# Patient Record
Sex: Male | Born: 1965
Health system: Southern US, Community
[De-identification: ages and names within clinical notes are randomized; demographics above are authoritative.]

## PROBLEM LIST (undated history)

## (undated) DIAGNOSIS — K219 Gastro-esophageal reflux disease without esophagitis: Secondary | ICD-10-CM

## (undated) DIAGNOSIS — I82409 Acute embolism and thrombosis of unspecified deep veins of unspecified lower extremity: Secondary | ICD-10-CM

## (undated) DIAGNOSIS — I10 Essential (primary) hypertension: Secondary | ICD-10-CM

## (undated) HISTORY — DX: Essential (primary) hypertension: I10

## (undated) HISTORY — DX: Gastro-esophageal reflux disease without esophagitis: K21.9

## (undated) HISTORY — DX: Acute embolism and thrombosis of unspecified deep veins of unspecified lower extremity: I82.409

## (undated) HISTORY — PX: EYE SURGERY: SHX253

---

## 2001-11-29 ENCOUNTER — Encounter: Payer: Self-pay | Admitting: Internal Medicine

## 2001-11-29 ENCOUNTER — Ambulatory Visit (HOSPITAL_COMMUNITY): Admission: RE | Admit: 2001-11-29 | Discharge: 2001-11-29 | Payer: Self-pay | Admitting: Internal Medicine

## 2001-12-02 ENCOUNTER — Ambulatory Visit (HOSPITAL_COMMUNITY): Admission: RE | Admit: 2001-12-02 | Discharge: 2001-12-02 | Payer: Self-pay | Admitting: Internal Medicine

## 2001-12-02 ENCOUNTER — Encounter: Payer: Self-pay | Admitting: Internal Medicine

## 2002-07-25 ENCOUNTER — Ambulatory Visit (HOSPITAL_COMMUNITY): Admission: RE | Admit: 2002-07-25 | Discharge: 2002-07-25 | Payer: Self-pay | Admitting: Internal Medicine

## 2002-07-25 ENCOUNTER — Encounter: Payer: Self-pay | Admitting: Internal Medicine

## 2004-11-05 ENCOUNTER — Emergency Department (HOSPITAL_COMMUNITY): Admission: EM | Admit: 2004-11-05 | Discharge: 2004-11-05 | Payer: Self-pay | Admitting: Emergency Medicine

## 2004-12-30 ENCOUNTER — Encounter: Payer: Self-pay | Admitting: Orthopedic Surgery

## 2004-12-30 ENCOUNTER — Emergency Department (HOSPITAL_COMMUNITY): Admission: EM | Admit: 2004-12-30 | Discharge: 2004-12-30 | Payer: Self-pay | Admitting: Emergency Medicine

## 2005-01-01 ENCOUNTER — Ambulatory Visit: Payer: Self-pay | Admitting: Orthopedic Surgery

## 2005-01-14 ENCOUNTER — Ambulatory Visit: Payer: Self-pay | Admitting: Orthopedic Surgery

## 2005-01-20 ENCOUNTER — Encounter (HOSPITAL_COMMUNITY): Admission: RE | Admit: 2005-01-20 | Discharge: 2005-02-19 | Payer: Self-pay | Admitting: Orthopedic Surgery

## 2005-01-28 ENCOUNTER — Ambulatory Visit: Payer: Self-pay | Admitting: Orthopedic Surgery

## 2009-02-27 ENCOUNTER — Ambulatory Visit: Payer: Self-pay | Admitting: Orthopedic Surgery

## 2009-02-27 ENCOUNTER — Encounter (INDEPENDENT_AMBULATORY_CARE_PROVIDER_SITE_OTHER): Payer: Self-pay | Admitting: *Deleted

## 2009-02-27 DIAGNOSIS — IMO0002 Reserved for concepts with insufficient information to code with codable children: Secondary | ICD-10-CM

## 2009-03-01 ENCOUNTER — Encounter: Payer: Self-pay | Admitting: Orthopedic Surgery

## 2009-03-04 ENCOUNTER — Telehealth: Payer: Self-pay | Admitting: Orthopedic Surgery

## 2009-03-05 ENCOUNTER — Ambulatory Visit: Payer: Self-pay | Admitting: Orthopedic Surgery

## 2009-03-22 ENCOUNTER — Encounter: Payer: Self-pay | Admitting: Orthopedic Surgery

## 2009-03-26 ENCOUNTER — Encounter: Payer: Self-pay | Admitting: Orthopedic Surgery

## 2009-08-26 ENCOUNTER — Ambulatory Visit (HOSPITAL_COMMUNITY): Admission: RE | Admit: 2009-08-26 | Discharge: 2009-08-26 | Payer: Self-pay | Admitting: Internal Medicine

## 2009-08-26 ENCOUNTER — Encounter: Payer: Self-pay | Admitting: Orthopedic Surgery

## 2009-08-27 ENCOUNTER — Ambulatory Visit: Payer: Self-pay | Admitting: Orthopedic Surgery

## 2009-08-27 DIAGNOSIS — S92009A Unspecified fracture of unspecified calcaneus, initial encounter for closed fracture: Secondary | ICD-10-CM

## 2009-08-30 ENCOUNTER — Telehealth: Payer: Self-pay | Admitting: Orthopedic Surgery

## 2009-08-30 ENCOUNTER — Encounter: Payer: Self-pay | Admitting: Orthopedic Surgery

## 2009-09-03 ENCOUNTER — Ambulatory Visit (HOSPITAL_COMMUNITY): Admission: RE | Admit: 2009-09-03 | Discharge: 2009-09-03 | Payer: Self-pay | Admitting: Orthopedic Surgery

## 2009-09-10 ENCOUNTER — Ambulatory Visit: Payer: Self-pay | Admitting: Orthopedic Surgery

## 2009-09-10 DIAGNOSIS — S93409A Sprain of unspecified ligament of unspecified ankle, initial encounter: Secondary | ICD-10-CM | POA: Insufficient documentation

## 2009-09-11 ENCOUNTER — Encounter: Payer: Self-pay | Admitting: Orthopedic Surgery

## 2009-09-24 ENCOUNTER — Ambulatory Visit: Payer: Self-pay | Admitting: Orthopedic Surgery

## 2009-11-01 ENCOUNTER — Encounter: Payer: Self-pay | Admitting: Orthopedic Surgery

## 2009-11-08 ENCOUNTER — Encounter: Payer: Self-pay | Admitting: Orthopedic Surgery

## 2010-10-28 NOTE — Letter (Signed)
Summary: [employer] response claim ref  [facility] [employer] Medical response claim ref   Imported By: Cammie Sickle 03/22/2010 13:19:32  _____________________________________________________________________  External Attachment:    Type:   Image     Comment:   External Document

## 2010-10-28 NOTE — Letter (Signed)
Summary: [employer] disability ins form  [employer] disability ins form   Imported By: Cammie Sickle 01/20/2010 08:59:40  _____________________________________________________________________  External Attachment:    Type:   Image     Comment:   External Document

## 2010-10-28 NOTE — Letter (Signed)
Summary: [employer] disability insurance form  [employer] disability insurance form   Imported By: Cammie Sickle 11/21/2009 10:53:41  _____________________________________________________________________  External Attachment:    Type:   Image     Comment:   External Document

## 2011-01-13 IMAGING — CT CT EXTREM LOW W/O CM*R*
2 of 4 series · 7 of 35 positions shown, 8 images · non-contrast
Comparison: Right ankle radiographs 08/26/2009.

CLINICAL DATA: Right ankle and foot pain since injury 08/23/2009.

CT OF THE RIGHT FOOT WITHOUT CONTRAST
TECHNIQUE: Multidetector CT imaging of the right foot and ankle
was performed according to the standard protocol without
intravenous contrast. Multiplanar CT image reconstructions were
also generated.

[Series 12: axial to pacs 2.0 · coronal · 0.26mm/px · 3 of 144 slices shown]
[im 41/144  bone]
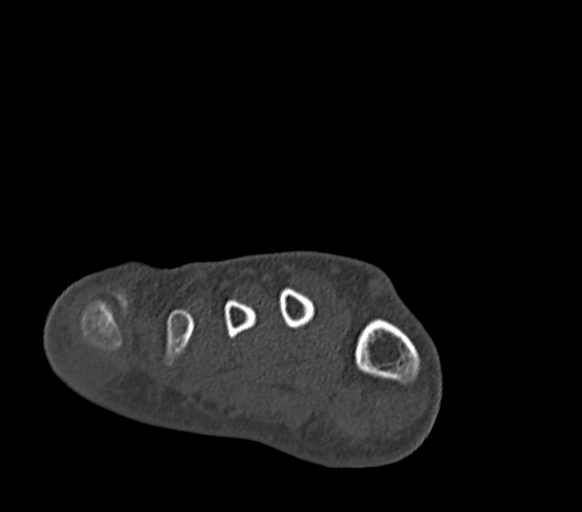
[im 72/144  bone]
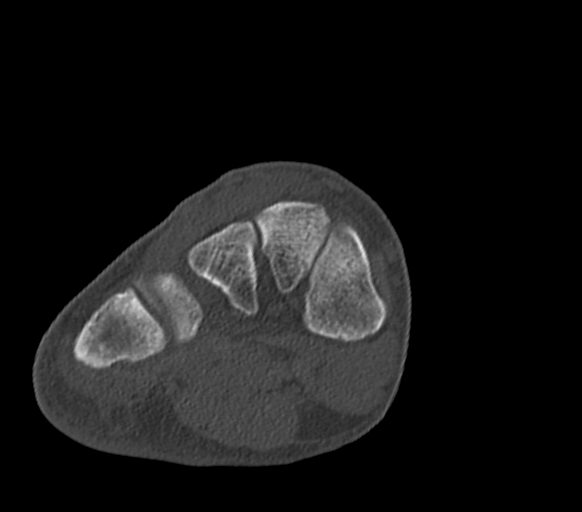
[im 104/144  bone]
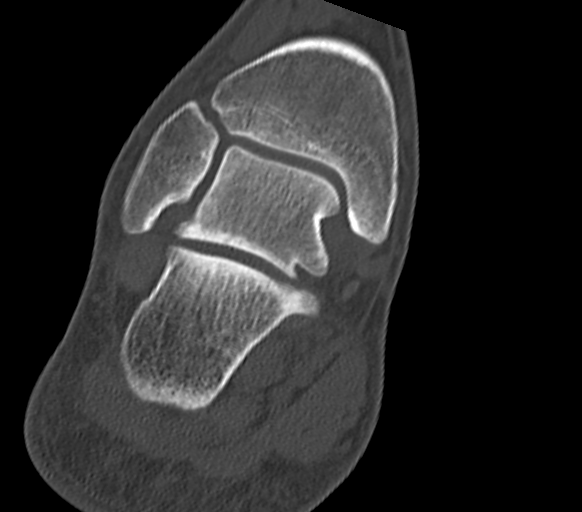

[Series 14: mpr coro bone to pacs 2.0 · axial · 0.20mm/px · z∈[-239,-165]mm · 4 of 61 slices shown, 5 images]
[im 10/61  soft-tissue]
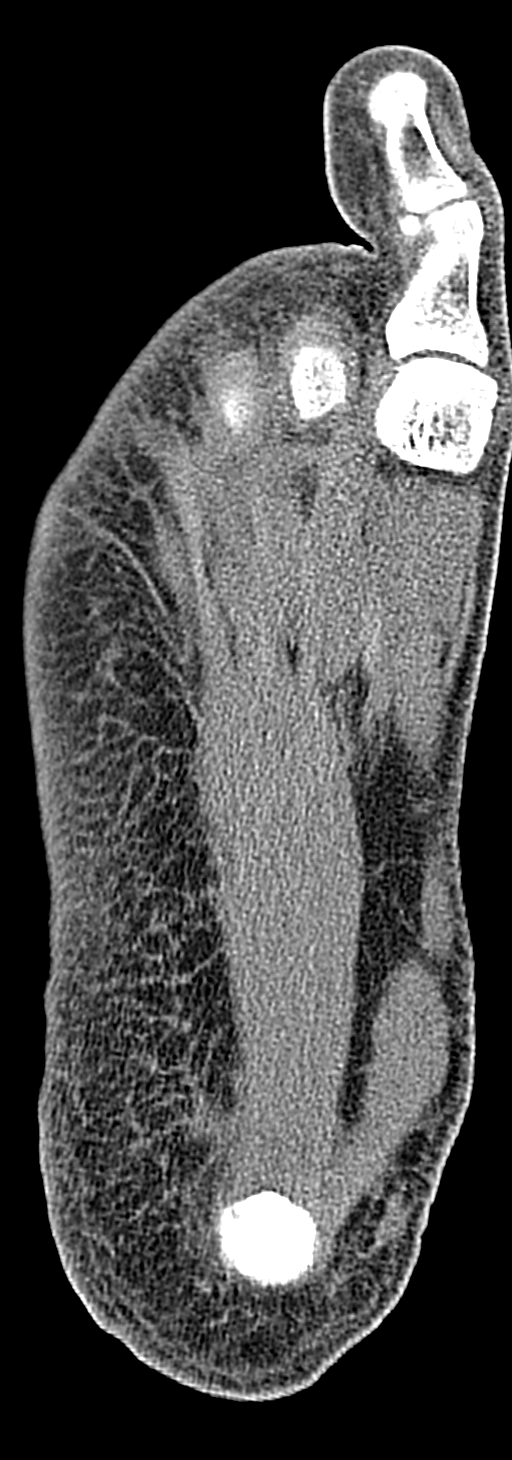
[im 10/61  bone]
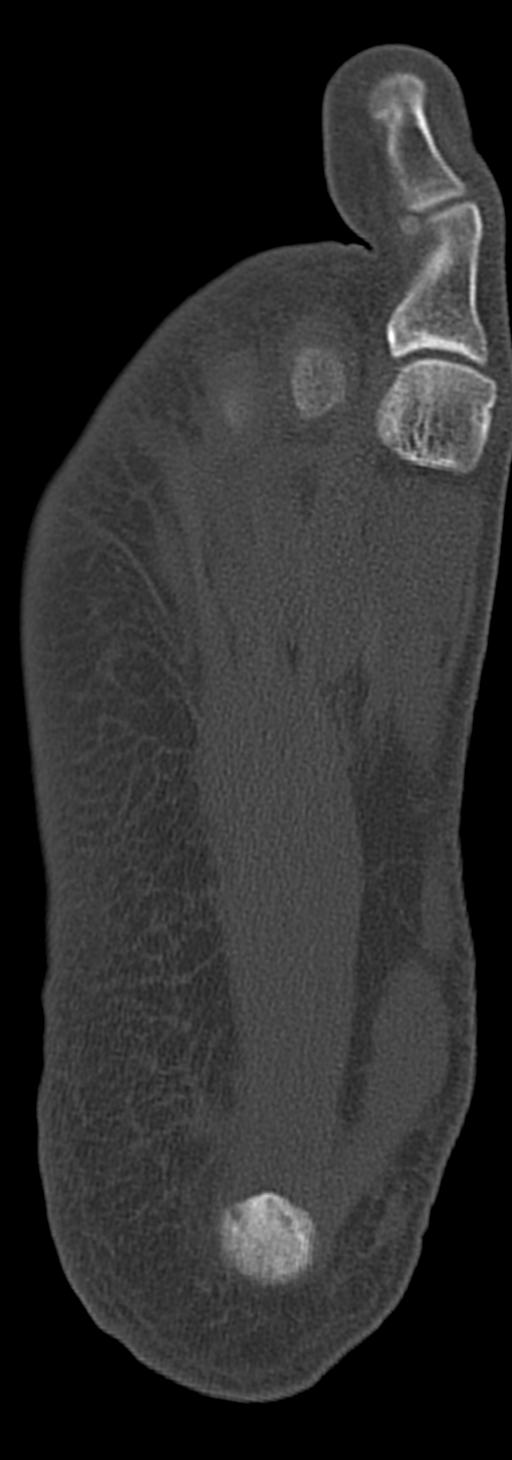
[im 24/61  bone]
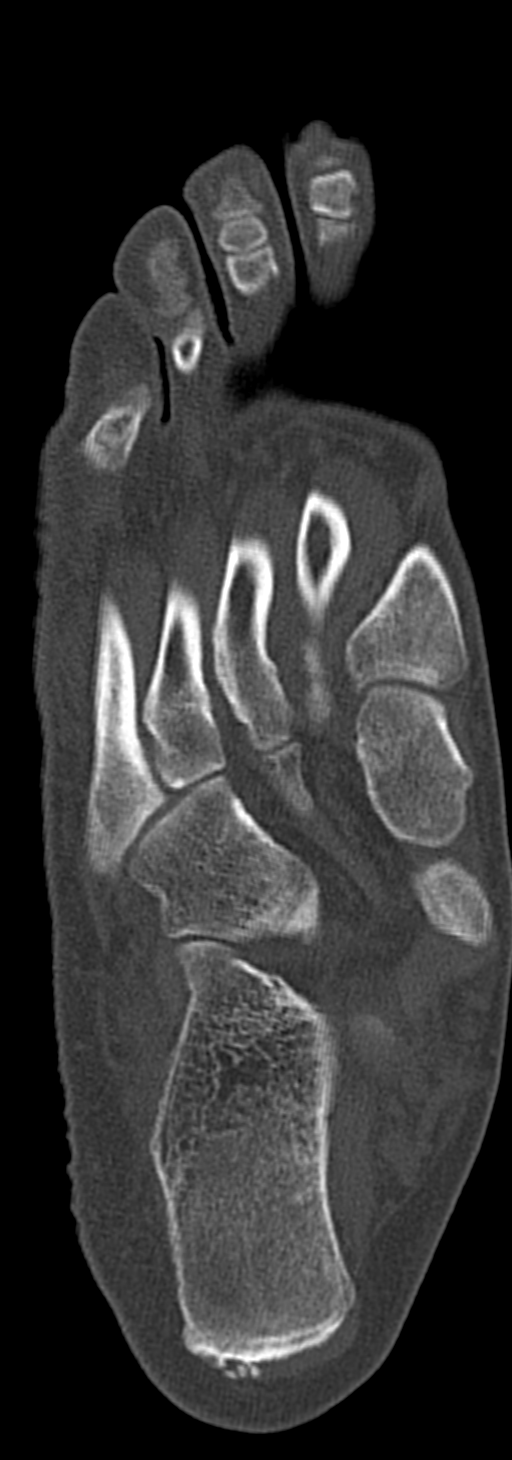
[im 37/61  bone]
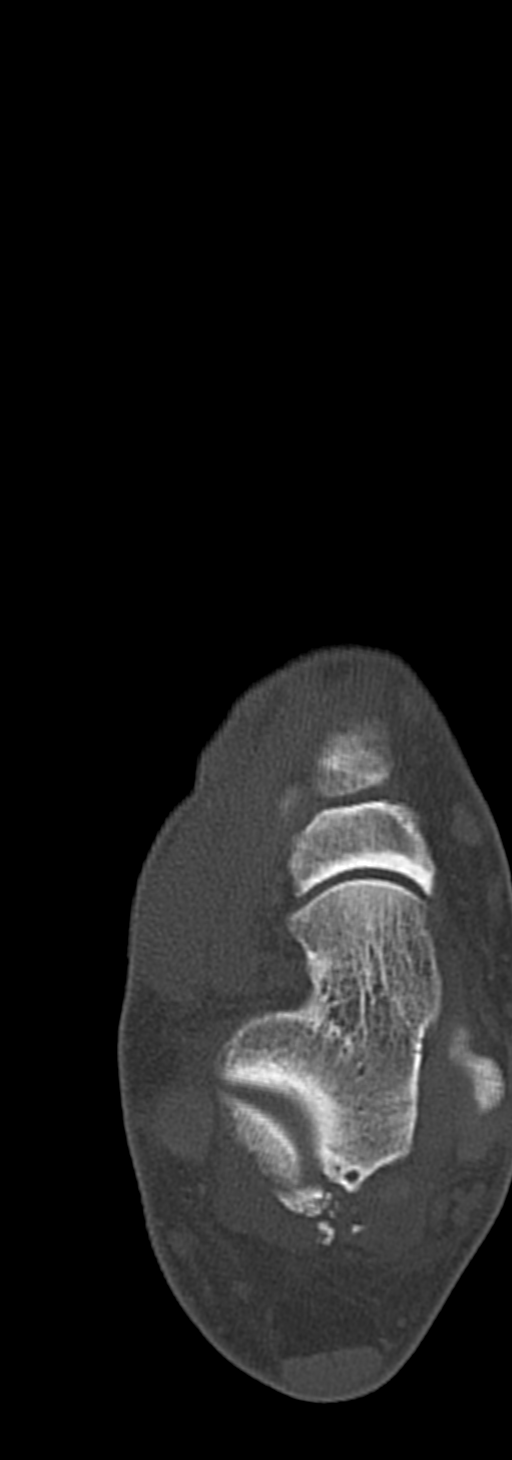
[im 51/61  bone]
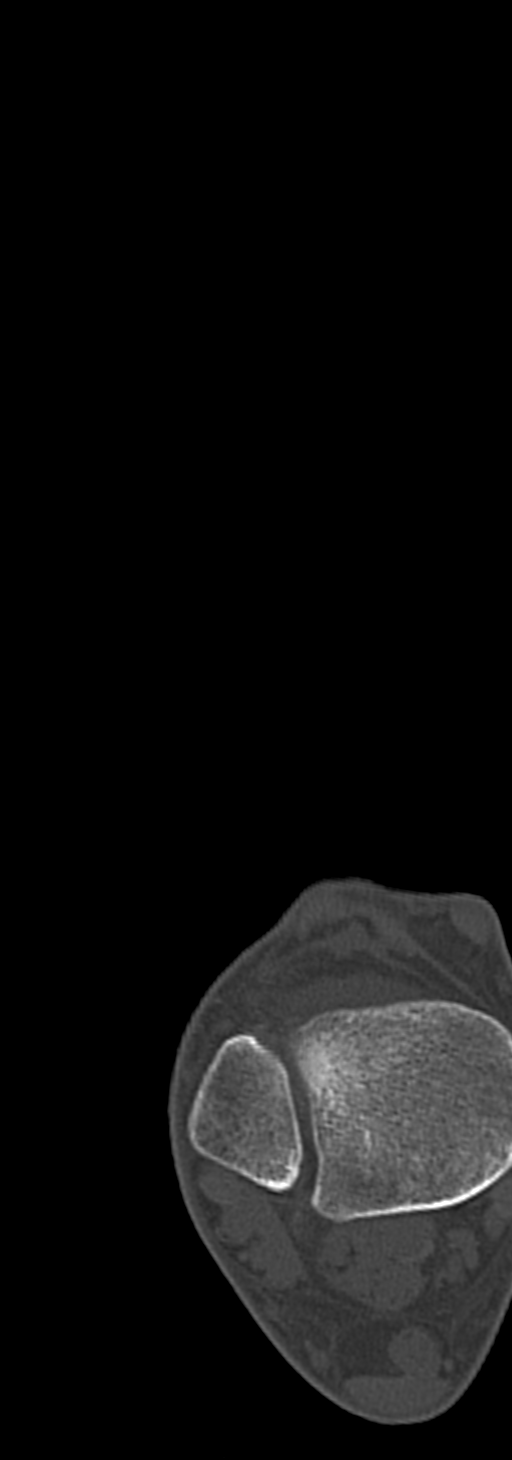

[7 of 35 positions shown; findings below may reference images not displayed]

FINDINGS: As demonstrated on the radiographs, there are multiple
ossific densities lying posterior to the posterior facet of the
subtalar joint.  The largest component laterally has the typical
appearance of an os trigonum.  Some of the smaller medial
components are angulated and suspicious for small avulsion fracture
fragments in the posterior recess.  An obvious donor site is not
demonstrated.  There is cortical irregularity of the posterior
nonweight bearing aspect of the talus, best seen on the sagittal
images.  However, this has cystic components and is not clearly
acute.

The subtalar joint, talar dome and tibial plafond appear normal.
The calcaneal tubercle is slightly fragmented.  There is a tiny
ossific density along the anterior aspect of the distal fibula
suspicious for a small avulsion fracture at the anterior
talofibular ligament attachment.  The ligament appears slightly
thickened on the soft tissue windows.

There is a possible tiny avulsion fracture from the plantar base of
the fifth metatarsal, best seen on the sagittal reformatted images.
This is medial to the peroneus brevis insertion and may not be
acute.  No other osseous abnormalities are identified within the
forefoot.  The Lisfranc alignment is normal.
IMPRESSION: 1.  Some of the ossific densities lying posterior to the posterior
facet of the subtalar joint are angulated and suspicious for
avulsion fracture fragments in the posterior recess.  An obvious
donor site is not identified.  A multicentric os peroneum is
considered less likely.
2.  Possible small avulsion fracture anteriorly from the distal
fibula at the anterior talofibular ligament attachment.
3.  Additional possible tiny avulsion fractures from the base of
the fifth metatarsal and the calcaneal tubercle; correlation with
areas of pain is recommended.

## 2013-09-28 HISTORY — PX: SHOULDER SURGERY: SHX246

## 2014-06-07 ENCOUNTER — Ambulatory Visit (INDEPENDENT_AMBULATORY_CARE_PROVIDER_SITE_OTHER): Payer: BC Managed Care – PPO | Admitting: Orthopedic Surgery

## 2014-06-07 ENCOUNTER — Ambulatory Visit (INDEPENDENT_AMBULATORY_CARE_PROVIDER_SITE_OTHER): Payer: BC Managed Care – PPO

## 2014-06-07 VITALS — BP 138/80 | Ht 68.0 in | Wt 238.2 lb

## 2014-06-07 DIAGNOSIS — Z5189 Encounter for other specified aftercare: Secondary | ICD-10-CM

## 2014-06-07 DIAGNOSIS — M25519 Pain in unspecified shoulder: Secondary | ICD-10-CM

## 2014-06-07 DIAGNOSIS — S4980XA Other specified injuries of shoulder and upper arm, unspecified arm, initial encounter: Secondary | ICD-10-CM

## 2014-06-07 DIAGNOSIS — S4992XD Unspecified injury of left shoulder and upper arm, subsequent encounter: Secondary | ICD-10-CM

## 2014-06-07 DIAGNOSIS — S46909A Unspecified injury of unspecified muscle, fascia and tendon at shoulder and upper arm level, unspecified arm, initial encounter: Secondary | ICD-10-CM

## 2014-06-07 DIAGNOSIS — M25512 Pain in left shoulder: Secondary | ICD-10-CM

## 2014-06-07 NOTE — Patient Instructions (Signed)
We will refer you to the shoulder specialist Dr Mardelle Matte
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
## 2014-06-11 ENCOUNTER — Telehealth: Payer: Self-pay | Admitting: *Deleted

## 2014-06-11 ENCOUNTER — Other Ambulatory Visit: Payer: Self-pay | Admitting: *Deleted

## 2014-06-11 DIAGNOSIS — S4992XA Unspecified injury of left shoulder and upper arm, initial encounter: Secondary | ICD-10-CM

## 2014-06-11 NOTE — Telephone Encounter (Signed)
REFERRAL SENT TO DR Mardelle Matte AND Percell Miller Noemi Chapel

## 2014-06-13 ENCOUNTER — Telehealth: Payer: Self-pay | Admitting: Orthopedic Surgery

## 2014-06-13 NOTE — Telephone Encounter (Signed)
Patient and wife called to request 2 weeks out of work to rest shoulder; referral appointment in process for evaluation by Dr Mardelle Matte.  Please advise.  [phone]

## 2014-06-15 NOTE — Telephone Encounter (Signed)
2 WEEKS

## 2014-06-18 NOTE — Telephone Encounter (Signed)
Left message for patient to return call; note approved, however need date range for note.

## 2014-06-21 NOTE — Telephone Encounter (Signed)
Patient has changed his mind regarding the 2 week out of work note; wishes to hold for now; awaiting referral appointment with shoulder specialist at [facility], which is in processing.

## 2014-07-02 NOTE — Telephone Encounter (Signed)
Patient has appointment with Dr Mardelle Matte Oct 7 10:45am

## 2015-05-02 ENCOUNTER — Encounter: Payer: Self-pay | Admitting: Internal Medicine

## 2015-05-24 ENCOUNTER — Ambulatory Visit: Payer: Self-pay | Admitting: Gastroenterology

## 2015-06-05 ENCOUNTER — Ambulatory Visit: Payer: Self-pay | Admitting: Nurse Practitioner

## 2015-06-25 ENCOUNTER — Ambulatory Visit: Payer: Self-pay | Admitting: Nurse Practitioner

## 2015-07-08 ENCOUNTER — Encounter: Payer: Self-pay | Admitting: Nurse Practitioner

## 2015-07-08 ENCOUNTER — Other Ambulatory Visit: Payer: Self-pay

## 2015-07-08 ENCOUNTER — Ambulatory Visit (INDEPENDENT_AMBULATORY_CARE_PROVIDER_SITE_OTHER): Payer: BLUE CROSS/BLUE SHIELD | Admitting: Nurse Practitioner

## 2015-07-08 VITALS — BP 155/101 | HR 86 | Temp 98.4°F | Ht 68.0 in | Wt 240.0 lb

## 2015-07-08 DIAGNOSIS — K625 Hemorrhage of anus and rectum: Secondary | ICD-10-CM | POA: Diagnosis not present

## 2015-07-08 DIAGNOSIS — K219 Gastro-esophageal reflux disease without esophagitis: Secondary | ICD-10-CM

## 2015-07-08 MED ORDER — OMEPRAZOLE 20 MG PO CPDR: 20.0000 mg | DELAYED_RELEASE_CAPSULE | Freq: Every day | ORAL | Status: DC

## 2015-07-08 MED ORDER — NA SULFATE-K SULFATE-MG SULF 17.5-3.13-1.6 GM/177ML PO SOLN: 1.0000 | ORAL | Status: DC

## 2015-07-08 NOTE — Patient Instructions (Addendum)
1. I've sent in a prescription to your pharmacy for Prilosec 20 mg free to take once a day, 30 minutes before your first meal of the day. 2. I'm including some information on acid reflux below. 3. We will schedule your procedure for you. 4. Further recommendations to be based on results of your procedure. 5. Return for follow-up in 3 months.   Gastroesophageal Reflux Disease, Adult Normally, food travels down the esophagus and stays in the stomach to be digested. However, when a person has gastroesophageal reflux disease (GERD), food and stomach acid move back up into the esophagus. When this happens, the esophagus becomes sore and inflamed. Over time, GERD can create small holes (ulcers) in the lining of the esophagus.  CAUSES This condition is caused by a problem with the muscle between the esophagus and the stomach (lower esophageal sphincter, or LES). Normally, the LES muscle closes after food passes through the esophagus to the stomach. When the LES is weakened or abnormal, it does not close properly, and that allows food and stomach acid to go back up into the esophagus. The LES can be weakened by certain dietary substances, medicines, and medical conditions, including:  Tobacco use.  Pregnancy.  Having a hiatal hernia.  Heavy alcohol use.  Certain foods and beverages, such as coffee, chocolate, onions, and peppermint. RISK FACTORS This condition is more likely to develop in:  People who have an increased body weight.  People who have connective tissue disorders.  People who use NSAID medicines. SYMPTOMS Symptoms of this condition include:  Heartburn.  Difficult or painful swallowing.  The feeling of having a lump in the throat.  Abitter taste in the mouth.  Bad breath.  Having a large amount of saliva.  Having an upset or bloated stomach.  Belching.  Chest pain.  Shortness of breath or wheezing.  Ongoing (chronic) cough or a night-time cough.  Wearing  away of tooth enamel.  Weight loss. Different conditions can cause chest pain. Make sure to see your health care provider if you experience chest pain. DIAGNOSIS Your health care provider will take a medical history and perform a physical exam. To determine if you have mild or severe GERD, your health care provider may also monitor how you respond to treatment. You may also have other tests, including:  An endoscopy toexamine your stomach and esophagus with a small camera.  A test thatmeasures the acidity level in your esophagus.  A test thatmeasures how much pressure is on your esophagus.  A barium swallow or modified barium swallow to show the shape, size, and functioning of your esophagus. TREATMENT The goal of treatment is to help relieve your symptoms and to prevent complications. Treatment for this condition may vary depending on how severe your symptoms are. Your health care provider may recommend:  Changes to your diet.  Medicine.  Surgery. HOME CARE INSTRUCTIONS Diet  Follow a diet as recommended by your health care provider. This may involve avoiding foods and drinks such as:  Coffee and tea (with or without caffeine).  Drinks that containalcohol.  Energy drinks and sports drinks.  Carbonated drinks or sodas.  Chocolate and cocoa.  Peppermint and mint flavorings.  Garlic and onions.  Horseradish.  Spicy and acidic foods, including peppers, chili powder, curry powder, vinegar, hot sauces, and barbecue sauce.  Citrus fruit juices and citrus fruits, such as oranges, lemons, and limes.  Tomato-based foods, such as red sauce, chili, salsa, and pizza with red sauce.  Fried and fatty  foods, such as donuts, french fries, potato chips, and high-fat dressings.  High-fat meats, such as hot dogs and fatty cuts of red and white meats, such as rib eye steak, sausage, ham, and bacon.  High-fat dairy items, such as whole milk, butter, and cream cheese.  Eat small,  frequent meals instead of large meals.  Avoid drinking large amounts of liquid with your meals.  Avoid eating meals during the 2-3 hours before bedtime.  Avoid lying down right after you eat.  Do not exercise right after you eat. General Instructions  Pay attention to any changes in your symptoms.  Take over-the-counter and prescription medicines only as told by your health care provider. Do not take aspirin, ibuprofen, or other NSAIDs unless your health care provider told you to do so.  Do not use any tobacco products, including cigarettes, chewing tobacco, and e-cigarettes. If you need help quitting, ask your health care provider.  Wear loose-fitting clothing. Do not wear anything tight around your waist that causes pressure on your abdomen.  Raise (elevate) the head of your bed 6 inches (15cm).  Try to reduce your stress, such as with yoga or meditation. If you need help reducing stress, ask your health care provider.  If you are overweight, reduce your weight to an amount that is healthy for you. Ask your health care provider for guidance about a safe weight loss goal.  Keep all follow-up visits as told by your health care provider. This is important. SEEK MEDICAL CARE IF:  You have new symptoms.  You have unexplained weight loss.  You have difficulty swallowing, or it hurts to swallow.  You have wheezing or a persistent cough.  Your symptoms do not improve with treatment.  You have a hoarse voice. SEEK IMMEDIATE MEDICAL CARE IF:  You have pain in your arms, neck, jaw, teeth, or back.  You feel sweaty, dizzy, or light-headed.  You have chest pain or shortness of breath.  You vomit and your vomit looks like blood or coffee grounds.  You faint.  Your stool is bloody or black.  You cannot swallow, drink, or eat.   This information is not intended to replace advice given to you by your health care provider. Make sure you discuss any questions you have with  your health care provider.   Document Released: 06/24/2005 Document Revised: 06/05/2015 Document Reviewed: 01/09/2015 Elsevier Interactive Patient Education 2016 [place] for Gastroesophageal Reflux Disease, Adult When you have gastroesophageal reflux disease (GERD), the foods you eat and your eating habits are very important. Choosing the right foods can help ease the discomfort of GERD. WHAT GENERAL GUIDELINES DO I NEED TO FOLLOW?  Choose fruits, vegetables, whole grains, low-fat dairy products, and low-fat meat, fish, and poultry.  Limit fats such as oils, salad dressings, butter, nuts, and avocado.  Keep a food diary to identify foods that cause symptoms.  Avoid foods that cause reflux. These may be different for different people.  Eat frequent small meals instead of three large meals each day.  Eat your meals slowly, in a relaxed setting.  Limit fried foods.  Cook foods using methods other than frying.  Avoid drinking alcohol.  Avoid drinking large amounts of liquids with your meals.  Avoid bending over or lying down until 2-3 hours after eating. WHAT FOODS ARE NOT RECOMMENDED? The following are some foods and drinks that may worsen your symptoms: Vegetables Tomatoes. Tomato juice. Tomato and spaghetti sauce. Chili peppers. Onion and  garlic. Horseradish. Fruits Oranges, grapefruit, and lemon (fruit and juice). Meats High-fat meats, fish, and poultry. This includes hot dogs, ribs, ham, sausage, salami, and bacon. Dairy Whole milk and chocolate milk. Sour cream. Cream. Butter. Ice cream. Cream cheese.  Beverages Coffee and tea, with or without caffeine. Carbonated beverages or energy drinks. Condiments Hot sauce. Barbecue sauce.  Sweets/Desserts Chocolate and cocoa. Donuts. Peppermint and spearmint. Fats and Oils High-fat foods, including [place] fries and potato chips. Other Vinegar. Strong spices, such as black pepper, white pepper, red  pepper, cayenne, curry powder, cloves, ginger, and chili powder. The items listed above may not be a complete list of foods and beverages to avoid. Contact your dietitian for more information.   This information is not intended to replace advice given to you by your health care provider. Make sure you discuss any questions you have with your health care provider.   Document Released: 09/14/2005 Document Revised: 10/05/2014 Document Reviewed: 07/19/2013 Elsevier Interactive Patient Education [employer].

## 2015-07-08 NOTE — Assessment & Plan Note (Signed)
Patient with bright red rectal bleeding, heme positive stool per PCP note. This lasted 1-2 weeks. At that time he was noting some increased straining as well as prolonged sitting on the commode. When he began having his symptoms she stopped sitting as long and symptoms have resolved. He is overdue for initial screening colonoscopy. At this point we'll proceed with diagnostic colonoscopy to evaluate his symptoms which also service initial screening. Return for follow-up in 3 months  Proceed with colonoscopy with Dr. Oneida Alar in the near future. The risks, benefits, and alternatives have been discussed in detail with the patient. They state understanding and desire to proceed.   The patient is not on any anticoagulants, anxiolytics, chronic pain medications, or antidepressants. Rare alcohol use, denies drug use. Conscious sedation should be adequate for his procedure.

## 2015-07-08 NOTE — Assessment & Plan Note (Signed)
Patient with a history of GERD which is typically been intermittent. However he notes worsening symptoms. Previously his symptoms only occurred with eating certain foods however now they're occurring more regularly and he is questioning if there is something he can take to help. We will trial him on Prilosec 20 mg once a day, provide GERD instructions, and return for follow-up in 3 months.

## 2015-07-08 NOTE — Progress Notes (Signed)
Primary Care Physician:  Asencion Noble, MD Primary Gastroenterologist:  Dr. Oneida Alar  Chief Complaint  Patient presents with  . Rectal Bleeding    HPI:   49 year old male presents on referral from PCP for rectal bleeding. PCP notes reviewed. Patient last seen by his primary care provider on 04/30/2015 with noted bright red blood in stool for 2 days. At that time he denied rectal pain, abdominal pain, history of hemorrhoids, no known family history of colon cancer. Rectal exam done by PCP noted heme positive stool with no visible external hemorrhoids. He was subsequently referred. Of note he is approximately 1 Year away from do an initial screening colonoscopy. Per PCP note CBC normal last year, no lab values included with visit notes.  Today he states he continued having bleeding for about an additional week after seeing PCP. At that point it self-resolved without further recurrence. His bleeding was bright red, after passing a stool, and was "dripping into the water." Also with toilet tissue hematochezia. At the time he states his stools were soft and formed but with some straining and prolonged sitting. When he noted the bleeding he started sitting for a minimal amount of time and that is about when he feels the bleeding stopped. Denies abdominal pain, rectal pain, rectal irritation, melena, fever, chills. Will have some occasional nausea with late night eating. Is also having more regular GERD symptoms.  Stools consistent with [place] 4. Denies fever, chills, unintentional weight loss, other changes in bowel habits. Denies chest pain, dyspnea, dizziness, lightheadedness, syncope, near syncope. Denies any other upper or lower GI symptoms.  Past Medical History  Diagnosis Date  . Hypertension     Past Surgical History  Procedure Laterality Date  . Shoulder surgery Left 2015    fix previous clavicular fracture complication    Current Outpatient Prescriptions  Medication Sig Dispense Refill    . amLODipine (NORVASC) 10 MG tablet Take 10 mg by mouth daily.    . tadalafil (CIALIS) 20 MG tablet Take 20 mg by mouth daily as needed for erectile dysfunction.    Marland Kitchen omeprazole (PRILOSEC) 20 MG capsule Take 1 capsule (20 mg total) by mouth daily. 30 capsule 2   No current facility-administered medications for this visit.    Allergies as of 07/08/2015  . (No Known Allergies)    Family History  Problem Relation Age of Onset  . Colon cancer Neg Hx     Social History   Social History  . Marital Status: Married    Spouse Name: N/A  . Number of Children: N/A  . Years of Education: N/A   Occupational History  . Not on file.   Social History Main Topics  . Smoking status: Light Tobacco Smoker    Types: Cigars  . Smokeless tobacco: Never Used     Comment: weekend / social  . Alcohol Use: 0.0 oz/week    0 Standard drinks or equivalent per week     Comment: social, every 1-2 months approx 1 drink per occasion  . Drug Use: No  . Sexual Activity: Not on file   Other Topics Concern  . Not on file   Social History Narrative  . No narrative on file    Review of Systems: 10-point ROS negative except as per HPI.    Physical Exam: BP 155/101 mmHg  Pulse 86  Temp(Src) 98.4 F (36.9 C)  Ht 5\' 8"  (1.727 m)  Wt 240 lb (108.863 kg)  BMI 36.50 kg/m2 General:  Alert and oriented. Pleasant and cooperative. Well-nourished and well-developed.  Head:  Normocephalic and atraumatic. Eyes:  Without icterus, sclera clear and conjunctiva pink.  Ears:  Normal auditory acuity. Cardiovascular:  S1, S2 present without murmurs appreciated. Normal PT pulses noted. Extremities without clubbing or edema. Respiratory:  Clear to auscultation bilaterally. No wheezes, rales, or rhonchi. No distress.  Gastrointestinal:  +BS, rounded but soft, non-tender and non-distended. No HSM noted. No guarding or rebound. No masses appreciated.  Rectal:  Deferred  Skin:  Intact without significant lesions  or rashes. Neurologic:  Alert and oriented x4;  grossly normal neurologically. Psych:  Alert and cooperative. Normal mood and affect.    07/08/2015 2:25 PM

## 2015-07-09 NOTE — Progress Notes (Signed)
CC'ED TO PCP 

## 2015-07-18 ENCOUNTER — Other Ambulatory Visit: Payer: Self-pay

## 2015-07-18 MED ORDER — PEG 3350-KCL-NA BICARB-NACL 420 G PO SOLR: 4000.0000 mL | Freq: Once | ORAL | Status: DC

## 2015-07-22 ENCOUNTER — Ambulatory Visit (HOSPITAL_COMMUNITY)
Admission: RE | Admit: 2015-07-22 | Discharge: 2015-07-22 | Disposition: A | Discharge status: Home / Self Care | Payer: BLUE CROSS/BLUE SHIELD | Source: Ambulatory Visit | Attending: Gastroenterology | Admitting: Gastroenterology

## 2015-07-22 ENCOUNTER — Encounter (HOSPITAL_COMMUNITY): Payer: Self-pay

## 2015-07-22 ENCOUNTER — Encounter (HOSPITAL_COMMUNITY)
Admission: RE | Disposition: A | Discharge status: Home / Self Care | Payer: Self-pay | Source: Ambulatory Visit | Attending: Gastroenterology

## 2015-07-22 DIAGNOSIS — I10 Essential (primary) hypertension: Secondary | ICD-10-CM | POA: Insufficient documentation

## 2015-07-22 DIAGNOSIS — D125 Benign neoplasm of sigmoid colon: Secondary | ICD-10-CM

## 2015-07-22 DIAGNOSIS — D123 Benign neoplasm of transverse colon: Secondary | ICD-10-CM | POA: Insufficient documentation

## 2015-07-22 DIAGNOSIS — D122 Benign neoplasm of ascending colon: Secondary | ICD-10-CM

## 2015-07-22 DIAGNOSIS — F17290 Nicotine dependence, other tobacco product, uncomplicated: Secondary | ICD-10-CM | POA: Diagnosis not present

## 2015-07-22 DIAGNOSIS — K5730 Diverticulosis of large intestine without perforation or abscess without bleeding: Secondary | ICD-10-CM | POA: Diagnosis not present

## 2015-07-22 DIAGNOSIS — K648 Other hemorrhoids: Secondary | ICD-10-CM | POA: Insufficient documentation

## 2015-07-22 DIAGNOSIS — K625 Hemorrhage of anus and rectum: Secondary | ICD-10-CM | POA: Insufficient documentation

## 2015-07-22 DIAGNOSIS — Z79899 Other long term (current) drug therapy: Secondary | ICD-10-CM | POA: Insufficient documentation

## 2015-07-22 HISTORY — PX: COLONOSCOPY: SHX5424

## 2015-07-22 SURGERY — COLONOSCOPY
Anesthesia: Moderate Sedation

## 2015-07-22 MED ORDER — SODIUM CHLORIDE 0.9 % IV SOLN
INTRAVENOUS | Status: DC
Start: 2015-07-22 — End: 2015-07-22
  Administered 2015-07-22: 08:00:00 via INTRAVENOUS

## 2015-07-22 MED ORDER — MEPERIDINE HCL 100 MG/ML IJ SOLN
INTRAMUSCULAR | Status: AC
  Filled 2015-07-22: qty 2

## 2015-07-22 MED ORDER — MIDAZOLAM HCL 5 MG/5ML IJ SOLN
INTRAMUSCULAR | Status: AC
  Filled 2015-07-22: qty 10

## 2015-07-22 MED ORDER — SIMETHICONE 40 MG/0.6ML PO SUSP
ORAL | Status: DC | PRN
  Administered 2015-07-22: 09:00:00

## 2015-07-22 MED ORDER — MIDAZOLAM HCL 5 MG/5ML IJ SOLN
INTRAMUSCULAR | Status: DC | PRN
  Administered 2015-07-22: 2 mg via INTRAVENOUS
  Administered 2015-07-22: 1 mg via INTRAVENOUS
  Administered 2015-07-22: 2 mg via INTRAVENOUS

## 2015-07-22 MED ORDER — MEPERIDINE HCL 100 MG/ML IJ SOLN
INTRAMUSCULAR | Status: DC | PRN
  Administered 2015-07-22 (×3): 25 mg via INTRAVENOUS

## 2015-07-22 NOTE — Discharge Instructions (Signed)
YOUR RECTAL BLEEDING IS DUE TO INTERNAL HEMORRHOIDS. You had 7 polyps removed. You have moderate size internal hemorrhoids and diverticulosis IN YOUR LEFT COLON.    FOLLOW A HIGH FIBER DIET. AVOID ITEMS THAT CAUSE BLOATING. SEE INFO BELOW.  USE PREPARATION H 2 TO 4 TIMES A DAY AS NEEDED TO RELIEVE RECTAL PRESSURE/ITCHING/BLEEDING.  YOUR BIOPSY RESULTS WILL BE AVAILABLE IN MY CHART AFTER OCT 25 AND MY OFFICE WILL CONTACT YOU IN 10-14 DAYS WITH YOUR RESULTS.   Next colonoscopy in 1-3 years. YOUR SISTERS, BROTHERS, CHILDREN, AND PARENTS NEED TO HAVE A COLONOSCOPY STARTING AT THE AGE OF 40.      Colonoscopy Care After Read the instructions outlined below and refer to this sheet in the next week. These discharge instructions provide you with general information on caring for yourself after you leave the hospital. While your treatment has been planned according to the most current medical practices available, unavoidable complications occasionally occur. If you have any problems or questions after discharge, call DR. Majel Giel, [phone].  ACTIVITY  You may resume your regular activity, but move at a slower pace for the next 24 hours.   Take frequent rest periods for the next 24 hours.   Walking will help get rid of the air and reduce the bloated feeling in your belly (abdomen).   No driving for 24 hours (because of the medicine (anesthesia) used during the test).   You may shower.   Do not sign any important legal documents or operate any machinery for 24 hours (because of the anesthesia used during the test).    NUTRITION  Drink plenty of fluids.   You may resume your normal diet as instructed by your doctor.   Begin with a light meal and progress to your normal diet. Heavy or fried foods are harder to digest and may make you feel sick to your stomach (nauseated).   Avoid alcoholic beverages for 24 hours or as instructed.    MEDICATIONS  You may resume your normal  medications.   WHAT YOU CAN EXPECT TODAY  Some feelings of bloating in the abdomen.   Passage of more gas than usual.   Spotting of blood in your stool or on the toilet paper  .  IF YOU HAD POLYPS REMOVED DURING THE COLONOSCOPY:  Eat a soft diet IF YOU HAVE NAUSEA, BLOATING, ABDOMINAL PAIN, OR VOMITING.    FINDING OUT THE RESULTS OF YOUR TEST Not all test results are available during your visit. DR. Oneida Alar WILL CALL YOU WITHIN 14 DAYS OF YOUR PROCEDUE WITH YOUR RESULTS. Do not assume everything is normal if you have not heard from DR. Havard Radigan, CALL HER OFFICE AT [phone].  SEEK IMMEDIATE MEDICAL ATTENTION AND CALL THE OFFICE: [phone] IF:  You have more than a spotting of blood in your stool.   Your belly is swollen (abdominal distention).   You are nauseated or vomiting.   You have a temperature over 101F.   You have abdominal pain or discomfort that is severe or gets worse throughout the day.  Polyps, Colon  A polyp is extra tissue that grows inside your body. Colon polyps grow in the large intestine. The large intestine, also called the colon, is part of your digestive system. It is a long, hollow tube at the end of your digestive tract where your body makes and stores stool. Most polyps are not dangerous. They are benign. This means they are not cancerous. But over time, some types of polyps can turn  into cancer. Polyps that are smaller than a pea are usually not harmful. But larger polyps could someday become or may already be cancerous. To be safe, doctors remove all polyps and test them.   WHO GETS POLYPS? Anyone can get polyps, but certain people are more likely than others. You may have a greater chance of getting polyps if:  You are over 50.   You have had polyps before.   Someone in your family has had polyps.   Someone in your family has had cancer of the large intestine.   Find out if someone in your family has had polyps. You may also be more likely  to get polyps if you:   Eat a lot of fatty foods   Smoke   Drink alcohol   Do not exercise  Eat too much   TREATMENT  The caregiver will remove the polyp during sigmoidoscopy or colonoscopy.   PREVENTION There is not one sure way to prevent polyps. You might be able to lower your risk of getting them if you:  Eat more fruits and vegetables and less fatty food.   Do not smoke.   Avoid alcohol.   Exercise every day.   Lose weight if you are overweight.   Eating more calcium and folate can also lower your risk of getting polyps. Some foods that are rich in calcium are milk, cheese, and broccoli. Some foods that are rich in folate are chickpeas, kidney beans, and spinach.   High-Fiber Diet A high-fiber diet changes your normal diet to include more whole grains, legumes, fruits, and vegetables. Changes in the diet involve replacing refined carbohydrates with unrefined foods. The calorie level of the diet is essentially unchanged. The Dietary Reference Intake (recommended amount) for adult males is 38 grams per day. For adult females, it is 25 grams per day. Pregnant and lactating women should consume 28 grams of fiber per day. Fiber is the intact part of a plant that is not broken down during digestion. Functional fiber is fiber that has been isolated from the plant to provide a beneficial effect in the body. PURPOSE  Increase stool bulk.   Ease and regulate bowel movements.   Lower cholesterol.  REDUCE RISK OF COLON CANCER  INDICATIONS THAT YOU NEED MORE FIBER  Constipation and hemorrhoids.   Uncomplicated diverticulosis (intestine condition) and irritable bowel syndrome.   Weight management.   As a protective measure against hardening of the arteries (atherosclerosis), diabetes, and cancer.   GUIDELINES FOR INCREASING FIBER IN THE DIET  Start adding fiber to the diet slowly. A gradual increase of about 5 more grams (2 slices of whole-wheat bread, 2 servings of most  fruits or vegetables, or 1 bowl of high-fiber cereal) per day is best. Too rapid an increase in fiber may result in constipation, flatulence, and bloating.   Drink enough water and fluids to keep your urine clear or pale yellow. Water, juice, or caffeine-free drinks are recommended. Not drinking enough fluid may cause constipation.   Eat a variety of high-fiber foods rather than one type of fiber.   Try to increase your intake of fiber through using high-fiber foods rather than fiber pills or supplements that contain small amounts of fiber.   The goal is to change the types of food eaten. Do not supplement your present diet with high-fiber foods, but replace foods in your present diet.   INCLUDE A VARIETY OF FIBER SOURCES  Replace refined and processed grains with whole grains, canned  fruits with fresh fruits, and incorporate other fiber sources. White rice, white breads, and most bakery goods contain little or no fiber.   Brown whole-grain rice, buckwheat oats, and many fruits and vegetables are all good sources of fiber. These include: broccoli, Brussels sprouts, cabbage, cauliflower, beets, sweet potatoes, white potatoes (skin on), carrots, tomatoes, eggplant, squash, berries, fresh fruits, and dried fruits.   Cereals appear to be the richest source of fiber. Cereal fiber is found in whole grains and bran. Bran is the fiber-rich outer coat of cereal grain, which is largely removed in refining. In whole-grain cereals, the bran remains. In breakfast cereals, the largest amount of fiber is found in those with "bran" in their names. The fiber content is sometimes indicated on the label.   You may need to include additional fruits and vegetables each day.   In baking, for 1 cup white flour, you may use the following substitutions:   1 cup whole-wheat flour minus 2 tablespoons.   1/2 cup white flour plus 1/2 cup whole-wheat flour.   Diverticulosis Diverticulosis is a common condition that  develops when small pouches (diverticula) form in the wall of the colon. The risk of diverticulosis increases with age. It happens more often in people who eat a low-fiber diet. Most individuals with diverticulosis have no symptoms. Those individuals with symptoms usually experience belly (abdominal) pain, constipation, or loose stools (diarrhea).  HOME CARE INSTRUCTIONS  Increase the amount of fiber in your diet as directed by your caregiver or dietician. This may reduce symptoms of diverticulosis.   Drink at least 6 to 8 glasses of water each day to prevent constipation.   Try not to strain when you have a bowel movement.   Avoiding nuts and seeds to prevent complications is NOT NECESSARY.   FOODS HAVING HIGH FIBER CONTENT INCLUDE:  Fruits. Apple, peach, pear, tangerine, raisins, prunes.   Vegetables. Brussels sprouts, asparagus, broccoli, cabbage, carrot, cauliflower, romaine lettuce, spinach, summer squash, tomato, winter squash, zucchini.   Starchy Vegetables. Baked beans, kidney beans, lima beans, split peas, lentils, potatoes (with skin).   Grains. Whole wheat bread, brown rice, bran flake cereal, plain oatmeal, white rice, shredded wheat, bran muffins.   SEEK IMMEDIATE MEDICAL CARE IF:  You develop increasing pain or severe bloating.   You have an oral temperature above 101F.   You develop vomiting or bowel movements that are bloody or black.   Hemorrhoids Hemorrhoids are dilated (enlarged) veins around the rectum. Sometimes clots will form in the veins. This makes them swollen and painful. These are called thrombosed hemorrhoids. Causes of hemorrhoids include:  Constipation.   Straining to have a bowel movement.   HEAVY LIFTING  HOME CARE INSTRUCTIONS  Eat a well balanced diet and drink 6 to 8 glasses of water every day to avoid constipation. You may also use a bulk laxative.   Avoid straining to have bowel movements.   Keep anal area dry and clean.   Do not  use a donut shaped pillow or sit on the toilet for long periods. This increases blood pooling and pain.   Move your bowels when your body has the urge; this will require less straining and will decrease pain and pressure.

## 2015-07-22 NOTE — Op Note (Signed)
[facility] [address] [place], [address]   COLONOSCOPY PROCEDURE REPORT  PATIENT: Brandon Burton, Brandon Burton  MR#: [id number] BIRTHDATE: [birth year] , 49  yrs. old GENDER: male ENDOSCOPIST: Danie Binder, MD REFERRED Twanna Hy, M.D. PROCEDURE DATE:  2015/07/30 PROCEDURE:   Colonoscopy with biopsy and Colonoscopy with snare polypectomy INDICATIONS:hematochezia. MEDICATIONS: Demerol 75 mg IV and Versed 5 mg IV  DESCRIPTION OF PROCEDURE:    Physical exam was performed.  Informed consent was obtained from the patient after explaining the benefits, risks, and alternatives to procedure.  The patient was connected to monitor and placed in left lateral position. Continuous oxygen was provided by nasal cannula and IV medicine administered through an indwelling cannula.  After administration of sedation and rectal exam, the patients rectum was intubated and the EC-[id number] ([id number])  colonoscope was advanced under direct visualization to the cecum.  The scope was removed slowly by carefully examining the color, texture, anatomy, and integrity mucosa on the way out.  The patient was recovered in endoscopy and discharged home in satisfactory condition. Estimated blood loss is zero unless otherwise noted in this procedure report.    COLON FINDINGS: Three sessile polyps ranging from 2 to 56mm in size were found in the transverse colon, sigmoid colon, and ascending colon.  A polypectomy was performed with cold forceps.  , Four sessile polyps ranging from 6 to 62mm in size were found in the transverse colon.  A polypectomy was performed using snare cautery. , There was mild diverticulosis noted in the sigmoid colon with associated muscular hypertrophy and tortuosity.  , The colon was redundant.  , and Moderate sized internal hemorrhoids were found.   PREP QUALITY: excellent.  CECAL W/D TIME: 27       minutes COMPLICATIONS: None  ENDOSCOPIC IMPRESSION: 1.   SEVEN COLON POLYPS  REMOVED 2.   The LEFT colon IS SLIGHTLY redundant 3.   Mild diverticulosis in the sigmoid colon 4.   RECTAL BLEEDING DUE TO Moderate sized internal hemorrhoids  RECOMMENDATIONS: FOLLOW A HIGH FIBER DIET. AWAIT BIOPSY RESULTS. Next colonoscopy in 1-3 years.  ALL SISTERS, BROTHERS, CHILDREN, AND PARENTS NEED TO HAVE A COLONOSCOPY STARTING AT THE AGE OF 40.   _______________________________ eSignedDanie Binder, MD 07/30/15 10:12 AM    CPT CODES: ICD CODES:  The ICD and CPT codes recommended by this software are interpretations from the data that the clinical staff has captured with the software.  The verification of the translation of this report to the ICD and CPT codes and modifiers is the sole responsibility of the health care institution and practicing physician where this report was generated.  [employer] will not be held responsible for the validity of the ICD and CPT codes included on this report.  AMA assumes no liability for data contained or not contained herein. CPT is a Designer, television/film set of the [employer].

## 2015-07-22 NOTE — H&P (Signed)
  Primary Care Physician:  Asencion Noble, MD Primary Gastroenterologist:  Dr. Oneida Alar  Pre-Procedure History & Physical: HPI:  Brandon Burton is a 49 y.o. male here for  BRBPR.  Past Medical History  Diagnosis Date  . Hypertension     Past Surgical History  Procedure Laterality Date  . Shoulder surgery Left 2015    fix previous clavicular fracture complication    Prior to Admission medications   Medication Sig Start Date End Date Taking? Authorizing Provider  amLODipine (NORVASC) 10 MG tablet Take 10 mg by mouth daily.   Yes Historical Provider, MD  omeprazole (PRILOSEC) 20 MG capsule Take 1 capsule (20 mg total) by mouth daily. 07/08/15  Yes Carlis Stable, NP  polyethylene glycol-electrolytes (NULYTELY/GOLYTELY) 420 G solution Take 4,000 mLs by mouth once. 07/18/15  Yes Carlis Stable, NP  tadalafil (CIALIS) 20 MG tablet Take 20 mg by mouth daily as needed for erectile dysfunction.   Yes Historical Provider, MD    Allergies as of 07/08/2015  . (No Known Allergies)    Family History  Problem Relation Age of Onset  . Colon cancer Neg Hx     Social History   Social History  . Marital Status: Married    Spouse Name: N/A  . Number of Children: N/A  . Years of Education: N/A   Occupational History  . Not on file.   Social History Main Topics  . Smoking status: Light Tobacco Smoker    Types: Cigars  . Smokeless tobacco: Never Used     Comment: weekend / social  . Alcohol Use: 0.0 oz/week    0 Standard drinks or equivalent per week     Comment: social, every 1-2 months approx 1 drink per occasion  . Drug Use: No  . Sexual Activity: Not on file   Other Topics Concern  . Not on file   Social History Narrative    Review of Systems: See HPI, otherwise negative ROS   Physical Exam: BP 138/88 mmHg  Pulse 84  Temp(Src) 98.6 F (37 C) (Oral)  Resp 14  SpO2 99% General:   Alert,  pleasant and cooperative in NAD Head:  Normocephalic and atraumatic. Neck:   Supple; Lungs:  Clear throughout to auscultation.    Heart:  Regular rate and rhythm. Abdomen:  Soft, nontender and nondistended. Normal bowel sounds, without guarding, and without rebound.   Neurologic:  Alert and  oriented x4;  grossly normal neurologically.  Impression/Plan:    BRBPR  PLAN: TCS TODAY

## 2015-07-22 NOTE — Progress Notes (Signed)
REVIEWED-NO ADDITIONAL RECOMMENDATIONS. 

## 2015-07-25 ENCOUNTER — Encounter (HOSPITAL_COMMUNITY): Payer: Self-pay | Admitting: Gastroenterology

## 2015-08-02 ENCOUNTER — Telehealth: Payer: Self-pay | Admitting: Gastroenterology

## 2015-08-02 NOTE — Telephone Encounter (Signed)
Reminder in epic °

## 2015-08-02 NOTE — Telephone Encounter (Signed)
Please call pt. Brandon Burton had simple adenomas removed. ONE WAS > 1 CM, BUT DID NOT HAVE ADVANCED CHANGES.  FOLLOW A HIGH FIBER DIET. AVOID ITEMS THAT CAUSE BLOATING.  USE PREPARATION H 2 TO 4 TIMES A DAY AS NEEDED TO RELIEVE RECTAL PRESSURE/ITCHING/BLEEDING.  Next colonoscopy in 3 years. HIS SISTERS, BROTHERS, CHILDREN, AND PARENTS NEED TO HAVE A COLONOSCOPY STARTING AT THE AGE OF 40.

## 2015-08-05 NOTE — Telephone Encounter (Signed)
Routing to DS 

## 2015-08-05 NOTE — Telephone Encounter (Signed)
Pts wife is aware of results.

## 2015-10-08 ENCOUNTER — Ambulatory Visit: Payer: BLUE CROSS/BLUE SHIELD | Admitting: Nurse Practitioner

## 2016-01-30 ENCOUNTER — Other Ambulatory Visit: Payer: Self-pay | Admitting: Nurse Practitioner

## 2016-02-05 ENCOUNTER — Ambulatory Visit: Payer: BLUE CROSS/BLUE SHIELD | Admitting: Nurse Practitioner

## 2016-02-17 ENCOUNTER — Ambulatory Visit: Payer: BLUE CROSS/BLUE SHIELD | Admitting: Nurse Practitioner

## 2016-05-21 ENCOUNTER — Telehealth: Payer: Self-pay | Admitting: Orthopedic Surgery

## 2016-05-21 NOTE — Telephone Encounter (Signed)
Called back to patient; appointment scheduled. °

## 2016-05-21 NOTE — Telephone Encounter (Signed)
Call received from patient/spouse asking for appointment for left shoulder, for which Dr Aline Brochure last saw patient 06/07/14, and referred out to Dr Mardelle Matte at [facility].  States had surgery done this shoulder by Dr Mardelle Matte.  Please review and advise.  Ph#'s [phone] [facility]) or [phone], wife Otila Kluver

## 2016-05-21 NOTE — Telephone Encounter (Signed)
Same shoulder  I can see but will refer any problems to Dr Mardelle Matte And please place in appropriate lsot   Not 2 new patients in a row

## 2016-06-08 ENCOUNTER — Encounter: Payer: Self-pay | Admitting: Orthopedic Surgery

## 2016-06-08 ENCOUNTER — Ambulatory Visit (INDEPENDENT_AMBULATORY_CARE_PROVIDER_SITE_OTHER): Payer: BLUE CROSS/BLUE SHIELD | Admitting: Orthopedic Surgery

## 2016-06-08 ENCOUNTER — Ambulatory Visit (INDEPENDENT_AMBULATORY_CARE_PROVIDER_SITE_OTHER): Payer: BLUE CROSS/BLUE SHIELD

## 2016-06-08 VITALS — BP 135/89 | HR 88 | Ht 68.0 in | Wt 241.0 lb

## 2016-06-08 DIAGNOSIS — M25512 Pain in left shoulder: Secondary | ICD-10-CM

## 2016-06-08 DIAGNOSIS — M75122 Complete rotator cuff tear or rupture of left shoulder, not specified as traumatic: Secondary | ICD-10-CM | POA: Diagnosis not present

## 2016-06-08 MED ORDER — NABUMETONE 750 MG PO TABS: 750.0000 mg | ORAL_TABLET | Freq: Two times a day (BID) | ORAL | 2 refills | Status: DC

## 2016-06-08 NOTE — Progress Notes (Signed)
Chief Complaint  Patient presents with  . Follow-up    left shoulder pain   HPI   50 year old male had a before meals joint reconstruction with Dr. Mardelle Matte for type III separation. He complains of pain in the rotator interval and weakness in his left shoulder. Is not lost any range of motion. No prior treatment. Pain seems to be related to activity area  Review of Systems  Constitutional: Negative for chills, fever and weight loss.  Respiratory: Negative for shortness of breath.   Cardiovascular: Negative for chest pain.  Neurological: Negative for tingling.    Past Medical History:  Diagnosis Date  . Hypertension     Past Surgical History:  Procedure Laterality Date  . COLONOSCOPY N/A 07/22/2015   SLF:7 polyps removed/mild diverticulosis/moderate internal hemorrhoids  . SHOULDER SURGERY Left 2015   fix previous clavicular fracture complication   Family History  Problem Relation Age of Onset  . Colon cancer Neg Hx    Social History  Substance Use Topics  . Smoking status: Light Tobacco Smoker    Types: Cigars  . Smokeless tobacco: Never Used     Comment: weekend / social  . Alcohol use 0.0 oz/week     Comment: social, every 1-2 months approx 1 drink per occasion   Current Meds  Medication Sig  . amLODipine (NORVASC) 10 MG tablet Take 10 mg by mouth daily.    BP 135/89   Pulse 88   Ht 5\' 8"  (1.727 m)   Wt 241 lb (109.3 kg)   BMI 36.64 kg/m   Physical Exam  Constitutional: He is oriented to person, place, and time. He appears well-developed and well-nourished. No distress.  Cardiovascular: Normal rate and intact distal pulses.   Neurological: He is alert and oriented to person, place, and time.  Skin: Skin is warm and dry. No rash noted. He is not diaphoretic. No erythema. No pallor.  Psychiatric: He has a normal mood and affect. His behavior is normal. Judgment and thought content normal.    Ortho Exam Left shoulder surgical incision is nontender. He  appears to have tenderness in the rotator interval. He has full forward elevation of the shoulder and mild weakness to the rotator cuff with manual muscle testing when compared to the right There is no instability in the glenohumeral joint. The skin incision healed nicely. There is some numbness around the incision. No cervical lymphadenopathy normal sensation distally and normal pulse distally note left distal clavicle slightly higher than acromion but much improved from the preop x-ray which I reviewed from 2015  Right shoulder full range of motion normal strength  ASSESSMENT: My personal interpretation of the images:  High riding clavicle, discontinuity of the humeral head and glenoid medial contour with proximal migration of the humeral head    PLAN NSAID Physical therapy  Return 6 weeks if no improvement MRI of the shoulder  Differential diagnosis rotator cuff tear, rotator cuff syndrome, pain from the reconstruction.  Arther Abbott, MD 06/08/2016 1:48 PM

## 2016-06-08 NOTE — Addendum Note (Signed)
Addended by: Baldomero Lamy B on: 06/08/2016 02:04 PM   Modules accepted: Orders

## 2016-06-08 NOTE — Patient Instructions (Signed)
Physical therapy  Start Relafen 750 mg twice a day  Return 6 weeks

## 2016-07-20 ENCOUNTER — Ambulatory Visit: Payer: BLUE CROSS/BLUE SHIELD | Admitting: Orthopedic Surgery

## 2016-07-22 ENCOUNTER — Ambulatory Visit: Payer: BLUE CROSS/BLUE SHIELD | Admitting: Orthopedic Surgery

## 2016-08-31 ENCOUNTER — Ambulatory Visit: Payer: BLUE CROSS/BLUE SHIELD | Admitting: Orthopedic Surgery

## 2016-09-15 ENCOUNTER — Telehealth: Payer: Self-pay | Admitting: Gastroenterology

## 2016-09-15 NOTE — Telephone Encounter (Signed)
[phone] PATIENT WOULD LIKE OMEPRAZOLE SENT TO [facility] FOR A 90 DAY SUPPLY

## 2016-09-16 MED ORDER — OMEPRAZOLE 20 MG PO CPDR: 20.0000 mg | DELAYED_RELEASE_CAPSULE | Freq: Every day | ORAL | 3 refills | Status: DC

## 2016-09-16 NOTE — Telephone Encounter (Signed)
Completed.

## 2016-09-16 NOTE — Telephone Encounter (Signed)
Forwarding to refill box.  

## 2016-09-16 NOTE — Addendum Note (Signed)
Addended by: Annitta Needs on: 09/16/2016 04:48 PM   Modules accepted: Orders

## 2016-10-02 ENCOUNTER — Ambulatory Visit: Payer: BLUE CROSS/BLUE SHIELD | Admitting: Orthopedic Surgery

## 2016-10-15 ENCOUNTER — Other Ambulatory Visit: Payer: Self-pay | Admitting: Gastroenterology

## 2016-10-21 ENCOUNTER — Ambulatory Visit: Payer: BLUE CROSS/BLUE SHIELD | Admitting: Orthopedic Surgery

## 2016-12-07 ENCOUNTER — Ambulatory Visit: Payer: BLUE CROSS/BLUE SHIELD | Admitting: Orthopedic Surgery

## 2017-06-07 ENCOUNTER — Ambulatory Visit (INDEPENDENT_AMBULATORY_CARE_PROVIDER_SITE_OTHER): Payer: BLUE CROSS/BLUE SHIELD | Admitting: Orthopedic Surgery

## 2017-06-07 ENCOUNTER — Encounter: Payer: Self-pay | Admitting: Orthopedic Surgery

## 2017-06-07 DIAGNOSIS — M25512 Pain in left shoulder: Secondary | ICD-10-CM | POA: Diagnosis not present

## 2017-06-07 DIAGNOSIS — M75122 Complete rotator cuff tear or rupture of left shoulder, not specified as traumatic: Secondary | ICD-10-CM

## 2017-06-07 NOTE — Progress Notes (Signed)
Routine follow-up visit  Left shoulder  Patient had a history of A C joint reconstruction by Dr. Mardelle Matte in 2015 he did well until sometime in September of last year and I saw him and thought he might have some rotator cuff pathology versus fatigue from his [facility] joint reconstruction  He presents now for reevaluation with possibility of needing MRI for further delineation of pathology  Systems review: right shoulder discomfort  Normal appearance large body habitus oriented 3 mood affect normal gait normal. Right-to-left shoulder exam shows weakness of the left rotator cuff as described below  Shoulder remain stable neurovascular exam intact  As we noted in the prior evaluation pain left shoulder including rotator cuff interval weakness of the rotator cuff especially in abduction. He did do some therapy and took some in said medication nabumetone without relief  Recommend MRI left shoulder  Encounter Diagnoses  Name Primary?  . Complete rotator cuff tear of left shoulder Yes  . Pain in joint of left shoulder      [place] controlled substance reporting system reviewed

## 2017-06-07 NOTE — Patient Instructions (Addendum)
MRI left shoulder   Rotator Cuff Injury Rotator cuff injury is any type of injury to the set of muscles and tendons that make up the stabilizing unit of your shoulder. This unit holds the ball of your upper arm bone (humerus) in the socket of your shoulder blade (scapula). What are the causes? Injuries to your rotator cuff most commonly come from sports or activities that cause your arm to be moved repeatedly over your head. Examples of this include throwing, weight lifting, swimming, or racquet sports. Long lasting (chronic) irritation of your rotator cuff can cause soreness and swelling (inflammation), bursitis, and eventual damage to your tendons, such as a tear (rupture). What are the signs or symptoms? Acute rotator cuff tear:  Sudden tearing sensation followed by severe pain shooting from your upper shoulder down your arm toward your elbow.  Decreased range of motion of your shoulder because of pain and muscle spasm.  Severe pain.  Inability to raise your arm out to the side because of pain and loss of muscle power (large tears).  Chronic rotator cuff tear:  Pain that usually is worse at night and may interfere with sleep.  Gradual weakness and decreased shoulder motion as the pain worsens.  Decreased range of motion.  Rotator cuff tendinitis:  Deep ache in your shoulder and the outside upper arm over your shoulder.  Pain that comes on gradually and becomes worse when lifting your arm to the side or turning it inward.  How is this diagnosed? Rotator cuff injury is diagnosed through a medical history, physical exam, and imaging exam. The medical history helps determine the type of rotator cuff injury. Your health care provider will look at your injured shoulder, feel the injured area, and ask you to move your shoulder in different positions. X-ray exams typically are done to rule out other causes of shoulder pain, such as fractures. MRI is the exam of choice for the most severe  shoulder injuries because the images show muscles and tendons. How is this treated? Chronic tear:  Medicine for pain, such as acetaminophen or ibuprofen.  Physical therapy and range-of-motion exercises may be helpful in maintaining shoulder function and strength.  Steroid injections into your shoulder joint.  Surgical repair of the rotator cuff if the injury does not heal with noninvasive treatment.  Acute tear:  Anti-inflammatory medicines such as ibuprofen and naproxen to help reduce pain and swelling.  A sling to help support your arm and rest your rotator cuff muscles. Long-term use of a sling is not advised. It may cause significant stiffening of the shoulder joint.  Surgery may be considered within a few weeks, especially in younger, active people, to return the shoulder to full function.  Indications for surgical treatment include the following: ? Age younger than 23 years. ? Rotator cuff tears that are complete. ? Physical therapy, rest, and anti-inflammatory medicines have been used for 6-8 weeks, with no improvement. ? Employment or sporting activity that requires constant shoulder use.  Tendinitis:  Anti-inflammatory medicines such as ibuprofen and naproxen to help reduce pain and swelling.  A sling to help support your arm and rest your rotator cuff muscles. Long-term use of a sling is not advised. It may cause significant stiffening of the shoulder joint.  Severe tendinitis may require: ? Steroid injections into your shoulder joint. ? Physical therapy. ? Surgery.  Follow these instructions at home:  Apply ice to your injury: ? Put ice in a plastic bag. ? Place a towel between  your skin and the bag. ? Leave the ice on for 20 minutes, 2-3 times a day.  If you have a shoulder immobilizer (sling and straps), wear it until told otherwise by your health care provider.  You may want to sleep on several pillows or in a recliner at night to lessen swelling and  pain.  Only take over-the-counter or prescription medicines for pain, discomfort, or fever as directed by your health care provider.  Do simple hand squeezing exercises with a soft rubber ball to decrease hand swelling. Contact a health care provider if:  Your shoulder pain increases, or new pain or numbness develops in your arm, hand, or fingers.  Your hand or fingers are colder than your other hand. Get help right away if:  Your arm, hand, or fingers are numb or tingling.  Your arm, hand, or fingers are increasingly swollen and painful, or they turn white or blue. This information is not intended to replace advice given to you by your health care provider. Make sure you discuss any questions you have with your health care provider. Document Released: 09/11/2000 Document Revised: 02/20/2016 Document Reviewed: 04/26/2013 Elsevier Interactive Patient Education  2018 [employer].

## 2017-06-07 NOTE — Addendum Note (Signed)
Addended by: Baldomero Lamy B on: 06/07/2017 03:26 PM   Modules accepted: Orders

## 2017-06-16 ENCOUNTER — Ambulatory Visit (HOSPITAL_COMMUNITY): Admission: RE | Admit: 2017-06-16 | Payer: BLUE CROSS/BLUE SHIELD | Source: Ambulatory Visit

## 2017-06-18 ENCOUNTER — Ambulatory Visit: Payer: BLUE CROSS/BLUE SHIELD | Admitting: Orthopedic Surgery

## 2017-07-01 ENCOUNTER — Encounter: Payer: Self-pay | Admitting: Orthopedic Surgery

## 2017-07-07 ENCOUNTER — Encounter (HOSPITAL_COMMUNITY): Payer: Self-pay

## 2017-07-07 ENCOUNTER — Ambulatory Visit (HOSPITAL_COMMUNITY)
Admission: RE | Admit: 2017-07-07 | Discharge: 2017-07-07 | Disposition: A | Discharge status: Home / Self Care | Payer: BLUE CROSS/BLUE SHIELD | Source: Ambulatory Visit | Attending: Orthopedic Surgery | Admitting: Orthopedic Surgery

## 2017-07-07 DIAGNOSIS — M25512 Pain in left shoulder: Secondary | ICD-10-CM

## 2017-10-20 DIAGNOSIS — H40013 Open angle with borderline findings, low risk, bilateral: Secondary | ICD-10-CM | POA: Diagnosis not present

## 2017-11-06 ENCOUNTER — Other Ambulatory Visit: Payer: Self-pay | Admitting: Gastroenterology

## 2017-12-01 DIAGNOSIS — L918 Other hypertrophic disorders of the skin: Secondary | ICD-10-CM | POA: Diagnosis not present

## 2017-12-15 DIAGNOSIS — H40013 Open angle with borderline findings, low risk, bilateral: Secondary | ICD-10-CM | POA: Diagnosis not present

## 2018-02-21 ENCOUNTER — Other Ambulatory Visit: Payer: Self-pay | Admitting: Gastroenterology

## 2018-04-19 ENCOUNTER — Encounter: Payer: Self-pay | Admitting: Gastroenterology

## 2018-05-11 ENCOUNTER — Ambulatory Visit: Payer: BLUE CROSS/BLUE SHIELD | Admitting: Nurse Practitioner

## 2018-05-16 ENCOUNTER — Ambulatory Visit: Payer: BLUE CROSS/BLUE SHIELD | Admitting: Nurse Practitioner

## 2018-06-03 ENCOUNTER — Telehealth: Payer: Self-pay | Admitting: Orthopedic Surgery

## 2018-06-03 ENCOUNTER — Encounter: Payer: Self-pay | Admitting: Orthopedic Surgery

## 2018-06-03 ENCOUNTER — Ambulatory Visit: Payer: BLUE CROSS/BLUE SHIELD | Admitting: Orthopedic Surgery

## 2018-06-03 ENCOUNTER — Ambulatory Visit (INDEPENDENT_AMBULATORY_CARE_PROVIDER_SITE_OTHER): Payer: BLUE CROSS/BLUE SHIELD

## 2018-06-03 VITALS — BP 137/80 | HR 80 | Ht 68.0 in | Wt 251.0 lb

## 2018-06-03 DIAGNOSIS — M1712 Unilateral primary osteoarthritis, left knee: Secondary | ICD-10-CM

## 2018-06-03 DIAGNOSIS — M25562 Pain in left knee: Secondary | ICD-10-CM | POA: Diagnosis not present

## 2018-06-03 DIAGNOSIS — M25462 Effusion, left knee: Secondary | ICD-10-CM

## 2018-06-03 DIAGNOSIS — G8929 Other chronic pain: Secondary | ICD-10-CM

## 2018-06-03 NOTE — Progress Notes (Signed)
NEW PATIENT OFFICE VISI  Chief Complaint  Patient presents with  . Knee Pain    left for a month after playing softball     52 year old male history of pain in his left knee x1 month  He says he was playing softball over the summer did pretty well started having some medial sided knee pain which did not get better.  He sought medical attention at the workers place of employment and took some diclofenac which did not completely relieve his pain although he got some pain relief.  He continued to limp but continued to have some pain although is been less over the last 2 weeks.  He does note activity related increase in pain and wanted it checked out  Summation pain medial side left knee duration 4 to 6 weeks quality dull throbbing aching severity mild timing exercise related occasionally at night context seems to be improving but not completely resolved modifying factors partially relieved with diclofenac and rest associated symptoms he feels that there is swelling on the knee   Review of Systems  Musculoskeletal: Positive for joint pain. Negative for back pain.  Neurological: Negative for tingling.  All other systems reviewed and are negative.    Past Medical History:  Diagnosis Date  . Hypertension     Past Surgical History:  Procedure Laterality Date  . COLONOSCOPY N/A 07/22/2015   SLF:7 polyps removed/mild diverticulosis/moderate internal hemorrhoids  . SHOULDER SURGERY Left 2015   fix previous clavicular fracture complication    Family History  Problem Relation Age of Onset  . Colon cancer Neg Hx    Social History   Tobacco Use  . Smoking status: Light Tobacco Smoker    Types: Cigars  . Smokeless tobacco: Never Used  . Tobacco comment: weekend / social  Substance Use Topics  . Alcohol use: Yes    Alcohol/week: 0.0 standard drinks    Comment: social, every 1-2 months approx 1 drink per occasion  . Drug use: No    No Known Allergies  Current Meds  Medication  Sig  . amLODipine (NORVASC) 10 MG tablet Take 10 mg by mouth daily.  . diclofenac (VOLTAREN) 75 MG EC tablet TK 1 T PO  BID WITH FOOD  . tadalafil (ADCIRCA/CIALIS) 20 MG tablet     BP 137/80   Pulse 80   Ht 5\' 8"  (1.727 m)   Wt 251 lb (113.9 kg)   BMI 38.16 kg/m   Physical Exam  Constitutional: He is oriented to person, place, and time. He appears well-developed and well-nourished.  Vital signs have been reviewed and are stable. Gen. appearance the patient is well-developed and well-nourished with normal grooming and hygiene.   Neurological: He is alert and oriented to person, place, and time.  Skin: Skin is warm and dry. No erythema.  Psychiatric: He has a normal mood and affect.  Vitals reviewed.   Ortho Exam Right knee alignment is normal no tenderness or swelling range of motion is full all ligaments were stable muscle tone was normal there is no tremor strength was excellent skin was warm dry and intact he had a good distal pulse normal sensation in the leg  Left knee joint effusion moderate tenderness to the medial femoral condyle and medial joint line range of motion full but painful stability tests were normal McMurray sign was negative her strength was normal muscle tone was excellent skin was normal pulse was excellent sensation was normal   MEDICAL DECISION SECTION  Xrays were done  at [facility] orthopedics  My independent reading of xrays:  See dictated report but he has mild arthritis of the knee  Encounter Diagnoses  Name Primary?  . Chronic pain of left knee Yes  . Primary osteoarthritis of left knee   . Effusion of knee joint, left     PLAN: (Rx., injectx, surgery, frx, mri/ct) Recommend injection and continue diclofenac as well as rest over the next 6 months  Procedure note left knee injection verbal consent was obtained to inject left knee joint  Timeout was completed to confirm the site of injection  The medications used were 40 mg of Depo-Medrol  and 1% lidocaine 3 cc  Anesthesia was provided by ethyl chloride and the skin was prepped with alcohol.  After cleaning the skin with alcohol a 20-gauge needle was used to inject the left knee joint. There were no complications. A sterile bandage was applied.    No orders of the defined types were placed in this encounter.   Arther Abbott, MD  06/03/2018 12:41 PM

## 2018-06-03 NOTE — Telephone Encounter (Signed)
Form faxed to patient's employer's medical department, [place], [phone], as per request. Authorization on file. Patient aware, and has original form to also give to employer.

## 2018-06-03 NOTE — Patient Instructions (Signed)
COPY FMLA NO CHARGE

## 2018-08-16 ENCOUNTER — Ambulatory Visit: Payer: BLUE CROSS/BLUE SHIELD | Admitting: Nurse Practitioner

## 2018-08-16 ENCOUNTER — Encounter: Payer: Self-pay | Admitting: *Deleted

## 2018-08-16 ENCOUNTER — Encounter: Payer: Self-pay | Admitting: Nurse Practitioner

## 2018-08-16 ENCOUNTER — Other Ambulatory Visit: Payer: Self-pay | Admitting: *Deleted

## 2018-08-16 VITALS — BP 148/91 | HR 84 | Temp 97.3°F | Ht 68.0 in | Wt 252.8 lb

## 2018-08-16 DIAGNOSIS — Z86010 Personal history of colonic polyps: Secondary | ICD-10-CM | POA: Diagnosis not present

## 2018-08-16 DIAGNOSIS — K219 Gastro-esophageal reflux disease without esophagitis: Secondary | ICD-10-CM

## 2018-08-16 MED ORDER — OMEPRAZOLE 20 MG PO CPDR: 20.0000 mg | DELAYED_RELEASE_CAPSULE | Freq: Every day | ORAL | 3 refills | Status: DC

## 2018-08-16 MED ORDER — PEG 3350-KCL-NA BICARB-NACL 420 G PO SOLR: 4000.0000 mL | Freq: Once | ORAL | 0 refills | Status: AC

## 2018-08-16 NOTE — Progress Notes (Addendum)
REVIEWED. TCS for  PERSONAL HISTORY OF POLYPS.  Primary Care Physician:  Asencion Noble, MD Primary Gastroenterologist:  Dr. Oneida Alar  Chief Complaint  Patient presents with  . Gastroesophageal Reflux    if doesn't take med; needs refill  . Colonoscopy    due for 3 yr tcs    HPI:   Brandon Burton is a 52 y.o. male who presents on recall for 3-year repeat colonoscopy as well as refills on medications.  The patient has not been seen by our office since 07/08/2015 when he was seen for GERD and rectal bleeding.  At that time he was having intermittent rectal bleeding which self resolved prior to his office visit.  Also noted more regular GERD symptoms with occasional nausea if he eats late at night.  Recommended colonoscopy and Prilosec 20 mg daily, GERD education provided.  Colonoscopy was completed 07/22/2015 which found 7 colon polyps, left colon slightly redundant, mild diverticulosis, rectal bleeding due to moderate size internal hemorrhoids.  Surgical pathology found the polyps to be tubular adenoma.  Recommended repeat colonoscopy in 3 years (2019).  Today he states he's doing ok overall. He has GERD symptoms and is on Prilosec 20 mg daily. He has breakthrough if he misses his medication. He ran out about a month ago but was able to get an 'emergency" refill. Denies abdominal pain, N/V, hematochezia, melena, fever, chills, unintentional weight loss. Denies chest pain, dyspnea, dizziness, lightheadedness, syncope, near syncope. Denies any other upper or lower GI symptoms.  Past Medical History:  Diagnosis Date  . Hypertension     Past Surgical History:  Procedure Laterality Date  . COLONOSCOPY N/A 07/22/2015   SLF:7 polyps removed/mild diverticulosis/moderate internal hemorrhoids  . SHOULDER SURGERY Left 2015   fix previous clavicular fracture complication    Current Outpatient Medications  Medication Sig Dispense Refill  . amLODipine (NORVASC) 10 MG tablet Take 10 mg by mouth  daily.    . diclofenac (VOLTAREN) 75 MG EC tablet Take 75 mg by mouth daily.   1  . omeprazole (PRILOSEC) 20 MG capsule Take 1 capsule by mouth daily.  3  . tadalafil (ADCIRCA/CIALIS) 20 MG tablet      No current facility-administered medications for this visit.     Allergies as of 08/16/2018  . (No Known Allergies)    Family History  Problem Relation Age of Onset  . Colon cancer Neg Hx     Social History   Socioeconomic History  . Marital status: Married    Spouse name: Not on file  . Number of children: Not on file  . Years of education: Not on file  . Highest education level: Not on file  Occupational History  . Not on file  Social Needs  . Financial resource strain: Not on file  . Food insecurity:    Worry: Not on file    Inability: Not on file  . Transportation needs:    Medical: Not on file    Non-medical: Not on file  Tobacco Use  . Smoking status: Light Tobacco Smoker    Types: Cigars  . Smokeless tobacco: Never Used  . Tobacco comment: weekend / social  Substance and Sexual Activity  . Alcohol use: Yes    Alcohol/week: 0.0 standard drinks    Comment: social, every 1-2 months approx 1 drink per occasion  . Drug use: No  . Sexual activity: Not on file  Lifestyle  . Physical activity:    Days per week: Not on file  Minutes per session: Not on file  . Stress: Not on file  Relationships  . Social connections:    Talks on phone: Not on file    Gets together: Not on file    Attends religious service: Not on file    Active member of club or organization: Not on file    Attends meetings of clubs or organizations: Not on file    Relationship status: Not on file  . Intimate partner violence:    Fear of current or ex partner: Not on file    Emotionally abused: Not on file    Physically abused: Not on file    Forced sexual activity: Not on file  Other Topics Concern  . Not on file  Social History Narrative  . Not on file    Review of  Systems: Complete ROS negative except as per HPI.    Physical Exam: BP (!) 148/91   Pulse 84   Temp (!) 97.3 F (36.3 C) (Oral)   Ht 5\' 8"  (1.727 m)   Wt 252 lb 12.8 oz (114.7 kg)   BMI 38.44 kg/m  General:   Alert and oriented. Pleasant and cooperative. Well-nourished and well-developed.  Eyes:  Without icterus, sclera clear and conjunctiva pink.  Ears:  Normal auditory acuity. Cardiovascular:  S1, S2 present without murmurs appreciated. Extremities without clubbing or edema. Respiratory:  Clear to auscultation bilaterally. No wheezes, rales, or rhonchi. No distress.  Gastrointestinal:  +BS, soft, non-tender and non-distended. No HSM noted. No guarding or rebound. No masses appreciated.  Rectal:  Deferred  Musculoskalatal:  Symmetrical without gross deformities. Neurologic:  Alert and oriented x4;  grossly normal neurologically. Psych:  Alert and cooperative. Normal mood and affect. Heme/Lymph/Immune: No excessive bruising noted.    08/16/2018 8:50 AM   Disclaimer: This note was dictated with voice recognition software. Similar sounding words can inadvertently be transcribed and may not be corrected upon review.

## 2018-08-16 NOTE — Assessment & Plan Note (Signed)
History of multiple colon adenomas.  Last colonoscopy with 7 polyps removed found to be tubular adenoma.  Recommended 3-year repeat exam.  We will proceed with colonoscopy at this time.  Generally asymptomatic from a GI standpoint other than GERD which is well managed on PPI.  Proceed with colonoscopy with Dr. Oneida Alar in the near future. The risks, benefits, and alternatives have been discussed in detail with the patient. They state understanding and desire to proceed.   The patient is not on any anticoagulants, anxiolytics, chronic pain medications, or antidepressants.  Conscious sedation should be adequate for his procedure as it was for his last.

## 2018-08-16 NOTE — Patient Instructions (Signed)
1. I have sent a refill of your medication to your pharmacy to last 1 year. 2. We will schedule your colonoscopy for you. 3. Further recommendations will be made after your colonoscopy. 4. Return for follow-up in 1 year. 5. Call us if you have any questions or concerns.  At [facility] we value your feedback. You may receive a survey about your visit today. Please share your experience as we strive to create trusting relationships with our patients to provide genuine, compassionate, quality care.  We appreciate your understanding and patience as we review any laboratory studies, imaging, and other diagnostic tests that are ordered as we care for you. Our office policy is 5 business days for review of these results, and any emergent or urgent results are addressed in a timely manner for your best interest. If you do not hear from our office in 1 week, please contact us.   We also encourage the use of MyChart, which contains your medical information for your review as well. If you are not enrolled in this feature, an access code is on this after visit summary for your convenience. Thank you for allowing [place] to be involved in your care.  It was great to meet you today!  I hope you have a Happy Thanksgiving!!

## 2018-08-16 NOTE — Progress Notes (Signed)
CC'D TO PCP °

## 2018-08-16 NOTE — Assessment & Plan Note (Signed)
Chronic GERD currently well controlled on PPI.  He ran out of Prilosec 20 mg.  He has flare symptoms at any time he is not on PPI.  Is requesting refill at this time.  Return for follow-up in 1 year.

## 2018-09-01 ENCOUNTER — Telehealth: Payer: Self-pay

## 2018-09-01 NOTE — Telephone Encounter (Signed)
Tried to call pt to reschedule TCS that was scheduled for 10/10/18 w/SLF d/t SLF will not be available that day. No answer, LMOVM for return call.

## 2018-09-02 NOTE — Telephone Encounter (Signed)
Called pt's wife, TCS w/SLF rescheduled to 11/18/18 at 2:15pm. New instructions mailed. LMOVM for endo scheduler.

## 2018-10-03 ENCOUNTER — Other Ambulatory Visit: Payer: Self-pay | Admitting: Radiology

## 2018-10-03 MED ORDER — DICLOFENAC SODIUM 75 MG PO TBEC: 75.0000 mg | DELAYED_RELEASE_TABLET | Freq: Two times a day (BID) | ORAL | 1 refills | Status: DC

## 2018-10-03 NOTE — Telephone Encounter (Signed)
Refill request received via fax for Diclofenac 75

## 2018-11-18 ENCOUNTER — Encounter (HOSPITAL_COMMUNITY)
Admission: RE | Disposition: A | Discharge status: Home / Self Care | Payer: Self-pay | Source: Home / Self Care | Attending: Gastroenterology

## 2018-11-18 ENCOUNTER — Other Ambulatory Visit: Payer: Self-pay

## 2018-11-18 ENCOUNTER — Ambulatory Visit (HOSPITAL_COMMUNITY)
Admission: RE | Admit: 2018-11-18 | Discharge: 2018-11-18 | Disposition: A | Discharge status: Home / Self Care | Payer: BLUE CROSS/BLUE SHIELD | Attending: Gastroenterology | Admitting: Gastroenterology

## 2018-11-18 ENCOUNTER — Encounter (HOSPITAL_COMMUNITY): Payer: Self-pay | Admitting: Gastroenterology

## 2018-11-18 DIAGNOSIS — K644 Residual hemorrhoidal skin tags: Secondary | ICD-10-CM | POA: Diagnosis not present

## 2018-11-18 DIAGNOSIS — Z09 Encounter for follow-up examination after completed treatment for conditions other than malignant neoplasm: Secondary | ICD-10-CM | POA: Diagnosis not present

## 2018-11-18 DIAGNOSIS — Z8249 Family history of ischemic heart disease and other diseases of the circulatory system: Secondary | ICD-10-CM | POA: Insufficient documentation

## 2018-11-18 DIAGNOSIS — I10 Essential (primary) hypertension: Secondary | ICD-10-CM | POA: Insufficient documentation

## 2018-11-18 DIAGNOSIS — K219 Gastro-esophageal reflux disease without esophagitis: Secondary | ICD-10-CM | POA: Diagnosis not present

## 2018-11-18 DIAGNOSIS — F17290 Nicotine dependence, other tobacco product, uncomplicated: Secondary | ICD-10-CM | POA: Insufficient documentation

## 2018-11-18 DIAGNOSIS — K5730 Diverticulosis of large intestine without perforation or abscess without bleeding: Secondary | ICD-10-CM | POA: Insufficient documentation

## 2018-11-18 DIAGNOSIS — Z791 Long term (current) use of non-steroidal anti-inflammatories (NSAID): Secondary | ICD-10-CM | POA: Insufficient documentation

## 2018-11-18 DIAGNOSIS — Z79899 Other long term (current) drug therapy: Secondary | ICD-10-CM | POA: Diagnosis not present

## 2018-11-18 DIAGNOSIS — Z86010 Personal history of colonic polyps: Secondary | ICD-10-CM | POA: Insufficient documentation

## 2018-11-18 DIAGNOSIS — Q438 Other specified congenital malformations of intestine: Secondary | ICD-10-CM | POA: Diagnosis not present

## 2018-11-18 DIAGNOSIS — K648 Other hemorrhoids: Secondary | ICD-10-CM | POA: Diagnosis not present

## 2018-11-18 HISTORY — PX: COLONOSCOPY: SHX5424

## 2018-11-18 SURGERY — COLONOSCOPY
Anesthesia: Moderate Sedation

## 2018-11-18 MED ORDER — MEPERIDINE HCL 100 MG/ML IJ SOLN
INTRAMUSCULAR | Status: DC | PRN
  Administered 2018-11-18 (×2): 25 mg
  Administered 2018-11-18: 50 mg

## 2018-11-18 MED ORDER — MEPERIDINE HCL 100 MG/ML IJ SOLN
INTRAMUSCULAR | Status: AC
  Filled 2018-11-18: qty 2

## 2018-11-18 MED ORDER — SODIUM CHLORIDE 0.9 % IV SOLN
INTRAVENOUS | Status: DC
  Administered 2018-11-18: 1000 mL via INTRAVENOUS

## 2018-11-18 MED ORDER — MIDAZOLAM HCL 5 MG/5ML IJ SOLN
INTRAMUSCULAR | Status: DC | PRN
  Administered 2018-11-18 (×3): 2 mg via INTRAVENOUS

## 2018-11-18 MED ORDER — MIDAZOLAM HCL 5 MG/5ML IJ SOLN
INTRAMUSCULAR | Status: AC
  Filled 2018-11-18: qty 10

## 2018-11-18 NOTE — Op Note (Signed)
[facility] Patient Name: Brandon Burton Procedure Date: 11/18/2018 2:09 PM MRN: [id number] Date of Birth: 1965-12-08 Attending MD: Barney Drain MD, MD CSN: [id number] Age: 53 Admit Type: Outpatient Procedure:                Colonoscopy, SURVEILLANCE Indications:              Personal history of colonic polyps Providers:                Barney Drain MD, MD, Janeece Riggers, RN, Nelma Rothman,                            Technician Referring MD:             Asencion Noble Medicines:                Meperidine 100 mg IV, Midazolam 6 mg IV Complications:            No immediate complications. Estimated Blood Loss:     Estimated blood loss: none. Procedure:                Pre-Anesthesia Assessment:                           - Prior to the procedure, a History and Physical                            was performed, and patient medications and                            allergies were reviewed. The patient's tolerance of                            previous anesthesia was also reviewed. The risks                            and benefits of the procedure and the sedation                            options and risks were discussed with the patient.                            All questions were answered, and informed consent                            was obtained. Prior Anticoagulants: The patient has                            taken previous NSAID medication, last dose was 1                            day prior to procedure. ASA Grade Assessment: II -                            A patient with mild systemic disease. After  reviewing the risks and benefits, the patient was                            deemed in satisfactory condition to undergo the                            procedure. After obtaining informed consent, the                            colonoscope was passed under direct vision.                            Throughout the procedure, the patient's blood         pressure, pulse, and oxygen saturations were                            monitored continuously. The CF-HQ190L ([id number])                            scope was introduced through the anus and advanced                            to the the cecum, identified by appendiceal orifice                            and ileocecal valve. The ileocecal valve,                            appendiceal orifice, and rectum were photographed.                            The colonoscopy was somewhat difficult due to a                            tortuous colon. Successful completion of the                            procedure was aided by straightening and shortening                            the scope to obtain bowel loop reduction and                            COLOWRAP. The quality of the bowel preparation was                            good. The patient tolerated the procedure well. Scope In: 2:42:58 PM Scope Out: 2:55:07 PM Scope Withdrawal Time: 0 hours 10 minutes 13 seconds  Total Procedure Duration: 0 hours 12 minutes 9 seconds  Findings:      Multiple small and large-mouthed diverticula were found in the       recto-sigmoid colon, sigmoid colon and ascending colon.      External and internal hemorrhoids were found.  The exam was otherwise without abnormality.      The recto-sigmoid colon and sigmoid colon were mildly tortuous. Impression:               - MILD Diverticulosis in the recto-sigmoid colon,                            in the sigmoid colon and in the ascending colon.                           - External and internal hemorrhoids.                           - The examination was otherwise normal.                           - Tortuous LEFT colon. Moderate Sedation:      Moderate (conscious) sedation was administered by the endoscopy nurse       and supervised by the endoscopist. The following parameters were       monitored: oxygen saturation, heart rate, blood pressure, and response        to care. Total physician intraservice time was 23 minutes. Recommendation:           - Patient has a contact number available for                            emergencies. The signs and symptoms of potential                            delayed complications were discussed with the                            patient. Return to normal activities tomorrow.                            Written discharge instructions were provided to the                            patient.                           - High fiber diet.                           - Continue present medications.                           - Repeat colonoscopy in 5 years for surveillance. Procedure Code(s):        --- Professional ---                           [phone], Colonoscopy, flexible; diagnostic, including                            collection of specimen(s) by brushing or washing,  when performed (separate procedure)                           [id number], Moderate sedation; each additional 15                            minutes intraservice time                           G0500, Moderate sedation services provided by the                            same physician or other qualified health care                            professional performing a gastrointestinal                            endoscopic service that sedation supports,                            requiring the presence of an independent trained                            observer to assist in the monitoring of the                            patient's level of consciousness and physiological                            status; initial 15 minutes of intra-service time;                            patient age 45 years or older (additional time may                            be reported with [phone], as appropriate) Diagnosis Code(s):        --- Professional ---                           K64.8, Other hemorrhoids                           Z86.010, Personal  history of colonic polyps                           K57.30, Diverticulosis of large intestine without                            perforation or abscess without bleeding                           Q43.8, Other specified congenital malformations of                            intestine CPT  copyright 2018 American [employer] Association. All rights reserved. The codes documented in this report are preliminary and upon coder review may  be revised to meet current compliance requirements. Barney Drain, MD Barney Drain MD, MD 11/18/2018 3:13:28 PM This report has been signed electronically. Number of Addenda: 0

## 2018-11-18 NOTE — Discharge Instructions (Signed)
You DID NOT HAVE ANY POLYPS. YOU HAVE DIVERTICULOSIS IN YOUR RIGHT AND LEFT COLON. You have internal AND EXTERNAL hemorrhoids.   DRINK WATER TO KEEP URINE LIGHT YELLOW.  CONTINUE YOUR WEIGHT LOSS EFFORTS. YOUR BODY MASS INDEX IS OVER 30 WHICH MEANS YOU ARE OBESE. OBESITY IS ASSOCIATEYD WITH AN INCREASED FOR CIRRHOSIS AND ALL CANCERS, INCLUDING ESOPHAGEAL AND COLON CANCER. A WEIGHT OF 197 LBS OR LESS  WILL GET YOUR BODY MASS INDEX(BMI) UNDER 30.   FOLLOW A HIGH FIBER DIET. AVOID ITEMS THAT CAUSE BLOATING & GAS. SEE INFO BELOW.  USE PREPARATION H FOUR TIMES  A DAY IF NEEDED TO RELIEVE RECTAL PAIN/PRESSURE/BLEEDING.   Next colonoscopy in 5 years.   Colonoscopy Care After Read the instructions outlined below and refer to this sheet in the next week. These discharge instructions provide you with general information on caring for yourself after you leave the hospital. While your treatment has been planned according to the most current medical practices available, unavoidable complications occasionally occur. If you have any problems or questions after discharge, call DR. Markie Heffernan, [phone].  ACTIVITY  You may resume your regular activity, but move at a slower pace for the next 24 hours.   Take frequent rest periods for the next 24 hours.   Walking will help get rid of the air and reduce the bloated feeling in your belly (abdomen).   No driving for 24 hours (because of the medicine (anesthesia) used during the test).   You may shower.   Do not sign any important legal documents or operate any machinery for 24 hours (because of the anesthesia used during the test).    NUTRITION  Drink plenty of fluids.   You may resume your normal diet as instructed by your doctor.   Begin with a light meal and progress to your normal diet. Heavy or fried foods are harder to digest and may make you feel sick to your stomach (nauseated).   Avoid alcoholic beverages for 24 hours or as instructed.      MEDICATIONS  You may resume your normal medications.   WHAT YOU CAN EXPECT TODAY  Some feelings of bloating in the abdomen.   Passage of more gas than usual.   Spotting of blood in your stool or on the toilet paper  .  IF YOU HAD POLYPS REMOVED DURING THE COLONOSCOPY:  Eat a soft diet IF YOU HAVE NAUSEA, BLOATING, ABDOMINAL PAIN, OR VOMITING.    FINDING OUT THE RESULTS OF YOUR TEST Not all test results are available during your visit. DR. Oneida Alar WILL CALL YOU WITHIN 7 DAYS OF YOUR PROCEDUE WITH YOUR RESULTS. Do not assume everything is normal if you have not heard from DR. Jevaun Strick IN ONE WEEK, CALL HER OFFICE AT [phone].  SEEK IMMEDIATE MEDICAL ATTENTION AND CALL THE OFFICE: [phone] IF:  You have more than a spotting of blood in your stool.   Your belly is swollen (abdominal distention).   You are nauseated or vomiting.   You have a temperature over 101F.   You have abdominal pain or discomfort that is severe or gets worse throughout the day.    High-Fiber Diet A high-fiber diet changes your normal diet to include more whole grains, legumes, fruits, and vegetables. Changes in the diet involve replacing refined carbohydrates with unrefined foods. The calorie level of the diet is essentially unchanged. The Dietary Reference Intake (recommended amount) for adult males is 38 grams per day. For adult females, it is 25 grams per  day. Pregnant and lactating women should consume 28 grams of fiber per day. Fiber is the intact part of a plant that is not broken down during digestion. Functional fiber is fiber that has been isolated from the plant to provide a beneficial effect in the body.  PURPOSE  Increase stool bulk.   Ease and regulate bowel movements.   Lower cholesterol.   REDUCE RISK OF COLON CANCER  INDICATIONS THAT YOU NEED MORE FIBER  Constipation and hemorrhoids.   Uncomplicated diverticulosis (intestine condition) and irritable bowel syndrome.    Weight management.   As a protective measure against hardening of the arteries (atherosclerosis), diabetes, and cancer.   GUIDELINES FOR INCREASING FIBER IN THE DIET  Start adding fiber to the diet slowly. A gradual increase of about 5 more grams (2 slices of whole-wheat bread, 2 servings of most fruits or vegetables, or 1 bowl of high-fiber cereal) per day is best. Too rapid an increase in fiber may result in constipation, flatulence, and bloating.   Drink enough water and fluids to keep your urine clear or pale yellow. Water, juice, or caffeine-free drinks are recommended. Not drinking enough fluid may cause constipation.   Eat a variety of high-fiber foods rather than one type of fiber.   Try to increase your intake of fiber through using high-fiber foods rather than fiber pills or supplements that contain small amounts of fiber.   The goal is to change the types of food eaten. Do not supplement your present diet with high-fiber foods, but replace foods in your present diet.   INCLUDE A VARIETY OF FIBER SOURCES  Replace refined and processed grains with whole grains, canned fruits with fresh fruits, and incorporate other fiber sources. White rice, white breads, and most bakery goods contain little or no fiber.   Brown whole-grain rice, buckwheat oats, and many fruits and vegetables are all good sources of fiber. These include: broccoli, Brussels sprouts, cabbage, cauliflower, beets, sweet potatoes, white potatoes (skin on), carrots, tomatoes, eggplant, squash, berries, fresh fruits, and dried fruits.   Cereals appear to be the richest source of fiber. Cereal fiber is found in whole grains and bran. Bran is the fiber-rich outer coat of cereal grain, which is largely removed in refining. In whole-grain cereals, the bran remains. In breakfast cereals, the largest amount of fiber is found in those with "bran" in their names. The fiber content is sometimes indicated on the label.   You may  need to include additional fruits and vegetables each day.   In baking, for 1 cup white flour, you may use the following substitutions:   1 cup whole-wheat flour minus 2 tablespoons.   1/2 cup white flour plus 1/2 cup whole-wheat flour.   Diverticulosis Diverticulosis is a common condition that develops when small pouches (diverticula) form in the wall of the colon. The risk of diverticulosis increases with age. It happens more often in people who eat a low-fiber diet. Most individuals with diverticulosis have no symptoms. Those individuals with symptoms usually experience belly (abdominal) pain, constipation, or loose stools (diarrhea).  HOME CARE INSTRUCTIONS  Increase the amount of fiber in your diet as directed by your caregiver or dietician. This may reduce symptoms of diverticulosis.   Drink at least 6 to 8 glasses of water each day to prevent constipation.   Try not to strain when you have a bowel movement.   THERE IS NO NEED TO Avoid nuts and seeds to prevent complications.   FOODS HAVING HIGH FIBER  CONTENT INCLUDE:  Fruits. Apple, peach, pear, tangerine, raisins, prunes.   Vegetables. Brussels sprouts, asparagus, broccoli, cabbage, carrot, cauliflower, romaine lettuce, spinach, summer squash, tomato, winter squash, zucchini.   Starchy Vegetables. Baked beans, kidney beans, lima beans, split peas, lentils, potatoes (with skin).   Grains. Whole wheat bread, brown rice, bran flake cereal, plain oatmeal, white rice, shredded wheat, bran muffins.

## 2018-11-18 NOTE — OR Nursing (Signed)
Brandon Burton had a procedure at [facility] on 11/18/18 and cannot return to work until 4:00 on 11/19/18.

## 2018-11-18 NOTE — H&P (Signed)
Primary Care Physician:  Asencion Noble, MD Primary Gastroenterologist:  Dr. Oneida Alar  Pre-Procedure History & Physical: HPI:  Brandon Burton is a 53 y.o. male here for  PERSONAL HISTORY OF POLYPS. Pt showed up 1350 for 2 pm TCS.  Past Medical History:  Diagnosis Date  . GERD (gastroesophageal reflux disease)   . Hypertension     Past Surgical History:  Procedure Laterality Date  . COLONOSCOPY N/A 07/22/2015   SLF:7 polyps removed/mild diverticulosis/moderate internal hemorrhoids  . SHOULDER SURGERY Left 2015   fix previous clavicular fracture complication    Prior to Admission medications   Medication Sig Start Date End Date Taking? Authorizing Provider  amLODipine (NORVASC) 10 MG tablet Take 10 mg by mouth daily.   Yes [provider]  diclofenac (VOLTAREN) 75 MG EC tablet Take 1 tablet (75 mg total) by mouth 2 (two) times daily. Patient taking differently: Take 75 mg by mouth daily.  10/03/18  Yes Carole Civil, MD  omeprazole (PRILOSEC) 20 MG capsule Take 1 capsule (20 mg total) by mouth daily. 08/16/18  Yes Carlis Stable, NP  tadalafil (ADCIRCA/CIALIS) 20 MG tablet  05/31/18   [provider]    Allergies as of 08/16/2018  . (No Known Allergies)    Family History  Problem Relation Age of Onset  . Thyroid cancer Mother   . Heart attack Father   . Colon cancer Neg Hx   . Colon polyps Neg Hx     Social History   Socioeconomic History  . Marital status: Married    Spouse name: Not on file  . Number of children: Not on file  . Years of education: Not on file  . Highest education level: Not on file  Occupational History  . Not on file  Social Needs  . Financial resource strain: Not on file  . Food insecurity:    Worry: Not on file    Inability: Not on file  . Transportation needs:    Medical: Not on file    Non-medical: Not on file  Tobacco Use  . Smoking status: Light Tobacco Smoker    Types: Cigars  . Smokeless tobacco: Never Used  .  Tobacco comment: weekend / social  Substance and Sexual Activity  . Alcohol use: Yes    Alcohol/week: 0.0 standard drinks    Comment: social, every 1-2 months approx 1 drink per occasion  . Drug use: No  . Sexual activity: Not on file  Lifestyle  . Physical activity:    Days per week: Not on file    Minutes per session: Not on file  . Stress: Not on file  Relationships  . Social connections:    Talks on phone: Not on file    Gets together: Not on file    Attends religious service: Not on file    Active member of club or organization: Not on file    Attends meetings of clubs or organizations: Not on file    Relationship status: Not on file  . Intimate partner violence:    Fear of current or ex partner: Not on file    Emotionally abused: Not on file    Physically abused: Not on file    Forced sexual activity: Not on file  Other Topics Concern  . Not on file  Social History Narrative  . Not on file    Review of Systems: See HPI, otherwise negative ROS   Physical Exam: BP 133/80   Pulse 77  Temp 99.1 F (37.3 C) (Oral)   Resp 13   Ht 5' 8.5" (1.74 m)   Wt 109.8 kg   SpO2 99%   BMI 36.26 kg/m  General:   Alert,  pleasant and cooperative in NAD Head:  Normocephalic and atraumatic. Neck:  Supple; Lungs:  Clear throughout to auscultation.    Heart:  Regular rate and rhythm. Abdomen:  Soft, nontender and nondistended. Normal bowel sounds, without guarding, and without rebound.   Neurologic:  Alert and  oriented x4;  grossly normal neurologically.  Impression/Plan:     PERSONAL HISTORY OF POLYPS.  PLAN: 1. TCS TODAY. DISCUSSED PROCEDURE, BENEFITS, & RISKS: < 1% chance of medication reaction, bleeding, perforation, or rupture of spleen/liver.

## 2018-11-22 ENCOUNTER — Encounter (HOSPITAL_COMMUNITY): Payer: Self-pay | Admitting: Gastroenterology

## 2018-12-14 ENCOUNTER — Other Ambulatory Visit: Payer: Self-pay | Admitting: Orthopedic Surgery

## 2019-02-03 ENCOUNTER — Encounter: Payer: Self-pay | Admitting: Orthopedic Surgery

## 2019-02-03 ENCOUNTER — Other Ambulatory Visit: Payer: Self-pay

## 2019-02-03 ENCOUNTER — Ambulatory Visit: Payer: BLUE CROSS/BLUE SHIELD | Admitting: Orthopedic Surgery

## 2019-02-03 VITALS — BP 145/98 | HR 105 | Temp 97.0°F | Ht 68.0 in | Wt 250.0 lb

## 2019-02-03 DIAGNOSIS — M25462 Effusion, left knee: Secondary | ICD-10-CM

## 2019-02-03 NOTE — Progress Notes (Signed)
Chief Complaint  Patient presents with  . Knee Pain    left knee swelling pain s/p push mowin yard on 02/01/19     53 history of chronic left knee pain presents with swelling  No new injuries but after being furloughed from work over the last 4 weeks he has tried to exercise more and was mowing his lawn and knee started swelling  He is on an NSAID at present did not improve the swelling he is having some pain in his left knee as well  Review of Systems  Constitutional: Negative for chills and fever.  Neurological: Negative for tingling.   Physical Exam Constitutional:      General: He is not in acute distress.    Appearance: Normal appearance. He is not ill-appearing, toxic-appearing or diaphoretic.  Musculoskeletal:       Legs:  Neurological:     Mental Status: He is alert.     Previously had x-ray showed mild arthritis medial compartment   Effusion  Aspiration injection  Procedure note injection and aspiration left knee joint  Verbal consent was obtained to aspirate and inject the left knee joint   Timeout was completed to confirm the site of aspiration and injection  An 18-gauge needle was used to aspirate the left knee joint from a suprapatellar lateral approach.  The medications used were 40 mg of Depo-Medrol and 1% lidocaine 3 cc  Anesthesia was provided by ethyl chloride and the skin was prepped with alcohol.  After cleaning the skin with alcohol an 18-gauge needle was used to aspirate the right knee joint.  We obtained 10 cc of fluid, clear   We followed this by injection of 40 mg of Depo-Medrol and 3 cc 1% lidocaine.  There were no complications. A sterile bandage was applied.  Use ice as needed Follow-up as needed

## 2019-07-03 ENCOUNTER — Other Ambulatory Visit: Payer: Self-pay

## 2019-07-03 ENCOUNTER — Encounter: Payer: Self-pay | Admitting: Orthopedic Surgery

## 2019-07-03 ENCOUNTER — Ambulatory Visit: Payer: BC Managed Care – PPO | Admitting: Orthopedic Surgery

## 2019-07-03 VITALS — BP 133/86 | HR 94 | Temp 98.4°F | Ht 68.0 in | Wt 250.0 lb

## 2019-07-03 DIAGNOSIS — M1712 Unilateral primary osteoarthritis, left knee: Secondary | ICD-10-CM

## 2019-07-03 DIAGNOSIS — M25462 Effusion, left knee: Secondary | ICD-10-CM

## 2019-07-03 NOTE — Progress Notes (Signed)
Progress Note   Patient ID: Brandon Burton, male   DOB: 01/27/1966, 53 y.o.   MRN: [id number]   Chief Complaint  Patient presents with  . Follow-up    Recheck on left knee.    Encounter Diagnoses  Name Primary?  . Effusion of knee joint, left Yes  . Primary osteoarthritis of left knee     53 year old male with chronic pain and arthritis comes in with painful left knee with swelling somewhat relieved by ibuprofen    Review of Systems  Musculoskeletal:       Chronic left shoulder pain requires intermittent leave from work Brandon Burton papers will be finished when he brings a man      BP 133/86   Pulse 94   Temp 98.4 F (36.9 C)   Ht 5\' 8"  (1.727 m)   Wt 250 lb (113.4 kg)   BMI 38.01 kg/m   Physical Exam Vitals signs and nursing note reviewed.  Constitutional:      Appearance: Normal appearance.  Musculoskeletal:     Comments: Small effusion left knee full range of motion tenderness medial joint line ligaments stable muscle tone normal skin intact no rash or erythema  Neurological:     Mental Status: He is alert and oriented to person, place, and time.  Psychiatric:        Mood and Affect: Mood normal.      Medical decisions:  (Established problem worse, x-ray ,physical therapy, over-the-counter medicines, read outside film or summarize x-ray)  Data  Imaging:   Last year x-rays show arthritis of the left knee medial compartment  Encounter Diagnoses  Name Primary?  . Effusion of knee joint, left Yes  . Primary osteoarthritis of left knee     PLAN:   A and I   Procedure note injection and aspiration left knee joint  Verbal consent was obtained to aspirate and inject the left knee joint   Timeout was completed to confirm the site of aspiration and injection  An 18-gauge needle was used to aspirate the left knee joint from a suprapatellar lateral approach.  The medications used were 40 mg of Depo-Medrol and 1% lidocaine 3 cc  Anesthesia was provided by  ethyl chloride and the skin was prepped with alcohol.  After cleaning the skin with alcohol an 18-gauge needle was used to aspirate the right knee joint.  We obtained 7 cc of fluid CLOUDY YELLOW CARTILAGE PARTICLES SEEN   We followed this by injection of 40 mg of Depo-Medrol and 3 cc 1% lidocaine.  There were no complications. A sterile bandage was applied.   Current Outpatient Medications  Medication Instructions  . amLODipine (NORVASC) 10 mg, Oral, Daily  . diclofenac (VOLTAREN) 75 MG EC tablet TAKE 1 TABLET(75 MG) BY MOUTH TWICE DAILY  . omeprazole (PRILOSEC) 20 mg, Oral, Daily  . tadalafil (ADCIRCA/CIALIS) 20 MG tablet No dose, route, or frequency recorded.   FU Oris Drone, MD 07/03/2019 11:19 AM

## 2019-07-03 NOTE — Patient Instructions (Signed)

## 2019-07-17 ENCOUNTER — Encounter: Payer: Self-pay | Admitting: Gastroenterology

## 2019-07-31 ENCOUNTER — Encounter: Payer: Self-pay | Admitting: Orthopedic Surgery

## 2019-11-01 DIAGNOSIS — I10 Essential (primary) hypertension: Secondary | ICD-10-CM | POA: Diagnosis not present

## 2019-11-06 DIAGNOSIS — I10 Essential (primary) hypertension: Secondary | ICD-10-CM | POA: Diagnosis not present

## 2019-11-06 DIAGNOSIS — E785 Hyperlipidemia, unspecified: Secondary | ICD-10-CM | POA: Diagnosis not present

## 2019-11-06 DIAGNOSIS — D473 Essential (hemorrhagic) thrombocythemia: Secondary | ICD-10-CM | POA: Diagnosis not present

## 2019-11-06 DIAGNOSIS — M25562 Pain in left knee: Secondary | ICD-10-CM | POA: Diagnosis not present

## 2019-11-15 ENCOUNTER — Other Ambulatory Visit: Payer: Self-pay

## 2019-11-15 ENCOUNTER — Ambulatory Visit: Payer: BC Managed Care – PPO

## 2019-11-15 ENCOUNTER — Ambulatory Visit (INDEPENDENT_AMBULATORY_CARE_PROVIDER_SITE_OTHER): Payer: BC Managed Care – PPO | Admitting: Orthopedic Surgery

## 2019-11-15 ENCOUNTER — Encounter: Payer: Self-pay | Admitting: Orthopedic Surgery

## 2019-11-15 VITALS — BP 128/84 | HR 87 | Ht 68.0 in | Wt 250.0 lb

## 2019-11-15 DIAGNOSIS — G8929 Other chronic pain: Secondary | ICD-10-CM

## 2019-11-15 DIAGNOSIS — M25562 Pain in left knee: Secondary | ICD-10-CM | POA: Diagnosis not present

## 2019-11-15 NOTE — Patient Instructions (Signed)
Continue medication.

## 2019-11-15 NOTE — Progress Notes (Signed)
Chief Complaint  Patient presents with  . Knee Pain    left knee painful / PCP told him to get xray, he is asking for xray today     54 year old male with chronic knee pain chronic arthritis had an aspiration injection back in October, last x-ray showed arthritis of the medial compartment is currently on diclofenac.  He recently saw his primary care doctor who requested an x-ray be done.  Past Medical History:  Diagnosis Date  . GERD (gastroesophageal reflux disease)   . Hypertension    Past Surgical History:  Procedure Laterality Date  . COLONOSCOPY N/A 07/22/2015   SLF:7 polyps removed/mild diverticulosis/moderate internal hemorrhoids  . COLONOSCOPY N/A 11/18/2018   Procedure: COLONOSCOPY;  Surgeon: Danie Binder, MD;  Location: [facility];  Service: Endoscopy;  Laterality: N/A;  2:15pm  . SHOULDER SURGERY Left 2015   fix previous clavicular fracture complication     Body mass index is 38.01 kg/m.  BP 128/84   Pulse 87   Ht 5\' 8"  (1.727 m)   Wt 250 lb (113.4 kg)   BMI 38.01 kg/m   Physical Exam Vitals and nursing note reviewed.  Constitutional:      Appearance: Normal appearance.  Musculoskeletal:     Comments: Left knee skin is normal knee is quiet no effusion no tenderness.  No crepitance.  Range of motion is normal ligaments are stable muscle tone is excellent strength is good    Neurological:     Mental Status: He is alert and oriented to person, place, and time.  Psychiatric:        Mood and Affect: Mood normal.    Encounter Diagnosis  Name Primary?  . Chronic pain of left knee Yes    Stable chronic illness meeting all criteria for improvement continue medication follow-up in a year for x-ray

## 2020-01-01 ENCOUNTER — Other Ambulatory Visit: Payer: Self-pay | Admitting: Orthopedic Surgery

## 2020-01-15 ENCOUNTER — Encounter: Payer: Self-pay | Admitting: Orthopedic Surgery

## 2020-01-15 ENCOUNTER — Other Ambulatory Visit: Payer: Self-pay

## 2020-01-15 ENCOUNTER — Ambulatory Visit (INDEPENDENT_AMBULATORY_CARE_PROVIDER_SITE_OTHER): Payer: BC Managed Care – PPO | Admitting: Orthopedic Surgery

## 2020-01-15 ENCOUNTER — Telehealth: Payer: Self-pay | Admitting: Orthopedic Surgery

## 2020-01-15 VITALS — BP 153/95 | HR 98 | Ht 68.0 in | Wt 254.0 lb

## 2020-01-15 DIAGNOSIS — G8929 Other chronic pain: Secondary | ICD-10-CM

## 2020-01-15 DIAGNOSIS — M1712 Unilateral primary osteoarthritis, left knee: Secondary | ICD-10-CM | POA: Diagnosis not present

## 2020-01-15 NOTE — Patient Instructions (Addendum)
Apply ice as needed to control swelling  Take your voltaren    Joint Steroid Injection A joint steroid injection is a procedure to relieve swelling and pain in a joint. Steroids are medicines that reduce inflammation. In this procedure, your health care provider uses a syringe and a needle to inject a steroid medicine into a painful and inflamed joint. A pain-relieving medicine (anesthetic) may be injected along with the steroid. In some cases, your health care provider may use an imaging technique such as ultrasound or fluoroscopy to guide the injection. Joints that are often treated with steroid injections include the knee, shoulder, hip, and spine. These injections may also be used in the elbow, ankle, and joints of the hands or feet. You may have joint steroid injections as part of your treatment for inflammation caused by:  Gout.  Rheumatoid arthritis.  Advanced wear-and-tear arthritis (osteoarthritis).  Tendinitis.  Bursitis. Joint steroid injections may be repeated, but having them too often can damage a joint or the skin over the joint. You should not have joint steroid injections less than 6 weeks apart or more than four times a year. Tell a health care provider about:  Any allergies you have.  All medicines you are taking, including vitamins, herbs, eye drops, creams, and over-the-counter medicines.  Any problems you or family members have had with anesthetic medicines.  Any blood disorders you have.  Any surgeries you have had.  Any medical conditions you have.  Whether you are pregnant or may be pregnant. What are the risks? Generally, this is a safe treatment. However, problems may occur, including:  Infection.  Bleeding.  Allergic reactions to medicines.  Damage to the joint or tissues around the joint.  Thinning of skin or loss of skin color over the joint.  Temporary flushing of the face or chest.  Temporary increase in pain.  Temporary increase in  blood sugar.  Failure to relieve inflammation or pain. What happens before the treatment?  You may have imaging tests of your joint.  Ask your health care provider about: ? Changing or stopping your regular medicines. This is especially important if you are taking diabetes medicines or blood thinners. ? Taking medicines such as aspirin and ibuprofen. These medicines can thin your blood. Do not take these medicines unless your health care provider tells you to take them. ? Taking over-the-counter medicines, vitamins, herbs, and supplements.  Ask your health care provider if you can drive yourself home after the procedure. What happens during the treatment?   Your health care provider will position you for the injection and locate the injection site over your joint.  The skin over the joint will be cleaned with a germ-killing soap.  Your health care provider may: ? Spray a numbing solution (topical anesthetic) over the injection site. ? Inject a local anesthetic under the skin above your joint.  The needle will be placed through your skin into your joint. Your health care provider may use imaging to guide the needle to the right spot for the injection. If imaging is used, a special contrast dye may be injected to confirm that the needle is in the correct location.  The steroid medicine will be injected into your joint.  Anesthetic may be injected along with the steroid. This may be a medicine that relieves pain for a short time (short-acting anesthetic) or for a longer time (long-acting anesthetic).  The needle will be removed, and an adhesive bandage (dressing) will be placed over the injection  site. The procedure may vary among health care providers and hospitals. What can I expect after the treatment?  You will be able to go home after the treatment.  It is normal to feel slight flushing for a few days after the injection.  After the treatment, it is common to have an increase  in joint pain after the anesthetic has worn off. This may happen about an hour after a short-acting anesthetic or about 8 hours after a longer-acting anesthetic.  You should begin to feel relief from joint pain and swelling after 24 to 48 hours. Follow these instructions at home: Injection site care  Leave the adhesive dressing over your injection site in place until your health care provider says you can remove it.  Check your injection site every day for signs of infection. Check for: ? Redness, swelling, or pain. ? Fluid or blood. ? Warmth. ? Pus or a bad smell. Activity  Return to your normal activities as told by your health care provider. Ask your health care provider what activities are safe for you. You may be asked to limit activities that put stress on the joint for a few days.  Do joint exercises as told by your health care provider.  Do not take baths, swim, or use a hot tub until your health care provider approves. Managing pain, stiffness, and swelling   If directed, put ice on the joint. ? Put ice in a plastic bag. ? Place a towel between your skin and the bag. ? Leave the ice on for 20 minutes, 2-3 times a day.  Raise (elevate) your joint above the level of your heart when you are sitting or lying down. General instructions  Take over-the-counter and prescription medicines only as told by your health care provider.  Do not use any products that contain nicotine or tobacco, such as cigarettes, e-cigarettes, and chewing tobacco. These can delay joint healing. If you need help quitting, ask your health care provider.  If you have diabetes, be aware that your blood sugar may be slightly elevated for several days after the injection.  Keep all follow-up visits as told by your health care provider. This is important. Contact a health care provider if you have:  Chills or a fever.  Any signs of infection at your injection site.  Increased pain or swelling or no  relief after 2 days. Summary  A joint steroid injection is a treatment to relieve pain and swelling in a joint.  Steroids are medicines that reduce inflammation. Your health care provider may add an anesthetic along with the steroid.  You may have joint steroid injections as part of your arthritis treatment.  Joint steroid injections may be repeated, but having them too often can damage a joint or the skin over the joint.  Contact your health care provider if you have a fever, chills, or signs of infection or if you get no relief from joint pain or swelling. This information is not intended to replace advice given to you by your health care provider. Make sure you discuss any questions you have with your health care provider. Document Revised: 05/17/2018 Document Reviewed: 05/17/2018 Elsevier Patient Education  2020 [employer].

## 2020-01-15 NOTE — Telephone Encounter (Signed)
8:43am - spoke w/patient regarding immediate appointment for knee pain flare up - scheduled in cancellation slot; aware.

## 2020-01-15 NOTE — Progress Notes (Signed)
Chief Complaint  Patient presents with  . Knee Pain    left    History 54 year old male with chronic arthritis and knee pain chronic effusions presents with acute onset of increasing pain left knee with swelling.  He is on Prilosec and Voltaren it did not control the swelling but he rested his knee and swelling seemed to go down still complaining of pain  BP (!) 153/95   Pulse 98   Ht 5\' 8"  (1.727 m)   Wt 254 lb (115.2 kg)   BMI 38.62 kg/m   Exam shows no swelling in the joint today but tenderness moderate medial side mild lateral side His range of motion has been maintained up to 120 degrees with full extension I do not detect any instability Is neurovascular intact He is walking without a limp or support  Encounter Diagnoses  Name Primary?  . Chronic pain of left knee Yes  . Primary osteoarthritis of left knee     Chronic pain exacerbation left knee  Injection  Procedure note left knee injection   verbal consent was obtained to inject left knee joint  Timeout was completed to confirm the site of injection  The medications used were 40 mg of Depo-Medrol and 1% lidocaine 3 cc  Anesthesia was provided by ethyl chloride and the skin was prepped with alcohol.  After cleaning the skin with alcohol a 20-gauge needle was used to inject the left knee joint. There were no complications. A sterile bandage was applied.

## 2020-02-01 ENCOUNTER — Other Ambulatory Visit: Payer: Self-pay | Admitting: Nurse Practitioner

## 2020-02-01 DIAGNOSIS — Z86010 Personal history of colonic polyps: Secondary | ICD-10-CM

## 2020-02-01 DIAGNOSIS — K219 Gastro-esophageal reflux disease without esophagitis: Secondary | ICD-10-CM

## 2020-02-02 NOTE — Telephone Encounter (Signed)
[name], refilling X 1. Needs office visit.

## 2020-02-05 ENCOUNTER — Encounter: Payer: Self-pay | Admitting: Gastroenterology

## 2020-02-05 NOTE — Telephone Encounter (Signed)
Sent patient letter to schedule appointment

## 2020-05-07 DIAGNOSIS — M25562 Pain in left knee: Secondary | ICD-10-CM | POA: Diagnosis not present

## 2020-05-10 DIAGNOSIS — D473 Essential (hemorrhagic) thrombocythemia: Secondary | ICD-10-CM | POA: Diagnosis not present

## 2020-05-10 DIAGNOSIS — E785 Hyperlipidemia, unspecified: Secondary | ICD-10-CM | POA: Diagnosis not present

## 2020-05-21 ENCOUNTER — Other Ambulatory Visit: Payer: Self-pay | Admitting: Gastroenterology

## 2020-05-21 DIAGNOSIS — Z86010 Personal history of colonic polyps: Secondary | ICD-10-CM

## 2020-05-21 DIAGNOSIS — K219 Gastro-esophageal reflux disease without esophagitis: Secondary | ICD-10-CM

## 2020-05-23 ENCOUNTER — Other Ambulatory Visit: Payer: Self-pay | Admitting: Nurse Practitioner

## 2020-05-23 ENCOUNTER — Telehealth: Payer: Self-pay | Admitting: Internal Medicine

## 2020-05-23 DIAGNOSIS — Z86010 Personal history of colonic polyps: Secondary | ICD-10-CM

## 2020-05-23 DIAGNOSIS — K219 Gastro-esophageal reflux disease without esophagitis: Secondary | ICD-10-CM

## 2020-05-23 MED ORDER — OMEPRAZOLE 20 MG PO CPDR: 20.0000 mg | DELAYED_RELEASE_CAPSULE | Freq: Every day | ORAL | 1 refills | Status: DC

## 2020-05-23 NOTE — Telephone Encounter (Signed)
It has been just under 2 years. I will send a refill to last until appointment in October

## 2020-05-23 NOTE — Telephone Encounter (Signed)
Lmom for pt or wife to return my call.

## 2020-05-23 NOTE — Telephone Encounter (Signed)
Pt's wife called to say that patient's omeprazole had been denied and wanted to know why. Please call [phone]

## 2020-05-23 NOTE — Telephone Encounter (Signed)
Pts spouse return call. Pt was scheduled a follow up with Walden Field, NP. Pts spouse is asking if pt can have a refill prior to his appointment. Pt is currently out of Omeprazole.

## 2020-05-24 NOTE — Telephone Encounter (Signed)
Left a detailed message for pts spouse as directed per pts spouse.

## 2020-05-28 ENCOUNTER — Encounter: Payer: Self-pay | Admitting: Internal Medicine

## 2020-05-29 DIAGNOSIS — E785 Hyperlipidemia, unspecified: Secondary | ICD-10-CM | POA: Diagnosis not present

## 2020-05-29 DIAGNOSIS — I10 Essential (primary) hypertension: Secondary | ICD-10-CM | POA: Diagnosis not present

## 2020-07-03 ENCOUNTER — Ambulatory Visit: Payer: BC Managed Care – PPO | Admitting: Orthopedic Surgery

## 2020-07-16 ENCOUNTER — Ambulatory Visit: Payer: BC Managed Care – PPO | Admitting: Nurse Practitioner

## 2020-07-17 ENCOUNTER — Ambulatory Visit: Payer: BC Managed Care – PPO | Admitting: Internal Medicine

## 2020-07-22 ENCOUNTER — Other Ambulatory Visit: Payer: Self-pay

## 2020-07-22 ENCOUNTER — Ambulatory Visit: Payer: BC Managed Care – PPO | Admitting: Orthopedic Surgery

## 2020-07-22 ENCOUNTER — Encounter: Payer: Self-pay | Admitting: Orthopedic Surgery

## 2020-07-22 VITALS — BP 143/94 | HR 93 | Ht 68.0 in | Wt 248.0 lb

## 2020-07-22 DIAGNOSIS — M1712 Unilateral primary osteoarthritis, left knee: Secondary | ICD-10-CM

## 2020-07-22 DIAGNOSIS — M25562 Pain in left knee: Secondary | ICD-10-CM

## 2020-07-22 DIAGNOSIS — G8929 Other chronic pain: Secondary | ICD-10-CM

## 2020-07-22 MED ORDER — DICLOFENAC SODIUM 75 MG PO TBEC: DELAYED_RELEASE_TABLET | ORAL | 1 refills | Status: DC

## 2020-07-22 NOTE — Progress Notes (Signed)
Chief Complaint  Patient presents with  . Knee Pain    left knee pain follow up.    Encounter Diagnoses  Name Primary?  . Primary osteoarthritis of left knee   . Chronic pain of left knee Yes    Kysen was seen today for knee pain.  Diagnoses and all orders for this visit:  Chronic pain of left knee -     diclofenac (VOLTAREN) 75 MG EC tablet; TAKE 1 TABLET(75 MG) BY MOUTH TWICE DAILY  Primary osteoarthritis of left knee -     diclofenac (VOLTAREN) 75 MG EC tablet; TAKE 1 TABLET(75 MG) BY MOUTH TWICE DAILY    Cliffton gets a good report today his leg looks good he is doing well with the Voltaren he is okay to have his FMLA papers state that he will need 1-2 encounters of being out of work 2 to 3 days of his knee acts out  He is a 54 year old male chronic knee pain from arthritis left knee is on Voltaren 75 twice daily doing well other than the fact that the knee is stiff in the morning and cold air or cold weather increases his pain but otherwise he is functioning very well  Exam shows a well-developed well-nourished male he has an endomorphic body habitus his knee is moving well there is no effusion on the left side he has good stability to the knee and no ligamentous instability

## 2020-07-22 NOTE — Patient Instructions (Signed)
Continue voltaren  Continue light exercises

## 2020-07-24 ENCOUNTER — Telehealth: Payer: Self-pay | Admitting: Orthopedic Surgery

## 2020-07-24 NOTE — Telephone Encounter (Signed)
Called patient to notify forms, completed by Dr Aline Brochure, are ready for pick up. If patient requests to fax, release/authorization form will be needed.

## 2020-07-25 ENCOUNTER — Ambulatory Visit: Payer: BC Managed Care – PPO | Admitting: Internal Medicine

## 2020-07-25 ENCOUNTER — Telehealth: Payer: Self-pay | Admitting: *Deleted

## 2020-07-25 ENCOUNTER — Encounter: Payer: Self-pay | Admitting: Orthopedic Surgery

## 2020-07-25 ENCOUNTER — Other Ambulatory Visit: Payer: Self-pay

## 2020-07-25 ENCOUNTER — Encounter: Payer: Self-pay | Admitting: Internal Medicine

## 2020-07-25 VITALS — BP 148/88 | HR 89 | Temp 97.7°F | Ht 68.5 in | Wt 255.2 lb

## 2020-07-25 DIAGNOSIS — Z86010 Personal history of colonic polyps: Secondary | ICD-10-CM | POA: Diagnosis not present

## 2020-07-25 DIAGNOSIS — K219 Gastro-esophageal reflux disease without esophagitis: Secondary | ICD-10-CM | POA: Diagnosis not present

## 2020-07-25 MED ORDER — OMEPRAZOLE 40 MG PO CPDR: 40.0000 mg | DELAYED_RELEASE_CAPSULE | Freq: Every day | ORAL | 3 refills | Status: DC

## 2020-07-25 MED ORDER — OMEPRAZOLE 20 MG PO CPDR: 20.0000 mg | DELAYED_RELEASE_CAPSULE | Freq: Two times a day (BID) | ORAL | 3 refills | Status: DC

## 2020-07-25 NOTE — Patient Instructions (Signed)
I will increase your omeprazole to 20 mg twice daily.  Take the second dose before dinnertime or bedtime depending on your symptoms.  I have sent in a prescription for a year supply.  Follow-up in 1 year or sooner if needed.  We will plan on repeat colonoscopy in 2025.  We may perform EGD at that time.  Lifestyle and home remedies TO MANAGE REFLUX/HEARTBURN    You may eliminate or reduce the frequency of heartburn by making the following lifestyle changes:    Control your weight. Being overweight is a major risk factor for heartburn and GERD. Excess pounds put pressure on your abdomen, pushing up your stomach and causing acid to back up into your esophagus.     Eat smaller meals. 4 TO 6 MEALS A DAY. This reduces pressure on the lower esophageal sphincter, helping to prevent the valve from opening and acid from washing back into your esophagus.      Loosen your belt. Clothes that fit tightly around your waist put pressure on your abdomen and the lower esophageal sphincter.      Eliminate heartburn triggers. Everyone has specific triggers. Common triggers such as fatty or fried foods, spicy food, tomato sauce, carbonated beverages, alcohol, chocolate, mint, garlic, onion, caffeine and nicotine may make heartburn worse.     Avoid stooping or bending. Tying your shoes is OK. Bending over for longer periods to weed your garden isn't, especially soon after eating.     Don't lie down after a meal. Wait at least three to four hours after eating before going to bed, and don't lie down right after eating.   At [facility] we value your feedback. You may receive a survey about your visit today. Please share your experience as we strive to create trusting relationships with our patients to provide genuine, compassionate, quality care.  We appreciate your understanding and patience as we review any laboratory studies, imaging, and other diagnostic tests that are ordered as we care  for you. Our office policy is 5 business days for review of these results, and any emergent or urgent results are addressed in a timely manner for your best interest. If you do not hear from our office in 1 week, please contact us.   We also encourage the use of MyChart, which contains your medical information for your review as well. If you are not enrolled in this feature, an access code is on this after visit summary for your convenience. Thank you for allowing [place] to be involved in your care.  It was great to see you today!  I hope you have a great rest of your fall!!   Dustine Bertini K. Abbey Chatters, D.O. Gastroenterology and Hepatology [facility]

## 2020-07-25 NOTE — Addendum Note (Signed)
Addended by: Eloise Harman on: 07/25/2020 04:19 PM   Modules accepted: Orders

## 2020-07-25 NOTE — Telephone Encounter (Signed)
Spoke to pt's wife and she informed us that pt's insurance requires PA for Omeprazole twice daily.  She requested RX for Omeprazole once daily for now.  Spoke with Dr. Abbey Chatters and he sent RX to pharmacy for Omeprazole once daily.  Pt would like [place] to do PA for Omeprazole twice daily in the future.  Awaiting insurance information from pharmacy.  Routing to Dr. Abbey Chatters as Juluis Rainier and Emelda Brothers, CMA to address PA in future.

## 2020-07-25 NOTE — Progress Notes (Signed)
Referring Provider: Asencion Noble, MD Primary Care Physician:  Asencion Noble, MD Primary GI:  Dr. Abbey Chatters  Chief Complaint  Patient presents with  . Gastroesophageal Reflux    takes omep 20mg  QD has been out x 2 days, will still have some reflux with taking daily. Out of meds x2 days    HPI:   Brandon Burton is a 54 y.o. male who presents to the clinic for follow-up visit.  He has chronic reflux which is well maintained on omeprazole 20 mg daily.  He states this helps until approximately 5 or 6:00 at night and his symptoms then worsen.  No dysphagia or odynophagia.  No chronic NSAID use.  No history of PUD H. pylori that he knows of.  No previous upper endoscopy.  Last colonoscopy 2020 relatively unremarkable although he does have at history of adenomatous colon polyps in the past.  Otherwise he had no other complaints.  Past Medical History:  Diagnosis Date  . GERD (gastroesophageal reflux disease)   . Hypertension     Past Surgical History:  Procedure Laterality Date  . COLONOSCOPY N/A 07/22/2015   SLF:7 polyps removed/mild diverticulosis/moderate internal hemorrhoids  . COLONOSCOPY N/A 11/18/2018   Procedure: COLONOSCOPY;  Surgeon: Danie Binder, MD;  [facility];  Service: Endoscopy;  Laterality: N/A;  2:15pm  . SHOULDER SURGERY Left 2015   fix previous clavicular fracture complication    Current Outpatient Medications  Medication Sig Dispense Refill  . amLODipine (NORVASC) 10 MG tablet Take 10 mg by mouth daily.    . diclofenac (VOLTAREN) 75 MG EC tablet TAKE 1 TABLET(75 MG) BY MOUTH TWICE DAILY 180 tablet 1  . tadalafil (ADCIRCA/CIALIS) 20 MG tablet     . omeprazole (PRILOSEC) 20 MG capsule Take 1 capsule (20 mg total) by mouth 2 (two) times daily before a meal. 180 capsule 3   No current facility-administered medications for this visit.    Allergies as of 07/25/2020  . (No Known Allergies)    Family History  Problem Relation Age of Onset  . Thyroid  cancer Mother   . Heart attack Father   . Colon cancer Neg Hx   . Colon polyps Neg Hx     Social History   Socioeconomic History  . Marital status: Married    Spouse name: Not on file  . Number of children: Not on file  . Years of education: Not on file  . Highest education level: Not on file  Occupational History  . Not on file  Tobacco Use  . Smoking status: Light Tobacco Smoker    Types: Cigars  . Smokeless tobacco: Never Used  . Tobacco comment: weekend / social  Vaping Use  . Vaping Use: Never used  Substance and Sexual Activity  . Alcohol use: Yes    Alcohol/week: 0.0 standard drinks    Comment: social, every 1-2 months approx 1 drink per occasion  . Drug use: No  . Sexual activity: Not on file  Other Topics Concern  . Not on file  Social History Narrative  . Not on file   Social Determinants of Health   Financial Resource Strain:   . Difficulty of Paying Living Expenses: Not on file  Food Insecurity:   . Worried About Charity fundraiser in the Last Year: Not on file  . Ran Out of Food in the Last Year: Not on file  Transportation Needs:   . Lack of Transportation (Medical): Not on file  .  Lack of Transportation (Non-Medical): Not on file  Physical Activity:   . Days of Exercise per Week: Not on file  . Minutes of Exercise per Session: Not on file  Stress:   . Feeling of Stress : Not on file  Social Connections:   . Frequency of Communication with Friends and Family: Not on file  . Frequency of Social Gatherings with Friends and Family: Not on file  . Attends Religious Services: Not on file  . Active Member of Clubs or Organizations: Not on file  . Attends Archivist Meetings: Not on file  . Marital Status: Not on file    Subjective: Review of Systems  Constitutional: Negative for chills and fever.  HENT: Negative for congestion and hearing loss.   Eyes: Negative for blurred vision and double vision.  Respiratory: Negative for cough and  shortness of breath.   Cardiovascular: Negative for chest pain and palpitations.  Gastrointestinal: Positive for heartburn. Negative for abdominal pain, blood in stool, constipation, diarrhea, melena and vomiting.  Genitourinary: Negative for dysuria and urgency.  Musculoskeletal: Negative for joint pain and myalgias.  Skin: Negative for itching and rash.  Neurological: Negative for dizziness and headaches.  Psychiatric/Behavioral: Negative for depression. The patient is not nervous/anxious.      Objective: BP (!) 148/88   Pulse 89   Temp 97.7 F (36.5 C)   Ht 5' 8.5" (1.74 m)   Wt 255 lb 3.2 oz (115.8 kg)   BMI 38.24 kg/m  Physical Exam Constitutional:      Appearance: Normal appearance.  HENT:     Head: Normocephalic and atraumatic.  Eyes:     Extraocular Movements: Extraocular movements intact.     Conjunctiva/sclera: Conjunctivae normal.  Cardiovascular:     Rate and Rhythm: Normal rate and regular rhythm.  Pulmonary:     Effort: Pulmonary effort is normal.     Breath sounds: Normal breath sounds.  Abdominal:     General: Bowel sounds are normal.     Palpations: Abdomen is soft.  Musculoskeletal:        General: Normal range of motion.     Cervical back: Normal range of motion and neck supple.  Skin:    General: Skin is warm.  Neurological:     General: No focal deficit present.     Mental Status: He is alert and oriented to person, place, and time.  Psychiatric:        Mood and Affect: Mood normal.        Behavior: Behavior normal.      Assessment: *Chronic reflux-relatively well controlled with breakthrough night symptoms *History of adenomatous colon polyps  Plan: I will increase patient's omeprazole to 20 mg twice daily.  He can take the nightly dose other 30 minutes before dinner or 30 minutes before bedtime.  He has no alarm symptoms at present.  If his symptoms worsen or he develops dysphagia/odynophagia, chest pain, melena/hematochezia, weight loss,  we may need to consider upper endoscopy.  Colonoscopy recall 2025 due to history of colon polyps.  Patient follow-up in 1 year or sooner if needed.  07/25/2020 3:52 PM   Disclaimer: This note was dictated with voice recognition software. Similar sounding words can inadvertently be transcribed and may not be corrected upon review.

## 2020-08-06 NOTE — Telephone Encounter (Signed)
noted 

## 2020-08-09 ENCOUNTER — Telehealth: Payer: Self-pay | Admitting: Orthopedic Surgery

## 2020-08-09 NOTE — Telephone Encounter (Signed)
Called patient, left message regarding form has been updated by Dr Aline Brochure; ready for pick up.

## 2020-10-02 DIAGNOSIS — L820 Inflamed seborrheic keratosis: Secondary | ICD-10-CM | POA: Diagnosis not present

## 2020-10-02 DIAGNOSIS — L821 Other seborrheic keratosis: Secondary | ICD-10-CM | POA: Diagnosis not present

## 2020-10-02 DIAGNOSIS — L918 Other hypertrophic disorders of the skin: Secondary | ICD-10-CM | POA: Diagnosis not present

## 2021-01-14 DIAGNOSIS — H2512 Age-related nuclear cataract, left eye: Secondary | ICD-10-CM | POA: Diagnosis not present

## 2021-01-20 ENCOUNTER — Ambulatory Visit: Payer: BC Managed Care – PPO | Admitting: Orthopedic Surgery

## 2021-01-27 DIAGNOSIS — K219 Gastro-esophageal reflux disease without esophagitis: Secondary | ICD-10-CM | POA: Diagnosis not present

## 2021-01-27 DIAGNOSIS — M1990 Unspecified osteoarthritis, unspecified site: Secondary | ICD-10-CM | POA: Diagnosis not present

## 2021-01-27 DIAGNOSIS — I10 Essential (primary) hypertension: Secondary | ICD-10-CM | POA: Diagnosis not present

## 2021-01-27 DIAGNOSIS — Z79899 Other long term (current) drug therapy: Secondary | ICD-10-CM | POA: Diagnosis not present

## 2021-01-27 DIAGNOSIS — H2512 Age-related nuclear cataract, left eye: Secondary | ICD-10-CM | POA: Diagnosis not present

## 2021-04-22 DIAGNOSIS — M7061 Trochanteric bursitis, right hip: Secondary | ICD-10-CM | POA: Diagnosis not present

## 2021-05-05 ENCOUNTER — Ambulatory Visit: Payer: BC Managed Care – PPO | Admitting: Orthopedic Surgery

## 2021-05-05 ENCOUNTER — Encounter: Payer: Self-pay | Admitting: Orthopedic Surgery

## 2021-05-05 ENCOUNTER — Ambulatory Visit: Payer: BC Managed Care – PPO

## 2021-05-05 ENCOUNTER — Other Ambulatory Visit: Payer: Self-pay

## 2021-05-05 VITALS — BP 148/98 | HR 84 | Ht 68.0 in | Wt 250.8 lb

## 2021-05-05 DIAGNOSIS — M5127 Other intervertebral disc displacement, lumbosacral region: Secondary | ICD-10-CM

## 2021-05-05 DIAGNOSIS — M25511 Pain in right shoulder: Secondary | ICD-10-CM | POA: Diagnosis not present

## 2021-05-05 MED ORDER — METHOCARBAMOL 500 MG PO TABS: 500.0000 mg | ORAL_TABLET | Freq: Three times a day (TID) | ORAL | 1 refills | Status: DC

## 2021-05-05 MED ORDER — GABAPENTIN 100 MG PO CAPS: 100.0000 mg | ORAL_CAPSULE | Freq: Three times a day (TID) | ORAL | 2 refills | Status: DC

## 2021-05-05 MED ORDER — PREDNISONE 10 MG (48) PO TBPK: ORAL_TABLET | Freq: Every day | ORAL | 1 refills | Status: DC

## 2021-05-05 NOTE — Patient Instructions (Signed)
Rest   OOW X 2 weeks

## 2021-05-05 NOTE — Progress Notes (Signed)
Chief Complaint  Patient presents with   Knee Pain    Left    Shoulder Pain    Right s/p fall 3-4 weeks ago   Back Pain    Pain runs all way down right leg numbness in top of foot   55 yo male with chronic knee pain (left) presents with a 50-monthhistory of lower back pain which is localized around his right hip and is now progressed to include radicular pain to his right foot associated with numbness on the plantar aspect and some dorsum of the foot as well as weakness in plantarflexion  He also fell on his right shoulder landed on an outstretched hand felt something grinding when he fell and now complains of pain and decreased range of motion  Examination of the right shoulder reveals active abduction of 90 degrees active flexion 135 degrees with normal cuff strength in flexion and abduction.  We also noted him to have painful range of motion throughout the arc  His back is tender straight leg raise is positive at 60 degrees with radicular reproduction his reflexes are 0-1+ bilaterally has weak pushoff strength in plantarflexion  Decreased range of motion is noted in the lumbar spine with pain on extension as well  Imaging  Lumbar  Right shoulder  Encounter Diagnoses  Name Primary?   Herniation of intervertebral disc between L5 and S1 Yes   Acute pain of right shoulder     Recommend Medical management Meds ordered this encounter  Medications   predniSONE (STERAPRED UNI-PAK 48 TAB) 10 MG (48) TBPK tablet    Sig: Take by mouth daily. 12 days ds as directed    Dispense:  48 tablet    Refill:  1   gabapentin (NEURONTIN) 100 MG capsule    Sig: Take 1 capsule (100 mg total) by mouth 3 (three) times daily.    Dispense:  90 capsule    Refill:  2   methocarbamol (ROBAXIN) 500 MG tablet    Sig: Take 1 tablet (500 mg total) by mouth 3 (three) times daily.    Dispense:  60 tablet    Refill:  1    Relative rest Right shoulder injection  Procedure note  Injection  Verbal  consent was obtained to inject the  right shoulder   Timeout procedure was completed to confirm injection site  Diagnosis acute pain right shoulder   Medications used [place]  Lidocaine 1% plain 3 cc  Anesthesia was provided by ethyl chloride spray  Prep was performed with alcohol  Technique of injection  posterior approach to the subacromial space  No complications were noted

## 2021-05-19 ENCOUNTER — Other Ambulatory Visit: Payer: Self-pay

## 2021-05-19 ENCOUNTER — Encounter: Payer: Self-pay | Admitting: Orthopedic Surgery

## 2021-05-19 ENCOUNTER — Ambulatory Visit: Payer: BC Managed Care – PPO | Admitting: Orthopedic Surgery

## 2021-05-19 DIAGNOSIS — M5127 Other intervertebral disc displacement, lumbosacral region: Secondary | ICD-10-CM | POA: Diagnosis not present

## 2021-05-19 DIAGNOSIS — M25511 Pain in right shoulder: Secondary | ICD-10-CM | POA: Diagnosis not present

## 2021-05-19 MED ORDER — PREDNISONE 10 MG (48) PO TBPK: ORAL_TABLET | Freq: Every day | ORAL | 1 refills | Status: DC

## 2021-05-19 NOTE — Progress Notes (Signed)
Chief Complaint  Patient presents with   Back Pain    Disc protrusion/follow up 2 weeks/pt states its getting better    Current Outpatient Medications:    amLODipine (NORVASC) 10 MG tablet, Take 10 mg by mouth daily., Disp: , Rfl:    diclofenac (VOLTAREN) 75 MG EC tablet, TAKE 1 TABLET(75 MG) BY MOUTH TWICE DAILY, Disp: 180 tablet, Rfl: 1   gabapentin (NEURONTIN) 100 MG capsule, Take 1 capsule (100 mg total) by mouth 3 (three) times daily., Disp: 90 capsule, Rfl: 2   methocarbamol (ROBAXIN) 500 MG tablet, Take 1 tablet (500 mg total) by mouth 3 (three) times daily., Disp: 60 tablet, Rfl: 1   omeprazole (PRILOSEC) 40 MG capsule, Take 1 capsule (40 mg total) by mouth daily., Disp: 90 capsule, Rfl: 3   predniSONE (STERAPRED UNI-PAK 48 TAB) 10 MG (48) TBPK tablet, Take by mouth daily. 12 days ds as directed, Disp: 48 tablet, Rfl: 1   tadalafil (ADCIRCA/CIALIS) 20 MG tablet, , Disp: , Rfl:   Past Medical History:  Diagnosis Date   GERD (gastroesophageal reflux disease)    Hypertension     Teancum has improved he says he is about halfway there he is able to sleep better his pain now only radiates to the buttocks and back of the upper thigh his gait has improved he has had no complications with his medication he is on the prednisone Dosepak the Robaxin and the gabapentin which we will continue and refill the Dosepak  Return in 2 weeks and continue out of work for 2 more weeks  Meds ordered this encounter  Medications   predniSONE (STERAPRED UNI-PAK 48 TAB) 10 MG (48) TBPK tablet    Sig: Take by mouth daily. 12 days ds as directed, 10 mg    Dispense:  48 tablet    Refill:  1

## 2021-06-05 ENCOUNTER — Other Ambulatory Visit: Payer: Self-pay

## 2021-06-05 ENCOUNTER — Ambulatory Visit: Payer: BC Managed Care – PPO | Admitting: Orthopedic Surgery

## 2021-06-05 ENCOUNTER — Encounter: Payer: Self-pay | Admitting: Orthopedic Surgery

## 2021-06-05 VITALS — BP 131/90 | HR 80 | Ht 68.0 in | Wt 252.5 lb

## 2021-06-05 DIAGNOSIS — M5127 Other intervertebral disc displacement, lumbosacral region: Secondary | ICD-10-CM

## 2021-06-05 NOTE — Progress Notes (Signed)
Chief Complaint  Patient presents with   Back Pain    Doing a lot better. Still have a little pain in right hip, back of right thigh and still some numbness in toes.    Brandon Burton has been treated with prednisone Robaxin gabapentin and he is better in terms of his back pain but he still having right leg numbness especially if he walks about a mile  He should have an MRI done of his back now probably has a herniation at L5-S1  His back is less tender but is straight leg raise is still positive and he has a 0-1+ right reflex compared to left  Pushoff strength still weak in plantarflexion  Decreased range of motion with worse pain with flexion   Encounter Diagnosis  Name Primary?   Herniation of intervertebral disc between L5 and S1 Yes

## 2021-06-05 NOTE — Patient Instructions (Signed)
No work x 2 weeks

## 2021-06-09 ENCOUNTER — Encounter: Payer: Self-pay | Admitting: Internal Medicine

## 2021-06-16 ENCOUNTER — Telehealth: Payer: Self-pay | Admitting: Radiology

## 2021-06-16 DIAGNOSIS — M47817 Spondylosis without myelopathy or radiculopathy, lumbosacral region: Secondary | ICD-10-CM | POA: Diagnosis not present

## 2021-06-16 DIAGNOSIS — M4807 Spinal stenosis, lumbosacral region: Secondary | ICD-10-CM | POA: Diagnosis not present

## 2021-06-16 DIAGNOSIS — M47816 Spondylosis without myelopathy or radiculopathy, lumbar region: Secondary | ICD-10-CM | POA: Diagnosis not present

## 2021-06-16 DIAGNOSIS — M48061 Spinal stenosis, lumbar region without neurogenic claudication: Secondary | ICD-10-CM | POA: Diagnosis not present

## 2021-06-16 NOTE — Telephone Encounter (Signed)
I called him told him to let him know he needs to schedule the MRI before the auth runs out beginning of October. Had to leave message  Inez Catalina told me she already gave him the number to schedule, but I didn't see it in his AVS

## 2021-06-19 ENCOUNTER — Telehealth: Payer: Self-pay | Admitting: Orthopedic Surgery

## 2021-06-19 NOTE — Telephone Encounter (Signed)
Patient called to inquire about his appointment for today for MRI results-states was to be seen in 2 weeks - AVS appears to indicate as typically, for patient to call when he has MRI appointment scheduled. Patient fe missed putting on schedule today, 06/19/21. Please advise if virtual is option or if a time may be given for today. (Patient also has forms pending MRI results)

## 2021-06-19 NOTE — Telephone Encounter (Signed)
Dr Aline Brochure, I will put printed MRI report in your inbox.  Please let us know if in person appt needed. Pt cell is [phone]. Thanks.

## 2021-06-19 NOTE — Telephone Encounter (Signed)
Called back to patient; reached voice mail, left message to return call.

## 2021-06-19 NOTE — Telephone Encounter (Signed)
Patient/spouse, designated contact, called back; again relays did have the MRI - done at [facility]. Results located in [place]. Abigail Butts spoke with patient; will re-check with Dr Aline Brochure; appointment pending.

## 2021-06-20 ENCOUNTER — Telehealth: Payer: Self-pay | Admitting: Orthopedic Surgery

## 2021-06-20 ENCOUNTER — Telehealth: Payer: Self-pay | Admitting: Radiology

## 2021-06-20 DIAGNOSIS — M5127 Other intervertebral disc displacement, lumbosacral region: Secondary | ICD-10-CM

## 2021-06-20 NOTE — Telephone Encounter (Signed)
-----   Message from Carole Civil, MD sent at 06/20/2021 10:30 AM EDT ----- Regarding: referral Make a referral to Dr Lorin Mercy  MRI was at [place] is in the media section

## 2021-06-24 ENCOUNTER — Telehealth: Payer: Self-pay | Admitting: Orthopedic Surgery

## 2021-06-24 NOTE — Telephone Encounter (Signed)
Called to give results left message on answering service

## 2021-06-27 ENCOUNTER — Telehealth: Payer: Self-pay | Admitting: Orthopedic Surgery

## 2021-06-27 NOTE — Telephone Encounter (Signed)
-----   Message from Carole Civil, MD sent at 06/24/2021  4:56 PM EDT ----- Call to give results

## 2021-06-27 NOTE — Telephone Encounter (Signed)
I spoke to [name] today he has an appointment with neurosurgery on October 6 will call me back and schedule appointment with me after that  I do not feel comfortable sending him back to work with his heavy labor with the MRI report that we received  He says he still feels pretty good but still has the numbness when he is walking

## 2021-06-30 NOTE — Telephone Encounter (Signed)
No additional notes

## 2021-07-03 ENCOUNTER — Other Ambulatory Visit: Payer: Self-pay

## 2021-07-03 ENCOUNTER — Encounter: Payer: Self-pay | Admitting: Orthopaedic Surgery

## 2021-07-03 ENCOUNTER — Ambulatory Visit (INDEPENDENT_AMBULATORY_CARE_PROVIDER_SITE_OTHER): Payer: BC Managed Care – PPO | Admitting: Orthopaedic Surgery

## 2021-07-03 DIAGNOSIS — M48061 Spinal stenosis, lumbar region without neurogenic claudication: Secondary | ICD-10-CM

## 2021-07-03 NOTE — Progress Notes (Signed)
   Office Visit Note   Patient: Brandon Burton           Date of Birth: [birth year]           MRN: [id number] Visit Date: 07/03/2021              Requested by: Carole Civil, MD [address] [place],  [place] [address] PCP: Asencion Noble, MD   Assessment & Plan: Visit Diagnoses: No diagnosis found.  Plan: ROV with MRI disc  Follow-Up Instructions: No follow-ups on file.   Orders:  No orders of the defined types were placed in this encounter.  No orders of the defined types were placed in this encounter.     Procedures: No procedures performed   Clinical Data: No additional findings.   Subjective: Chief Complaint  Patient presents with   Lower Back - Pain    HPIpt did not bring MRI will reschedule.   Review of Systems   Objective: Vital Signs: Ht 5\' 8"  (1.727 m)   Wt 252 lb (114.3 kg)   BMI 38.32 kg/m   Physical Exam  Ortho Exam  Specialty Comments:  No specialty comments available.  Imaging: No results found.   PMFS History: Patient Active Problem List   Diagnosis Date Noted   History of colonic polyps 08/16/2018   GERD (gastroesophageal reflux disease) 07/08/2015   Rectal bleeding 07/08/2015   ANKLE SPRAIN 09/10/2009   CLOSED FRACTURE OF CALCANEUS 08/27/2009   SHOULDER STRAIN 02/27/2009   Past Medical History:  Diagnosis Date   GERD (gastroesophageal reflux disease)    Hypertension     Family History  Problem Relation Age of Onset   Thyroid cancer Mother    Heart attack Father    Colon cancer Neg Hx    Colon polyps Neg Hx     Past Surgical History:  Procedure Laterality Date   COLONOSCOPY N/A 07/22/2015   SLF:7 polyps removed/mild diverticulosis/moderate internal hemorrhoids   COLONOSCOPY N/A 11/18/2018   Procedure: COLONOSCOPY;  Surgeon: Danie Binder, MD;  [facility];  Service: Endoscopy;  Laterality: N/A;  2:15pm   SHOULDER SURGERY Left 2015   fix previous clavicular fracture complication   Social  History   Occupational History   Not on file  Tobacco Use   Smoking status: Light Smoker    Types: Cigars   Smokeless tobacco: Never   Tobacco comments:    weekend / social  Vaping Use   Vaping Use: Never used  Substance and Sexual Activity   Alcohol use: Yes    Alcohol/week: 0.0 standard drinks    Comment: social, every 1-2 months approx 1 drink per occasion   Drug use: No   Sexual activity: Not on file

## 2021-07-04 ENCOUNTER — Ambulatory Visit (INDEPENDENT_AMBULATORY_CARE_PROVIDER_SITE_OTHER): Payer: BC Managed Care – PPO | Admitting: Orthopaedic Surgery

## 2021-07-04 ENCOUNTER — Encounter: Payer: Self-pay | Admitting: Orthopaedic Surgery

## 2021-07-04 ENCOUNTER — Other Ambulatory Visit: Payer: Self-pay

## 2021-07-04 VITALS — BP 148/87 | Ht 68.0 in | Wt 252.0 lb

## 2021-07-04 DIAGNOSIS — M48061 Spinal stenosis, lumbar region without neurogenic claudication: Secondary | ICD-10-CM | POA: Diagnosis not present

## 2021-07-04 DIAGNOSIS — M5116 Intervertebral disc disorders with radiculopathy, lumbar region: Secondary | ICD-10-CM

## 2021-07-04 NOTE — Progress Notes (Addendum)
Office Visit Note   Patient: Brandon Burton           Date of Birth: 22-Oct-1965           MRN: [id number] Visit Date: 07/04/2021              Requested by: Carole Civil, [place],  [place] [address] PCP: Asencion Noble, MD   Assessment & Plan: Visit Diagnoses:  1. Spinal stenosis of lumbar region, unspecified whether neurogenic claudication present   2. Lumbar disc herniation with radiculopathy     Plan: We reviewed MRI scan that he brought in on disc from [facility].  He has severe stenosis at 3 levels with disc herniation from L4-5 with caudally migrated.  He has multifactorial stenosis with short pedicles, ligamentum thickening and some foraminal stenosis but it rates only moderate.  Patient will bring his wife for next visit.  Currently he is able to walk 1 mile before has to stop sit and rest.  His job involves a lot of turning twisting and some lifting.  He remains out of work and will return in 6 days with his wife for discussion about possible surgical options.  Follow-Up Instructions: No follow-ups on file.   Orders:  No orders of the defined types were placed in this encounter.  No orders of the defined types were placed in this encounter.     Procedures: No procedures performed   Clinical Data: No additional findings.   Subjective: Chief Complaint  Patient presents with   Lower Back - Pain    HPI 55 year old male referred by Dr. Ples Specter for lumbar stenosis and herniated fragment from the L4-5 level caudally positioned behind L5 with severe 3 level lumbar spinal stenosis.  Patient has short pedicles and MRI scan done at [facility] on 06/17/2021 showed severe central and moderate biforaminal stenosis at L4-5 severe central L3-4 stenosis and moderate to advanced central stenosis at L5-S1.  There was fragment from the L4-5 disc caudally migrated with compression.  Fragment is located in the lower third of L5 vertebral body.   Patient's had left gastrocs weakness for some time.  He has a neurogenic claudication symptoms after he walks a mile.  He works at [employer] that [employer] and does maintenance work as a Retail buyer.  He does some sweeping and cleaning etc.  Patient has been out of work since 05/01/2021.  He has right leg numbness since February.  He noticed when he was at a [employer] party his right leg was getting progressively numb.  Patient played multiple sports when he was in high school.  He initially got better and then when he started walking he started having some increased pain and numbness.  Patient does have some hypertension acid reflux.  Days married does not smoke.  Patient played softball but has not been able to play in the last year.  Review of Systems positive for hypertension reflux and lumbar spinal stenosis.  All other systems noncontributory to HPI.  No chills or fever no bowel bladder symptoms.   Objective: Vital Signs: BP (!) 148/87   Ht 5\' 8"  (1.727 m)   Wt 252 lb (114.3 kg)   BMI 38.32 kg/m   Physical Exam Constitutional:      Appearance: He is well-developed.  HENT:     Head: Normocephalic and atraumatic.     Right Ear: External ear normal.     Left Ear: External ear  normal.  Eyes:     Pupils: Pupils are equal, round, and reactive to light.  Neck:     Thyroid: No thyromegaly.     Trachea: No tracheal deviation.  Cardiovascular:     Rate and Rhythm: Normal rate.  Pulmonary:     Effort: Pulmonary effort is normal.     Breath sounds: No wheezing.  Abdominal:     General: Bowel sounds are normal.     Palpations: Abdomen is soft.  Musculoskeletal:     Cervical back: Neck supple.  Skin:    General: Skin is warm and dry.     Capillary Refill: Capillary refill takes less than 2 seconds.  Neurological:     Mental Status: He is alert and oriented to person, place, and time.  Psychiatric:        Behavior: Behavior normal.        Thought Content: Thought  content normal.        Judgment: Judgment normal.    Ortho Exam patient cannot toe walk on the left he can on the right.  Resisted anterior tib and EHL function right left is strong.  Negative logroll hips he has pain with straight leg raising right left at 90 degrees.  Ankle jerk right and left is absent knee jerk is 1+ and symmetrical.  Cannot single stance toe raise on the left he can do it on the right.  Peroneals weak on the left normal on the right.   Specialty Comments:  No specialty comments available.  Imaging: Impression  IMPRESSION:  L4/L5 severe central canal and moderate bilateral foraminal stenosis.   L3/L4 severe central canal and mild bilateral foraminal stenosis.   L5/S1 moderate to advanced central canal and moderate bilateral foraminal stenosis.   L2/L3 mild central canal stenosis.   T10/T11 disc herniation closely associated with the distal aspect of the cord without significant cord deformity or signal abnormality.   Multilevel facet degenerative changes.     Electronically Signed by: Levy Sjogren on 06/17/2021 4:23 PM Narrative  INDICATION: Low back pain, unspecified  COMPARISON: None.  TECHNIQUE: Multiplanar, multisequence MR imaging obtained through the lumbar spine without contrast.   FINDINGS:  Bones:  Vertebral body heights are maintained. No acute fracture. No focal lesions.  Spinal cord/conus: No abnormal signal or mass.  Conus terminates at normal level.  Alignment: No significant subluxation.   Degenerative changes:  T10-T11: Disc herniation is closely associated with the distal aspect of the cord without visualized cord deformity. Imaged on sagittal planes only.   T11-T12: Posteriorly projecting disc osteophyte complex with partial effacement of ventral thecal sac. Moderate bilateral foraminal stenosis. Mild disc space height loss. Imaged on sagittal planes only.   T12-L1: Normal disc bulge with trace effacement of the ventral thecal sac.  Minimal bilateral foraminal stenosis.   L1-L2: No significant foraminal or central canal stenosis.  No disc herniation.     L2-L3: Minimal disc bulge. Mild facet degenerative changes with minimal facet joint effusions. Mild central canal and minimal bilateral foraminal stenosis.     L3-L4: Small disc bulge with superimposed central disc extrusion exerting cephalad along the inferior most margin of the L3 body. Mild to moderate bilateral facet degenerative changes with associated facet joint effusions. Severe central canal stenosis with effacement of CSF seen surrounding the crowded nerve roots within the central canal. Mild bilateral foraminal stenosis. Mild disc space height loss.   L4-L5: Broad-based disc bulge with superimposed central disc extrusion extending caudally to the lower  1/3 aspect of the L5 body. Mild/moderate bilateral facet degenerative changes ligamentum flavum hypertrophy. Severe central canal stenosis with effacement of CSF seen surrounding the crowded nerve roots within the central canal. Moderate bilateral foraminal stenosis. Mild disc space height loss.     L5-S1: Circumferential disc osteophyte complex. Moderate to advanced stenosis central canal stenosis with extensive effacement of CSF seen surrounding the carotid nerve roots within the central canal. Moderate bilateral foraminal stenosis with disc osteophyte complex is closely associated with the exiting L5 roots. Disc space height loss with Modic type II endplate degenerative changes.   Paraspinal soft tissues: Unremarkable     PMFS History: Patient Active Problem List   Diagnosis Date Noted   Spinal stenosis of lumbar region 07/04/2021   Lumbar disc herniation with radiculopathy 07/04/2021   History of colonic polyps 08/16/2018   GERD (gastroesophageal reflux disease) 07/08/2015   Rectal bleeding 07/08/2015   ANKLE SPRAIN 09/10/2009   CLOSED FRACTURE OF CALCANEUS 08/27/2009   SHOULDER STRAIN 02/27/2009   Past  Medical History:  Diagnosis Date   GERD (gastroesophageal reflux disease)    Hypertension     Family History  Problem Relation Age of Onset   Thyroid cancer Mother    Heart attack Father    Colon cancer Neg Hx    Colon polyps Neg Hx     Past Surgical History:  Procedure Laterality Date   COLONOSCOPY N/A 07/22/2015   SLF:7 polyps removed/mild diverticulosis/moderate internal hemorrhoids   COLONOSCOPY N/A 11/18/2018   Procedure: COLONOSCOPY;  Surgeon: Danie Binder, MD;  Location: [facility];  Service: Endoscopy;  Laterality: N/A;  2:15pm   SHOULDER SURGERY Left 2015   fix previous clavicular fracture complication   Social History   Occupational History   Not on file  Tobacco Use   Smoking status: Light Smoker    Types: Cigars   Smokeless tobacco: Never   Tobacco comments:    weekend / social  Vaping Use   Vaping Use: Never used  Substance and Sexual Activity   Alcohol use: Yes    Alcohol/week: 0.0 standard drinks    Comment: social, every 1-2 months approx 1 drink per occasion   Drug use: No   Sexual activity: Not on file

## 2021-07-10 ENCOUNTER — Other Ambulatory Visit: Payer: Self-pay

## 2021-07-10 ENCOUNTER — Ambulatory Visit (INDEPENDENT_AMBULATORY_CARE_PROVIDER_SITE_OTHER): Payer: BC Managed Care – PPO | Admitting: Orthopedic Surgery

## 2021-07-10 ENCOUNTER — Encounter: Payer: Self-pay | Admitting: Orthopedic Surgery

## 2021-07-10 VITALS — BP 140/83 | HR 88 | Ht 68.0 in | Wt 255.2 lb

## 2021-07-10 DIAGNOSIS — M48061 Spinal stenosis, lumbar region without neurogenic claudication: Secondary | ICD-10-CM

## 2021-07-10 NOTE — Progress Notes (Signed)
FOLLOW UP   Encounter Diagnosis  Name Primary?   Spinal stenosis of lumbar region, unspecified whether neurogenic claudication present Yes     Chief Complaint  Patient presents with   Back Pain    Still having numbness when walking a lot. Here to go over what Dr. Lorin Mercy' visit.     Brandon Burton has seen Dr. Inda Merlin and surgery was recommended  He wanted to get my opinion  I am in agreement with Dr. Lorin Mercy that the best chance for him to recover is to proceed with surgical decompression  He and I had a nice discussion about the decompression I tried to explain it to him as best I could we talked about foraminotomy and laminotomy and moving the nerve side of the way to get to the disc and make more room for the neuroanatomy  He is comfortable with that and we will schedule surgery at his convenience

## 2021-07-17 ENCOUNTER — Ambulatory Visit: Payer: BC Managed Care – PPO | Admitting: Orthopaedic Surgery

## 2021-07-17 ENCOUNTER — Other Ambulatory Visit: Payer: Self-pay

## 2021-07-21 ENCOUNTER — Encounter: Payer: Self-pay | Admitting: Orthopedic Surgery

## 2021-07-24 ENCOUNTER — Ambulatory Visit (INDEPENDENT_AMBULATORY_CARE_PROVIDER_SITE_OTHER): Payer: BC Managed Care – PPO | Admitting: Orthopaedic Surgery

## 2021-07-24 ENCOUNTER — Other Ambulatory Visit: Payer: Self-pay

## 2021-07-24 DIAGNOSIS — M48061 Spinal stenosis, lumbar region without neurogenic claudication: Secondary | ICD-10-CM | POA: Diagnosis not present

## 2021-07-24 NOTE — Progress Notes (Signed)
Office Visit Note   Patient: Brandon Burton           Date of Birth: [birth year]           MRN: [id number] Visit Date: 07/24/2021              Requested by: Asencion Noble, MD [address] [place],  [place] [address] PCP: Asencion Noble, MD   Assessment & Plan: Visit Diagnoses:  1. Spinal stenosis of lumbar region, unspecified whether neurogenic claudication present     Plan: Patient has an appointment last week but showed up late and was not able to be seen.  He will make an appointment in December since he wants to have surgery in January.  Work slip given for work resumption 08/07/2021 no restrictions.  We discussed procedure looked at spine model and discussed multifactoral stenosis.  He will bring his disc when he comes back for his office visit with his wife in December to discuss setting up surgery in January.  Follow-Up Instructions: Return in about 8 weeks (around 09/18/2021).   Orders:  No orders of the defined types were placed in this encounter.  No orders of the defined types were placed in this encounter.     Procedures: No procedures performed   Clinical Data: No additional findings.   Subjective: Chief Complaint  Patient presents with   Lower Back - Follow-up    HPI 55 year old male returns with ongoing problems with back pain with leg weakness when he standing or walking.  He states he cannot play softball since he can only run about halfway to first base and legs get weak and he cannot keep moving.  MRI scan showed severe stenosis at L3-4 and L4-5.  Patient's been very anxious about surgery.  He states he would like to resume work 08/07/2021 and weight to have the surgery in January.  He states he talked with Dr. Ples Specter who recommended that he proceed with surgery and today wanted to return and discuss surgical technique postop activities, overnight stay in the hospital etc. No bowel bladder symptoms no chills or fever.  Review of Systems all the  systems updated unchanged.   Objective: Vital Signs: There were no vitals taken for this visit.  Physical Exam Constitutional:      Appearance: He is well-developed.  HENT:     Head: Normocephalic and atraumatic.     Right Ear: External ear normal.     Left Ear: External ear normal.  Eyes:     Pupils: Pupils are equal, round, and reactive to light.  Neck:     Thyroid: No thyromegaly.     Trachea: No tracheal deviation.  Cardiovascular:     Rate and Rhythm: Normal rate.  Pulmonary:     Effort: Pulmonary effort is normal.     Breath sounds: No wheezing.  Abdominal:     General: Bowel sounds are normal.     Palpations: Abdomen is soft.  Musculoskeletal:     Cervical back: Neck supple.  Skin:    General: Skin is warm and dry.     Capillary Refill: Capillary refill takes less than 2 seconds.  Neurological:     Mental Status: He is alert and oriented to person, place, and time.  Psychiatric:        Behavior: Behavior normal.        Thought Content: Thought content normal.        Judgment: Judgment normal.    Ortho Exam patient  can ambulate negative straight leg raising 90 degrees anterior tib EHL gastrocsoleus is intact.  Negative logroll the hips.  Specialty Comments:  No specialty comments available.  Imaging: No results found.   PMFS History: Patient Active Problem List   Diagnosis Date Noted   Spinal stenosis of lumbar region 07/04/2021   Lumbar disc herniation with radiculopathy 07/04/2021   History of colonic polyps 08/16/2018   GERD (gastroesophageal reflux disease) 07/08/2015   Rectal bleeding 07/08/2015   ANKLE SPRAIN 09/10/2009   CLOSED FRACTURE OF CALCANEUS 08/27/2009   SHOULDER STRAIN 02/27/2009   Past Medical History:  Diagnosis Date   GERD (gastroesophageal reflux disease)    Hypertension     Family History  Problem Relation Age of Onset   Thyroid cancer Mother    Heart attack Father    Colon cancer Neg Hx    Colon polyps Neg Hx     Past  Surgical History:  Procedure Laterality Date   COLONOSCOPY N/A 07/22/2015   SLF:7 polyps removed/mild diverticulosis/moderate internal hemorrhoids   COLONOSCOPY N/A 11/18/2018   Procedure: COLONOSCOPY;  Surgeon: Danie Binder, MD;  Location: [facility];  Service: Endoscopy;  Laterality: N/A;  2:15pm   SHOULDER SURGERY Left 2015   fix previous clavicular fracture complication   Social History   Occupational History   Not on file  Tobacco Use   Smoking status: Light Smoker    Types: Cigars   Smokeless tobacco: Never   Tobacco comments:    weekend / social  Vaping Use   Vaping Use: Never used  Substance and Sexual Activity   Alcohol use: Yes    Alcohol/week: 0.0 standard drinks    Comment: social, every 1-2 months approx 1 drink per occasion   Drug use: No   Sexual activity: Not on file

## 2021-07-25 ENCOUNTER — Other Ambulatory Visit: Payer: Self-pay | Admitting: Internal Medicine

## 2021-07-31 ENCOUNTER — Ambulatory Visit: Payer: BC Managed Care – PPO | Admitting: Orthopedic Surgery

## 2021-08-06 ENCOUNTER — Encounter: Payer: Self-pay | Admitting: Internal Medicine

## 2021-08-07 ENCOUNTER — Ambulatory Visit: Payer: BC Managed Care – PPO | Admitting: Orthopedic Surgery

## 2021-08-27 ENCOUNTER — Telehealth: Payer: Self-pay | Admitting: Radiology

## 2021-08-27 NOTE — Telephone Encounter (Signed)
Note entered

## 2021-08-27 NOTE — Telephone Encounter (Signed)
Baldo Ash called patient to advise.

## 2021-08-27 NOTE — Telephone Encounter (Signed)
Patient's wife called [place] office and states that you had advised he could try to return to work and see how he did. He was working 12 hours a day, has decreased to 8 hours a day, and still cannot make it. He has made an appointment on 09/04/2021 to discuss scheduling the lumbar spine surgery. The would like to know if you could please go ahead and take him out of work beginning Friday, December 2, until after he recovers from surgery.  [phone]  Please advise.

## 2021-09-03 ENCOUNTER — Telehealth: Payer: Self-pay | Admitting: Orthopaedic Surgery

## 2021-09-03 ENCOUNTER — Ambulatory Visit: Payer: BC Managed Care – PPO | Admitting: Internal Medicine

## 2021-09-03 NOTE — Telephone Encounter (Signed)
[employer] forms received. To [employer]. 

## 2021-09-04 ENCOUNTER — Ambulatory Visit (INDEPENDENT_AMBULATORY_CARE_PROVIDER_SITE_OTHER): Payer: BC Managed Care – PPO | Admitting: Orthopaedic Surgery

## 2021-09-04 ENCOUNTER — Encounter: Payer: Self-pay | Admitting: Orthopaedic Surgery

## 2021-09-04 ENCOUNTER — Other Ambulatory Visit: Payer: Self-pay

## 2021-09-04 VITALS — Ht 68.0 in | Wt 255.0 lb

## 2021-09-04 DIAGNOSIS — M48062 Spinal stenosis, lumbar region with neurogenic claudication: Secondary | ICD-10-CM

## 2021-09-04 NOTE — Progress Notes (Signed)
Office Visit Note   Patient: Brandon Burton           Date of Birth: 08-Oct-1965           MRN: [id number] Visit Date: 09/04/2021              Requested by: Asencion Noble, MD [address] [place],  [place] [address] PCP: Asencion Noble, MD   Assessment & Plan: Visit Diagnoses:  1. Spinal stenosis of lumbar region with neurogenic claudication     Plan: We will have patient return after the holidays week and then discussed operative intervention once again.  We discussed operative technique postoperative normal recovery times and activities.  He understands that he has some moderate narrowing at other levels but plan would be L3-4 and L4-5 decompression.  Patient's wife was present with him and included with the discussion.  Questions elicited and answered.  Follow-Up Instructions: Return in about 1 month (around 10/05/2021).   Orders:  No orders of the defined types were placed in this encounter.  No orders of the defined types were placed in this encounter.     Procedures: No procedures performed   Clinical Data: No additional findings.   Subjective: Chief Complaint  Patient presents with   Lower Back - Pain    HPI 55 year old male returns with two-level severe stenosis L3-4 and L4-5 with neurogenic claudication symptoms.  He still working has been concerned about surgery and states he wants to wait till after the holidays to consider proceeding with surgery.  He is talked with Dr. Ples Specter who also recommended proceeding with surgery.  He is continue to work currently he gets relief with sitting.  Increased pain with prolonged standing or walking.  He states he sits down after about a block.  Review of Systems all the systems update are negative noncontributory.  No bowel or bladder associated symptoms.   Objective: Vital Signs: Ht 5\' 8"  (1.727 m)   Wt 255 lb (115.7 kg)   BMI 38.77 kg/m   Physical Exam Constitutional:      Appearance: He is well-developed.   HENT:     Head: Normocephalic and atraumatic.     Right Ear: External ear normal.     Left Ear: External ear normal.  Eyes:     Pupils: Pupils are equal, round, and reactive to light.  Neck:     Thyroid: No thyromegaly.     Trachea: No tracheal deviation.  Cardiovascular:     Rate and Rhythm: Normal rate.  Pulmonary:     Effort: Pulmonary effort is normal.     Breath sounds: No wheezing.  Abdominal:     General: Bowel sounds are normal.     Palpations: Abdomen is soft.  Musculoskeletal:     Cervical back: Neck supple.  Skin:    General: Skin is warm and dry.     Capillary Refill: Capillary refill takes less than 2 seconds.  Neurological:     Mental Status: He is alert and oriented to person, place, and time.  Psychiatric:        Behavior: Behavior normal.        Thought Content: Thought content normal.        Judgment: Judgment normal.    Ortho Exam anterior tib gastrocsoleus is strong.  Reflexes are symmetrical negative logroll the hips.  Distal pulses are normal.  Specialty Comments:  No specialty comments available.  Imaging: Impression  IMPRESSION:  L4/L5 severe central canal and moderate  bilateral foraminal stenosis.   L3/L4 severe central canal and mild bilateral foraminal stenosis.   L5/S1 moderate to advanced central canal and moderate bilateral foraminal stenosis.   L2/L3 mild central canal stenosis.   T10/T11 disc herniation closely associated with the distal aspect of the cord without significant cord deformity or signal abnormality.   Multilevel facet degenerative changes.     Electronically Signed by: Levy Sjogren on 06/17/2021 4:23 PM Narrative  This result has an attachment that is not available.  INDICATION: Low back pain, unspecified  COMPARISON: None.  TECHNIQUE: Multiplanar, multisequence MR imaging obtained through the lumbar spine without contrast.   FINDINGS:  Bones:  Vertebral body heights are maintained. No acute fracture. No focal  lesions.  Spinal cord/conus: No abnormal signal or mass.  Conus terminates at normal level.  Alignment: No significant subluxation.   Degenerative changes:  T10-T11: Disc herniation is closely associated with the distal aspect of the cord without visualized cord deformity. Imaged on sagittal planes only.   T11-T12: Posteriorly projecting disc osteophyte complex with partial effacement of ventral thecal sac. Moderate bilateral foraminal stenosis. Mild disc space height loss. Imaged on sagittal planes only.   T12-L1: Normal disc bulge with trace effacement of the ventral thecal sac. Minimal bilateral foraminal stenosis.   L1-L2: No significant foraminal or central canal stenosis.  No disc herniation.     L2-L3: Minimal disc bulge. Mild facet degenerative changes with minimal facet joint effusions. Mild central canal and minimal bilateral foraminal stenosis.     L3-L4: Small disc bulge with superimposed central disc extrusion exerting cephalad along the inferior most margin of the L3 body. Mild to moderate bilateral facet degenerative changes with associated facet joint effusions. Severe central canal stenosis with effacement of CSF seen surrounding the crowded nerve roots within the central canal. Mild bilateral foraminal stenosis. Mild disc space height loss.   L4-L5: Broad-based disc bulge with superimposed central disc extrusion extending caudally to the lower 1/3 aspect of the L5 body. Mild/moderate bilateral facet degenerative changes ligamentum flavum hypertrophy. Severe central canal stenosis with effacement of CSF seen surrounding the crowded nerve roots within the central canal. Moderate bilateral foraminal stenosis. Mild disc space height loss.     L5-S1: Circumferential disc osteophyte complex. Moderate to advanced stenosis central canal stenosis with extensive effacement of CSF seen surrounding the carotid nerve roots within the central canal. Moderate bilateral foraminal stenosis with  disc osteophyte complex is closely associated with the exiting L5 roots. Disc space height loss with Modic type II endplate degenerative changes.   Paraspinal soft tissues: Unremarkable     PMFS History: Patient Active Problem List   Diagnosis Date Noted   Spinal stenosis of lumbar region 07/04/2021   Lumbar disc herniation with radiculopathy 07/04/2021   History of colonic polyps 08/16/2018   GERD (gastroesophageal reflux disease) 07/08/2015   Rectal bleeding 07/08/2015   ANKLE SPRAIN 09/10/2009   CLOSED FRACTURE OF CALCANEUS 08/27/2009   SHOULDER STRAIN 02/27/2009   Past Medical History:  Diagnosis Date   GERD (gastroesophageal reflux disease)    Hypertension     Family History  Problem Relation Age of Onset   Thyroid cancer Mother    Heart attack Father    Colon cancer Neg Hx    Colon polyps Neg Hx     Past Surgical History:  Procedure Laterality Date   COLONOSCOPY N/A 07/22/2015   SLF:7 polyps removed/mild diverticulosis/moderate internal hemorrhoids   COLONOSCOPY N/A 11/18/2018   Procedure: COLONOSCOPY;  Surgeon: Oneida Alar,  Marga Melnick, MD;  Location: [facility];  Service: Endoscopy;  Laterality: N/A;  2:15pm   SHOULDER SURGERY Left 2015   fix previous clavicular fracture complication   Social History   Occupational History   Not on file  Tobacco Use   Smoking status: Light Smoker    Types: Cigars   Smokeless tobacco: Never   Tobacco comments:    weekend / social  Vaping Use   Vaping Use: Never used  Substance and Sexual Activity   Alcohol use: Yes    Alcohol/week: 0.0 standard drinks    Comment: social, every 1-2 months approx 1 drink per occasion   Drug use: No   Sexual activity: Not on file

## 2021-09-08 ENCOUNTER — Ambulatory Visit (INDEPENDENT_AMBULATORY_CARE_PROVIDER_SITE_OTHER): Payer: BC Managed Care – PPO | Admitting: Orthopedic Surgery

## 2021-09-08 ENCOUNTER — Other Ambulatory Visit: Payer: Self-pay

## 2021-09-08 DIAGNOSIS — G8929 Other chronic pain: Secondary | ICD-10-CM | POA: Diagnosis not present

## 2021-09-08 DIAGNOSIS — M1712 Unilateral primary osteoarthritis, left knee: Secondary | ICD-10-CM

## 2021-09-08 DIAGNOSIS — M25562 Pain in left knee: Secondary | ICD-10-CM

## 2021-09-08 NOTE — Progress Notes (Signed)
Chief Complaint  Patient presents with   Knee Pain    L/painful all the time/ wanted it checked out before my back surgery cause of physical therapy.    Brandon Burton wants an injection before he has his back surgery so that he will be able to perform his exercises which will include walking  The left knee looks suitable for injection  The patient consented for injection and timeout was done to confirm the injection site as the left knee  Medication 40 mg of Depo-Medrol and 3 cc of 1% lidocaine  Skin prep alcohol  Anesthesia ethyl chloride  Knee was placed at 90 degrees of flexion cleaned with alcohol sprayed with ethyl chloride and then injected with the medication from a lateral approach.  Follow-up as needed  Encounter Diagnoses  Name Primary?   Chronic pain of left knee Yes   Primary osteoarthritis of left knee

## 2021-09-11 ENCOUNTER — Other Ambulatory Visit: Payer: Self-pay | Admitting: Orthopedic Surgery

## 2021-09-11 DIAGNOSIS — M25562 Pain in left knee: Secondary | ICD-10-CM

## 2021-09-11 DIAGNOSIS — M1712 Unilateral primary osteoarthritis, left knee: Secondary | ICD-10-CM

## 2021-09-18 ENCOUNTER — Ambulatory Visit: Payer: BC Managed Care – PPO | Admitting: Internal Medicine

## 2021-10-09 ENCOUNTER — Other Ambulatory Visit: Payer: Self-pay

## 2021-10-09 ENCOUNTER — Encounter: Payer: Self-pay | Admitting: Orthopaedic Surgery

## 2021-10-09 ENCOUNTER — Ambulatory Visit (INDEPENDENT_AMBULATORY_CARE_PROVIDER_SITE_OTHER): Payer: BC Managed Care – PPO | Admitting: Orthopaedic Surgery

## 2021-10-09 VITALS — Ht 68.0 in | Wt 256.0 lb

## 2021-10-09 DIAGNOSIS — M48062 Spinal stenosis, lumbar region with neurogenic claudication: Secondary | ICD-10-CM

## 2021-10-09 NOTE — Progress Notes (Signed)
Office Visit Note   Patient: Brandon Burton           Date of Birth: 02-10-1966           MRN: [id number] Visit Date: 10/09/2021              Requested by: Asencion Noble, MD [address] [place],  [place] [address] PCP: Asencion Noble, MD   Assessment & Plan: Visit Diagnoses:  1. Spinal stenosis of lumbar region with neurogenic claudication     Plan: Patient will require decompression 3 segments L3, L4, and L5 for spinal stenosis.  Operative microscope be used.  Risk of dural tear with spinal fluid leakage and repair, infection, anesthesia risks discussed.  Potential for progression and recurrence of stenosis with time.  He understands that he may develop progressive problems at other levels other than those that would include an surgery at this time.  Questions elicited and answered patient is here along with his family and they are including discussion and are advising him to proceed.[id number] and [id number]  Follow-Up Instructions: No follow-ups on file.   Orders:  No orders of the defined types were placed in this encounter.  No orders of the defined types were placed in this encounter.     Procedures: No procedures performed   Clinical Data: No additional findings.   Subjective: Chief Complaint  Patient presents with   Lower Back - Follow-up    HPI 56 year old male returns with two-level severe stenosis L3-4 and L4-5.  We have followed him for many months and decompression surgery has been offered.  He has had gradual progression of symptoms and now states he is ready to proceed with surgery.  He has moderate narrowing at L5-S1 and he understands at some point that might become a problem.  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  He is also seen Dr. Ples Specter who had recommended he proceed with surgery.  He has not had any falling denies bowel bladder symptoms but has neurogenic claudication symptoms with increased pain after standing or  short ambulation.  He gets relief with sitting.  Pain relief leaning over a grocery cart.  Review of Systems all other systems noncontributory to HPI.   Objective: Vital Signs: Ht 5\' 8"  (1.727 m)    Wt 256 lb (116.1 kg)    BMI 38.92 kg/m   Physical Exam Constitutional:      Appearance: He is well-developed.  HENT:     Head: Normocephalic and atraumatic.     Right Ear: External ear normal.     Left Ear: External ear normal.  Eyes:     Pupils: Pupils are equal, round, and reactive to light.  Neck:     Thyroid: No thyromegaly.     Trachea: No tracheal deviation.  Cardiovascular:     Rate and Rhythm: Normal rate.  Pulmonary:     Effort: Pulmonary effort is normal.     Breath sounds: No wheezing.  Abdominal:     General: Bowel sounds are normal.     Palpations: Abdomen is soft.  Musculoskeletal:     Cervical back: Neck supple.  Skin:    General: Skin is warm and dry.     Capillary Refill: Capillary refill takes less than 2 seconds.  Neurological:     Mental Status: He is alert and oriented to person, place, and time.  Psychiatric:        Behavior: Behavior normal.  Thought Content: Thought content normal.        Judgment: Judgment normal.    Ortho Exam patient can heel and toe walk.  Anterior tib gastrocsoleus is intact negative straight leg raising 90 degrees.  Negative logroll of the hips dorsalis pedis posterior tib pulses normal.  Knees reach full extension good quad strength.  Specialty Comments:  No specialty comments available.  Imaging:   Imaging: Impression   IMPRESSION:  L4/L5 severe central canal and moderate bilateral foraminal stenosis.   L3/L4 severe central canal and mild bilateral foraminal stenosis.   L5/S1 moderate to advanced central canal and moderate bilateral foraminal stenosis.   L2/L3 mild central canal stenosis.   T10/T11 disc herniation closely associated with the distal aspect of the cord without significant cord deformity or  signal abnormality.   Multilevel facet degenerative changes.     Electronically Signed by: Levy Sjogren on 06/17/2021 4:23 PM Narrative   This result has an attachment that is not available.  INDICATION: Low back pain, unspecified  COMPARISON: None.  TECHNIQUE: Multiplanar, multisequence MR imaging obtained through the lumbar spine without contrast.   FINDINGS:  Bones:  Vertebral body heights are maintained. No acute fracture. No focal lesions.  Spinal cord/conus: No abnormal signal or mass.  Conus terminates at normal level.  Alignment: No significant subluxation.   Degenerative changes:  T10-T11: Disc herniation is closely associated with the distal aspect of the cord without visualized cord deformity. Imaged on sagittal planes only.   T11-T12: Posteriorly projecting disc osteophyte complex with partial effacement of ventral thecal sac. Moderate bilateral foraminal stenosis. Mild disc space height loss. Imaged on sagittal planes only.   T12-L1: Normal disc bulge with trace effacement of the ventral thecal sac. Minimal bilateral foraminal stenosis.   L1-L2: No significant foraminal or central canal stenosis.  No disc herniation.     L2-L3: Minimal disc bulge. Mild facet degenerative changes with minimal facet joint effusions. Mild central canal and minimal bilateral foraminal stenosis.     L3-L4: Small disc bulge with superimposed central disc extrusion exerting cephalad along the inferior most margin of the L3 body. Mild to moderate bilateral facet degenerative changes with associated facet joint effusions. Severe central canal stenosis with effacement of CSF seen surrounding the crowded nerve roots within the central canal. Mild bilateral foraminal stenosis. Mild disc space height loss.   L4-L5: Broad-based disc bulge with superimposed central disc extrusion extending caudally to the lower 1/3 aspect of the L5 body. Mild/moderate bilateral facet degenerative changes ligamentum flavum  hypertrophy. Severe central canal stenosis with effacement of CSF seen surrounding the crowded nerve roots within the central canal. Moderate bilateral foraminal stenosis. Mild disc space height loss.     L5-S1: Circumferential disc osteophyte complex. Moderate to advanced stenosis central canal stenosis with extensive effacement of CSF seen surrounding the carotid nerve roots within the central canal. Moderate bilateral foraminal stenosis with disc osteophyte complex is closely associated with the exiting L5 roots. Disc space height loss with Modic type II endplate degenerative changes.   Paraspinal soft tissues: Unremarkable         PMFS History: Patient Active Problem List   Diagnosis Date Noted   Spinal stenosis of lumbar region 07/04/2021   Lumbar disc herniation with radiculopathy 07/04/2021   History of colonic polyps 08/16/2018   GERD (gastroesophageal reflux disease) 07/08/2015   Rectal bleeding 07/08/2015   ANKLE SPRAIN 09/10/2009   CLOSED FRACTURE OF CALCANEUS 08/27/2009   SHOULDER STRAIN 02/27/2009   Past Medical  History:  Diagnosis Date   GERD (gastroesophageal reflux disease)    Hypertension     Family History  Problem Relation Age of Onset   Thyroid cancer Mother    Heart attack Father    Colon cancer Neg Hx    Colon polyps Neg Hx     Past Surgical History:  Procedure Laterality Date   COLONOSCOPY N/A 07/22/2015   SLF:7 polyps removed/mild diverticulosis/moderate internal hemorrhoids   COLONOSCOPY N/A 11/18/2018   Procedure: COLONOSCOPY;  Surgeon: Danie Binder, MD;  Location: [facility];  Service: Endoscopy;  Laterality: N/A;  2:15pm   SHOULDER SURGERY Left 2015   fix previous clavicular fracture complication   Social History   Occupational History   Not on file  Tobacco Use   Smoking status: Light Smoker    Types: Cigars   Smokeless tobacco: Never   Tobacco comments:    weekend / social  Vaping Use   Vaping Use: Never used  Substance and  Sexual Activity   Alcohol use: Yes    Alcohol/week: 0.0 standard drinks    Comment: social, every 1-2 months approx 1 drink per occasion   Drug use: No   Sexual activity: Not on file

## 2021-11-06 ENCOUNTER — Other Ambulatory Visit: Payer: Self-pay

## 2021-11-20 ENCOUNTER — Ambulatory Visit (INDEPENDENT_AMBULATORY_CARE_PROVIDER_SITE_OTHER): Payer: BC Managed Care – PPO | Admitting: Surgery

## 2021-11-20 ENCOUNTER — Encounter: Payer: Self-pay | Admitting: Surgery

## 2021-11-20 ENCOUNTER — Other Ambulatory Visit: Payer: Self-pay

## 2021-11-20 VITALS — BP 124/80 | HR 78 | Ht 68.0 in | Wt 256.0 lb

## 2021-11-20 DIAGNOSIS — M48062 Spinal stenosis, lumbar region with neurogenic claudication: Secondary | ICD-10-CM

## 2021-11-20 NOTE — Progress Notes (Signed)
56 year old black male history of L3-4 and L4-5 stenosis comes in for preop evaluation.  Patient continues to have ongoing low back pain, bilateral lower extremity radicular symptoms and neurogenic claudication.  He is want to proceed with L3-4 and L4-5 central decompression as scheduled.  Today history and physical performed.  Review of systems negative.  Surgical procedure discussed with patient and family members in great detail along with potential rehab/recovery time.  All questions answered.

## 2021-11-21 NOTE — Pre-Procedure Instructions (Signed)
Surgical Instructions    Your procedure is scheduled on Wednesday, March 1st.  Report to [facility] Main Entrance "A" at 10:30 A.M., then check in with the Admitting office.  Call this number if you have problems the morning of surgery:  [phone]   If you have any questions prior to your surgery date call [phone]: Open Monday-Friday 8am-4pm    Remember:  Do not eat after midnight the night before your surgery  You may drink clear liquids until 9:30 a.m. the morning of your surgery.   Clear liquids allowed are: Water, Non-Citrus Juices (without pulp), Carbonated Beverages, Clear Tea, Black Coffee Only (NO MILK, CREAM OR POWDERED CREAMER of any kind), and Gatorade.   Enhanced Recovery after Surgery for Orthopedics Enhanced Recovery after Surgery is a protocol used to improve the stress on your body and your recovery after surgery.  Patient Instructions  The day of surgery (if you do NOT have diabetes):  Drink ONE (1) Pre-Surgery Clear Ensure by 9:30 am the morning of surgery   This drink was given to you during your hospital  pre-op appointment visit. Nothing else to drink after completing the  Pre-Surgery Clear Ensure.         If you have questions, please contact your surgeons office.     Take these medicines the morning of surgery with A SIP OF WATER  amLODipine (NORVASC)  gabapentin (NEURONTIN) omeprazole (PRILOSEC)  As of today, STOP taking any Aspirin (unless otherwise instructed by your surgeon) Aleve, Naproxen, Ibuprofen, Motrin, Advil, Goody's, BC's, all herbal medications, fish oil, and all vitamins. This includes: diclofenac (VOLTAREN).                     Do NOT Smoke (Tobacco/Vaping) for 24 hours prior to your procedure.  If you use a CPAP at night, you may bring your mask/headgear for your overnight stay.   Contacts, glasses, piercing's, hearing aid's, dentures or partials may not be worn into surgery, please bring cases for these belongings.     For patients admitted to the hospital, discharge time will be determined by your treatment team.   Patients discharged the day of surgery will not be allowed to drive home, and someone needs to stay with them for 24 hours.  NO VISITORS WILL BE ALLOWED IN PRE-OP WHERE PATIENTS ARE PREPPED FOR SURGERY.  ONLY 1 SUPPORT PERSON MAY BE PRESENT IN THE WAITING ROOM WHILE YOU ARE IN SURGERY.  IF YOU ARE TO BE ADMITTED, ONCE YOU ARE IN YOUR ROOM YOU WILL BE ALLOWED TWO (2) VISITORS. (1) VISITOR MAY STAY OVERNIGHT BUT MUST ARRIVE TO THE ROOM BY 8pm.  Minor children may have two parents present. Special consideration for safety and communication needs will be reviewed on a case by case basis.   Special instructions:   [facility]- Preparing For Surgery  Before surgery, you can play an important role. Because skin is not sterile, your skin needs to be as free of germs as possible. You can reduce the number of germs on your skin by washing with CHG (chlorahexidine gluconate) Soap before surgery.  CHG is an antiseptic cleaner which kills germs and bonds with the skin to continue killing germs even after washing.    Oral Hygiene is also important to reduce your risk of infection.  Remember - BRUSH YOUR TEETH THE MORNING OF SURGERY WITH YOUR REGULAR TOOTHPASTE  Please do not use if you have an allergy to CHG or antibacterial soaps. If your skin  becomes reddened/irritated stop using the CHG.  Do not shave (including legs and underarms) for at least 48 hours prior to first CHG shower. It is OK to shave your face.  Please follow these instructions carefully.   Shower the NIGHT BEFORE SURGERY and the MORNING OF SURGERY  If you chose to wash your hair, wash your hair first as usual with your normal shampoo.  After you shampoo, rinse your hair and body thoroughly to remove the shampoo.  Use CHG Soap as you would any other liquid soap. You can apply CHG directly to the skin and wash gently with a scrungie or a  clean washcloth.   Apply the CHG Soap to your body ONLY FROM THE NECK DOWN.  Do not use on open wounds or open sores. Avoid contact with your eyes, ears, mouth and genitals (private parts). Wash Face and genitals (private parts)  with your normal soap.   Wash thoroughly, paying special attention to the area where your surgery will be performed.  Thoroughly rinse your body with warm water from the neck down.  DO NOT shower/wash with your normal soap after using and rinsing off the CHG Soap.  Pat yourself dry with a CLEAN TOWEL.  Wear CLEAN PAJAMAS to bed the night before surgery  Place CLEAN SHEETS on your bed the night before your surgery  DO NOT SLEEP WITH PETS.   Day of Surgery: Shower with CHG soap. Do not wear jewelry. Do not wear lotions, powders, colognes, or deodorant. Men may shave face and neck. Do not bring valuables to the hospital. [facility] is not responsible for any belongings or valuables. Wear Clean/Comfortable clothing the morning of surgery Remember to brush your teeth WITH YOUR REGULAR TOOTHPASTE.   Please read over the following fact sheets that you were given.   3 days prior to your procedure or After your COVID test   You are not required to quarantine however you are required to wear a well-fitting mask when you are out and around people not in your household. If your mask becomes wet or soiled, replace with a new one.   Wash your hands often with soap and water for 20 seconds or clean your hands with an alcohol-based hand sanitizer that contains at least 60% alcohol.   Do not share personal items.   Notify your provider:  o if you are in close contact with someone who has COVID  o or if you develop a fever of 100.4 or greater, sneezing, cough, sore throat, shortness of breath or body aches.

## 2021-11-24 ENCOUNTER — Encounter (HOSPITAL_COMMUNITY): Payer: Self-pay

## 2021-11-24 ENCOUNTER — Other Ambulatory Visit: Payer: Self-pay

## 2021-11-24 ENCOUNTER — Encounter (HOSPITAL_COMMUNITY)
Admission: RE | Admit: 2021-11-24 | Discharge: 2021-11-24 | Disposition: A | Discharge status: Home / Self Care | Payer: BC Managed Care – PPO | Source: Ambulatory Visit | Attending: Orthopaedic Surgery | Admitting: Orthopaedic Surgery

## 2021-11-24 VITALS — BP 153/92 | HR 89 | Temp 98.4°F | Resp 18 | Ht 68.0 in | Wt 254.6 lb

## 2021-11-24 DIAGNOSIS — Z01818 Encounter for other preprocedural examination: Secondary | ICD-10-CM

## 2021-11-24 DIAGNOSIS — Z20822 Contact with and (suspected) exposure to covid-19: Secondary | ICD-10-CM | POA: Insufficient documentation

## 2021-11-24 LAB — COMPREHENSIVE METABOLIC PANEL
ALT: 25 U/L (ref 0–44)
AST: 23 U/L (ref 15–41)
Albumin: 3.6 g/dL (ref 3.5–5.0)
Alkaline Phosphatase: 90 U/L (ref 38–126)
Anion gap: 8 (ref 5–15)
BUN: 11 mg/dL (ref 6–20)
CO2: 25 mmol/L (ref 22–32)
Calcium: 8.6 mg/dL — ABNORMAL LOW (ref 8.9–10.3)
Chloride: 108 mmol/L (ref 98–111)
Creatinine, Ser: 1.17 mg/dL (ref 0.61–1.24)
GFR, Estimated: >60 mL/min (ref >60)
Glucose, Bld: 104 mg/dL — ABNORMAL HIGH (ref 70–99)
Potassium: 3.7 mmol/L (ref 3.5–5.1)
Sodium: 141 mmol/L (ref 135–145)
Total Bilirubin: 0.1 mg/dL — ABNORMAL LOW (ref 0.3–1.2)
Total Protein: 7.5 g/dL (ref 6.5–8.1)

## 2021-11-24 LAB — CBC
HCT: 42.6 % (ref 39.0–52.0)
Hemoglobin: 13.7 g/dL (ref 13.0–17.0)
MCH: 28.2 pg (ref 26.0–34.0)
MCHC: 32.2 g/dL (ref 30.0–36.0)
MCV: 87.8 fL (ref 80.0–100.0)
Platelets: 492 10*3/uL — ABNORMAL HIGH (ref 150–400)
RBC: 4.85 MIL/uL (ref 4.22–5.81)
RDW: 17.2 % — ABNORMAL HIGH (ref 11.5–15.5)
WBC: 8.1 10*3/uL (ref 4.0–10.5)
nRBC: 0 % (ref 0.0–0.2)

## 2021-11-24 LAB — SURGICAL PCR SCREEN
MRSA, PCR: NEGATIVE
Staphylococcus aureus: NEGATIVE

## 2021-11-24 LAB — SARS CORONAVIRUS 2 BY RT PCR (HOSPITAL ORDER, PERFORMED IN ~~LOC~~ HOSPITAL LAB): SARS Coronavirus 2: NEGATIVE

## 2021-11-24 NOTE — Progress Notes (Signed)
PCP - Asencion Noble, MD Cardiologist - denies  PPM/ICD - denies Device Orders - n/a Rep Notified - n/a  Chest x-ray - n/a EKG - 11/24/2021 Stress Test - denies ECHO - denies Cardiac Cath - denies  Sleep Study - denies CPAP - n/a  Fasting Blood Sugar - n/a  Blood Thinner Instructions: n/a  Aspirin Instructions: Patient was instructed: As of today, STOP taking any Aspirin (unless otherwise instructed by your surgeon) Aleve, Naproxen, Ibuprofen, Motrin, Advil, Goody's, BC's, all herbal medications, fish oil, and all vitamins. This includes: diclofenac (VOLTAREN).           ERAS Protcol - yes PRE-SURGERY Ensure - yes, until 09:30 o'clock  COVID TEST- yes, done in PAT on 11/24/2021   Anesthesia review: no  Patient denies shortness of breath, fever, cough and chest pain at PAT appointment   All instructions explained to the patient, with a verbal understanding of the material. Patient agrees to go over the instructions while at home for a better understanding. Patient also instructed to self quarantine after being tested for COVID-19. The opportunity to ask questions was provided.

## 2021-11-26 ENCOUNTER — Other Ambulatory Visit: Payer: Self-pay

## 2021-11-26 ENCOUNTER — Ambulatory Visit (HOSPITAL_COMMUNITY): Payer: BC Managed Care – PPO | Admitting: Certified Registered Nurse Anesthetist

## 2021-11-26 ENCOUNTER — Encounter (HOSPITAL_COMMUNITY): Payer: Self-pay | Admitting: Orthopaedic Surgery

## 2021-11-26 ENCOUNTER — Observation Stay (HOSPITAL_COMMUNITY)
Admission: RE | Admit: 2021-11-26 | Discharge: 2021-11-27 | Disposition: A | Discharge status: Home / Self Care | Payer: BC Managed Care – PPO | Attending: Orthopaedic Surgery | Admitting: Orthopaedic Surgery

## 2021-11-26 ENCOUNTER — Ambulatory Visit (HOSPITAL_COMMUNITY)
Admission: RE | Disposition: A | Discharge status: Home / Self Care | Payer: Self-pay | Source: Home / Self Care | Attending: Orthopaedic Surgery

## 2021-11-26 ENCOUNTER — Ambulatory Visit (HOSPITAL_COMMUNITY): Payer: BC Managed Care – PPO

## 2021-11-26 DIAGNOSIS — M48061 Spinal stenosis, lumbar region without neurogenic claudication: Secondary | ICD-10-CM | POA: Diagnosis not present

## 2021-11-26 DIAGNOSIS — I10 Essential (primary) hypertension: Secondary | ICD-10-CM | POA: Insufficient documentation

## 2021-11-26 DIAGNOSIS — Z79899 Other long term (current) drug therapy: Secondary | ICD-10-CM | POA: Insufficient documentation

## 2021-11-26 DIAGNOSIS — M5116 Intervertebral disc disorders with radiculopathy, lumbar region: Secondary | ICD-10-CM | POA: Insufficient documentation

## 2021-11-26 DIAGNOSIS — Z981 Arthrodesis status: Secondary | ICD-10-CM | POA: Diagnosis not present

## 2021-11-26 DIAGNOSIS — F17290 Nicotine dependence, other tobacco product, uncomplicated: Secondary | ICD-10-CM | POA: Diagnosis not present

## 2021-11-26 DIAGNOSIS — Z419 Encounter for procedure for purposes other than remedying health state, unspecified: Secondary | ICD-10-CM

## 2021-11-26 DIAGNOSIS — Z01818 Encounter for other preprocedural examination: Secondary | ICD-10-CM

## 2021-11-26 DIAGNOSIS — M48062 Spinal stenosis, lumbar region with neurogenic claudication: Secondary | ICD-10-CM | POA: Diagnosis not present

## 2021-11-26 HISTORY — PX: LUMBAR LAMINECTOMY/DECOMPRESSION MICRODISCECTOMY: SHX5026

## 2021-11-26 SURGERY — LUMBAR LAMINECTOMY/DECOMPRESSION MICRODISCECTOMY
Anesthesia: General

## 2021-11-26 MED ORDER — FENTANYL CITRATE (PF) 250 MCG/5ML IJ SOLN
INTRAMUSCULAR | Status: AC
  Filled 2021-11-26: qty 5

## 2021-11-26 MED ORDER — LIDOCAINE 2% (20 MG/ML) 5 ML SYRINGE
INTRAMUSCULAR | Status: DC | PRN
Start: 2021-11-26 — End: 2021-11-26
  Administered 2021-11-26: 60 mg via INTRAVENOUS

## 2021-11-26 MED ORDER — ROCURONIUM BROMIDE 10 MG/ML (PF) SYRINGE
PREFILLED_SYRINGE | INTRAVENOUS | Status: AC
  Filled 2021-11-26: qty 10

## 2021-11-26 MED ORDER — ONDANSETRON HCL 4 MG/2ML IJ SOLN: 4.0000 mg | Freq: Four times a day (QID) | INTRAMUSCULAR | Status: DC | PRN

## 2021-11-26 MED ORDER — ONDANSETRON HCL 4 MG/2ML IJ SOLN
INTRAMUSCULAR | Status: AC
  Filled 2021-11-26: qty 2

## 2021-11-26 MED ORDER — CHLORHEXIDINE GLUCONATE 0.12 % MT SOLN: 15.0000 mL | Freq: Once | OROMUCOSAL | Status: AC

## 2021-11-26 MED ORDER — THROMBIN 5000 UNITS EX SOLR
CUTANEOUS | Status: AC
  Filled 2021-11-26: qty 5000

## 2021-11-26 MED ORDER — THROMBIN 20000 UNITS EX SOLR
CUTANEOUS | Status: AC
  Filled 2021-11-26: qty 20000

## 2021-11-26 MED ORDER — FENTANYL CITRATE (PF) 100 MCG/2ML IJ SOLN
INTRAMUSCULAR | Status: AC
  Filled 2021-11-26: qty 2

## 2021-11-26 MED ORDER — ORAL CARE MOUTH RINSE: 15.0000 mL | Freq: Once | OROMUCOSAL | Status: AC

## 2021-11-26 MED ORDER — ACETAMINOPHEN 650 MG RE SUPP: 650.0000 mg | RECTAL | Status: DC | PRN

## 2021-11-26 MED ORDER — CHLORHEXIDINE GLUCONATE 0.12 % MT SOLN
OROMUCOSAL | Status: AC
  Administered 2021-11-26: 15 mL via OROMUCOSAL
  Filled 2021-11-26: qty 15

## 2021-11-26 MED ORDER — ONDANSETRON HCL 4 MG/2ML IJ SOLN
INTRAMUSCULAR | Status: DC | PRN
Start: 2021-11-26 — End: 2021-11-26
  Administered 2021-11-26: 4 mg via INTRAVENOUS

## 2021-11-26 MED ORDER — PHENOL 1.4 % MT LIQD: 1.0000 | OROMUCOSAL | Status: DC | PRN

## 2021-11-26 MED ORDER — METHOCARBAMOL 500 MG PO TABS: 500.0000 mg | ORAL_TABLET | Freq: Four times a day (QID) | ORAL | Status: DC | PRN

## 2021-11-26 MED ORDER — BUPIVACAINE HCL (PF) 0.25 % IJ SOLN
INTRAMUSCULAR | Status: AC
  Filled 2021-11-26: qty 30

## 2021-11-26 MED ORDER — ACETAMINOPHEN 325 MG PO TABS: 650.0000 mg | ORAL_TABLET | ORAL | Status: DC | PRN

## 2021-11-26 MED ORDER — MENTHOL 3 MG MT LOZG: 1.0000 | LOZENGE | OROMUCOSAL | Status: DC | PRN

## 2021-11-26 MED ORDER — OXYCODONE HCL 5 MG PO TABS
5.0000 mg | ORAL_TABLET | Freq: Once | ORAL | Status: AC | PRN
  Administered 2021-11-26: 5 mg via ORAL

## 2021-11-26 MED ORDER — HYDROMORPHONE HCL 1 MG/ML IJ SOLN: 0.5000 mg | INTRAMUSCULAR | Status: DC | PRN

## 2021-11-26 MED ORDER — ROCURONIUM BROMIDE 10 MG/ML (PF) SYRINGE
PREFILLED_SYRINGE | INTRAVENOUS | Status: AC
  Filled 2021-11-26: qty 20

## 2021-11-26 MED ORDER — SODIUM CHLORIDE 0.9 % IV SOLN: INTRAVENOUS | Status: DC

## 2021-11-26 MED ORDER — DEXMEDETOMIDINE (PRECEDEX) IN NS 20 MCG/5ML (4 MCG/ML) IV SYRINGE
PREFILLED_SYRINGE | INTRAVENOUS | Status: DC | PRN
Start: 2021-11-26 — End: 2021-11-26
  Administered 2021-11-26: 8 ug via INTRAVENOUS

## 2021-11-26 MED ORDER — ONDANSETRON HCL 4 MG PO TABS: 4.0000 mg | ORAL_TABLET | Freq: Four times a day (QID) | ORAL | Status: DC | PRN

## 2021-11-26 MED ORDER — 0.9 % SODIUM CHLORIDE (POUR BTL) OPTIME
TOPICAL | Status: DC | PRN
  Administered 2021-11-26: 1000 mL

## 2021-11-26 MED ORDER — SODIUM CHLORIDE 0.9% FLUSH: 3.0000 mL | INTRAVENOUS | Status: DC | PRN

## 2021-11-26 MED ORDER — THROMBIN 5000 UNITS EX SOLR
OROMUCOSAL | Status: DC | PRN
  Administered 2021-11-26 (×2): 5 mL via TOPICAL

## 2021-11-26 MED ORDER — CEFAZOLIN SODIUM-DEXTROSE 2-4 GM/100ML-% IV SOLN
2.0000 g | INTRAVENOUS | Status: AC
  Administered 2021-11-26: 2 g via INTRAVENOUS
  Filled 2021-11-26: qty 100

## 2021-11-26 MED ORDER — DEXAMETHASONE SODIUM PHOSPHATE 10 MG/ML IJ SOLN
INTRAMUSCULAR | Status: AC
  Filled 2021-11-26: qty 1

## 2021-11-26 MED ORDER — DEXAMETHASONE SODIUM PHOSPHATE 10 MG/ML IJ SOLN
INTRAMUSCULAR | Status: DC | PRN
  Administered 2021-11-26: 10 mg via INTRAVENOUS

## 2021-11-26 MED ORDER — DOCUSATE SODIUM 100 MG PO CAPS
100.0000 mg | ORAL_CAPSULE | Freq: Two times a day (BID) | ORAL | Status: DC
  Administered 2021-11-26 – 2021-11-27 (×2): 100 mg via ORAL
  Filled 2021-11-26 (×2): qty 1

## 2021-11-26 MED ORDER — SODIUM CHLORIDE 0.9% FLUSH
3.0000 mL | Freq: Two times a day (BID) | INTRAVENOUS | Status: DC
  Administered 2021-11-26 – 2021-11-27 (×2): 3 mL via INTRAVENOUS

## 2021-11-26 MED ORDER — OXYCODONE HCL 5 MG/5ML PO SOLN: 5.0000 mg | Freq: Once | ORAL | Status: AC | PRN

## 2021-11-26 MED ORDER — LACTATED RINGERS IV SOLN: INTRAVENOUS | Status: DC

## 2021-11-26 MED ORDER — SODIUM CHLORIDE 0.9 % IV SOLN: 250.0000 mL | INTRAVENOUS | Status: DC

## 2021-11-26 MED ORDER — OXYCODONE HCL 5 MG PO TABS
5.0000 mg | ORAL_TABLET | ORAL | Status: DC | PRN
  Administered 2021-11-26 – 2021-11-27 (×4): 10 mg via ORAL
  Filled 2021-11-26 (×4): qty 2

## 2021-11-26 MED ORDER — MIDAZOLAM HCL 5 MG/5ML IJ SOLN
INTRAMUSCULAR | Status: DC | PRN
  Administered 2021-11-26: 2 mg via INTRAVENOUS

## 2021-11-26 MED ORDER — AMLODIPINE BESYLATE 10 MG PO TABS
10.0000 mg | ORAL_TABLET | Freq: Every day | ORAL | Status: DC
  Administered 2021-11-27: 10 mg via ORAL
  Filled 2021-11-26: qty 1

## 2021-11-26 MED ORDER — AMISULPRIDE (ANTIEMETIC) 5 MG/2ML IV SOLN: 10.0000 mg | Freq: Once | INTRAVENOUS | Status: DC | PRN

## 2021-11-26 MED ORDER — OXYCODONE HCL 5 MG PO TABS
ORAL_TABLET | ORAL | Status: AC
  Filled 2021-11-26: qty 1

## 2021-11-26 MED ORDER — HEMOSTATIC AGENTS (NO CHARGE) OPTIME
TOPICAL | Status: DC | PRN
  Administered 2021-11-26: 1 via TOPICAL

## 2021-11-26 MED ORDER — GABAPENTIN 100 MG PO CAPS
100.0000 mg | ORAL_CAPSULE | Freq: Three times a day (TID) | ORAL | Status: DC
  Administered 2021-11-26 – 2021-11-27 (×3): 100 mg via ORAL
  Filled 2021-11-26 (×3): qty 1

## 2021-11-26 MED ORDER — PROPOFOL 10 MG/ML IV BOLUS
INTRAVENOUS | Status: DC | PRN
  Administered 2021-11-26: 200 mg via INTRAVENOUS

## 2021-11-26 MED ORDER — FENTANYL CITRATE (PF) 250 MCG/5ML IJ SOLN
INTRAMUSCULAR | Status: DC | PRN
  Administered 2021-11-26: 50 ug via INTRAVENOUS
  Administered 2021-11-26: 150 ug via INTRAVENOUS

## 2021-11-26 MED ORDER — METHOCARBAMOL 1000 MG/10ML IJ SOLN
500.0000 mg | Freq: Four times a day (QID) | INTRAVENOUS | Status: DC | PRN
  Filled 2021-11-26: qty 5

## 2021-11-26 MED ORDER — BUPIVACAINE HCL (PF) 0.25 % IJ SOLN
INTRAMUSCULAR | Status: DC | PRN
  Administered 2021-11-26: 10 mL

## 2021-11-26 MED ORDER — POLYETHYLENE GLYCOL 3350 17 G PO PACK: 17.0000 g | PACK | Freq: Every day | ORAL | Status: DC | PRN

## 2021-11-26 MED ORDER — PHENYLEPHRINE 40 MCG/ML (10ML) SYRINGE FOR IV PUSH (FOR BLOOD PRESSURE SUPPORT)
PREFILLED_SYRINGE | INTRAVENOUS | Status: AC
  Filled 2021-11-26: qty 10

## 2021-11-26 MED ORDER — SUGAMMADEX SODIUM 500 MG/5ML IV SOLN
INTRAVENOUS | Status: AC
  Filled 2021-11-26: qty 5

## 2021-11-26 MED ORDER — HYDROMORPHONE HCL 1 MG/ML IJ SOLN
INTRAMUSCULAR | Status: AC
  Filled 2021-11-26: qty 1

## 2021-11-26 MED ORDER — PROPOFOL 10 MG/ML IV BOLUS
INTRAVENOUS | Status: AC
  Filled 2021-11-26: qty 20

## 2021-11-26 MED ORDER — SUGAMMADEX SODIUM 200 MG/2ML IV SOLN
INTRAVENOUS | Status: DC | PRN
  Administered 2021-11-26: 300 mg via INTRAVENOUS

## 2021-11-26 MED ORDER — PANTOPRAZOLE SODIUM 40 MG PO TBEC
40.0000 mg | DELAYED_RELEASE_TABLET | Freq: Every day | ORAL | Status: DC
  Administered 2021-11-27: 40 mg via ORAL
  Filled 2021-11-26 (×2): qty 1

## 2021-11-26 MED ORDER — ROCURONIUM BROMIDE 10 MG/ML (PF) SYRINGE
PREFILLED_SYRINGE | INTRAVENOUS | Status: DC | PRN
  Administered 2021-11-26 (×3): 20 mg via INTRAVENOUS
  Administered 2021-11-26: 80 mg via INTRAVENOUS
  Administered 2021-11-26: 20 mg via INTRAVENOUS

## 2021-11-26 MED ORDER — ONDANSETRON HCL 4 MG/2ML IJ SOLN: 4.0000 mg | Freq: Once | INTRAMUSCULAR | Status: DC | PRN

## 2021-11-26 MED ORDER — HYDROMORPHONE HCL 1 MG/ML IJ SOLN
0.2500 mg | INTRAMUSCULAR | Status: DC | PRN
  Administered 2021-11-26 (×2): 0.5 mg via INTRAVENOUS

## 2021-11-26 MED ORDER — MIDAZOLAM HCL 2 MG/2ML IJ SOLN
INTRAMUSCULAR | Status: AC
  Filled 2021-11-26: qty 2

## 2021-11-26 SURGICAL SUPPLY — 59 items
BAG COUNTER SPONGE SURGICOUNT (BAG) ×4 IMPLANT
BAG SPNG CNTER NS LX DISP (BAG) ×2
BNDG CMPR 5X2 CHSV 1 LYR STRL (GAUZE/BANDAGES/DRESSINGS) ×1
BNDG COHESIVE 2X5 TAN ST LF (GAUZE/BANDAGES/DRESSINGS) ×1 IMPLANT
BUR ROUND FLUTED 4 SOFT TCH (BURR) ×1 IMPLANT
CANISTER SUCT 3000ML PPV (MISCELLANEOUS) ×3 IMPLANT
CLSR STERI-STRIP ANTIMIC 1/2X4 (GAUZE/BANDAGES/DRESSINGS) ×3 IMPLANT
COVER SURGICAL LIGHT HANDLE (MISCELLANEOUS) ×2 IMPLANT
DECANTER SPIKE VIAL GLASS SM (MISCELLANEOUS) ×2 IMPLANT
DRAPE HALF SHEET 40X57 (DRAPES) ×5 IMPLANT
DRAPE MICROSCOPE LEICA (MISCELLANEOUS) ×3 IMPLANT
DRAPE SURG 17X23 STRL (DRAPES) ×2 IMPLANT
DRSG MEPILEX BORDER 4X4 (GAUZE/BANDAGES/DRESSINGS) ×2 IMPLANT
DRSG MEPILEX BORDER 4X8 (GAUZE/BANDAGES/DRESSINGS) ×1 IMPLANT
DRSG PAD ABDOMINAL 8X10 ST (GAUZE/BANDAGES/DRESSINGS) ×2 IMPLANT
DURAPREP 26ML APPLICATOR (WOUND CARE) ×3 IMPLANT
ELECT BLADE 4.0 EZ CLEAN MEGAD (MISCELLANEOUS) ×2
ELECT REM PT RETURN 9FT ADLT (ELECTROSURGICAL) ×2
ELECTRODE BLDE 4.0 EZ CLN MEGD (MISCELLANEOUS) IMPLANT
ELECTRODE REM PT RTRN 9FT ADLT (ELECTROSURGICAL) ×2 IMPLANT
GAUZE SPONGE 4X4 12PLY STRL (GAUZE/BANDAGES/DRESSINGS) ×1 IMPLANT
GLOVE SRG 8 PF TXTR STRL LF DI (GLOVE) ×4 IMPLANT
GLOVE SURG ORTHO LTX SZ7.5 (GLOVE) ×6 IMPLANT
GLOVE SURG UNDER POLY LF SZ7 (GLOVE) ×3 IMPLANT
GLOVE SURG UNDER POLY LF SZ7.5 (GLOVE) ×1 IMPLANT
GLOVE SURG UNDER POLY LF SZ8 (GLOVE) ×4
GOWN STRL REUS W/ TWL LRG LVL3 (GOWN DISPOSABLE) ×4 IMPLANT
GOWN STRL REUS W/ TWL XL LVL3 (GOWN DISPOSABLE) ×2 IMPLANT
GOWN STRL REUS W/TWL 2XL LVL3 (GOWN DISPOSABLE) ×3 IMPLANT
GOWN STRL REUS W/TWL LRG LVL3 (GOWN DISPOSABLE) ×2
GOWN STRL REUS W/TWL XL LVL3 (GOWN DISPOSABLE) ×2
HEMOSTAT POWDER KIT SURGIFOAM (HEMOSTASIS) ×2 IMPLANT
KIT BASIN OR (CUSTOM PROCEDURE TRAY) ×3 IMPLANT
KIT TURNOVER KIT B (KITS) ×3 IMPLANT
MANIFOLD NEPTUNE II (INSTRUMENTS) ×2 IMPLANT
NDL HYPO 25GX1X1/2 BEV (NEEDLE) ×2 IMPLANT
NDL SPNL 18GX3.5 QUINCKE PK (NEEDLE) ×2 IMPLANT
NEEDLE HYPO 25GX1X1/2 BEV (NEEDLE) ×2 IMPLANT
NEEDLE SPNL 18GX3.5 QUINCKE PK (NEEDLE) ×2 IMPLANT
NS IRRIG 1000ML POUR BTL (IV SOLUTION) ×3 IMPLANT
PACK LAMINECTOMY ORTHO (CUSTOM PROCEDURE TRAY) ×3 IMPLANT
PAD ARMBOARD 7.5X6 YLW CONV (MISCELLANEOUS) ×6 IMPLANT
PATTIES SURGICAL .5 X.5 (GAUZE/BANDAGES/DRESSINGS) ×2 IMPLANT
PATTIES SURGICAL .75X.75 (GAUZE/BANDAGES/DRESSINGS) ×1 IMPLANT
SEALANT ADHERUS EXTEND TIP (MISCELLANEOUS) ×1 IMPLANT
SPONGE T-LAP 4X18 ~~LOC~~+RFID (SPONGE) ×3 IMPLANT
STAPLER VISISTAT (STAPLE) ×1 IMPLANT
SUT NURALON 4 0 TR CR/8 (SUTURE) ×1 IMPLANT
SUT VIC AB 0 CT1 27 (SUTURE) ×2
SUT VIC AB 0 CT1 27XBRD ANBCTR (SUTURE) IMPLANT
SUT VIC AB 1 CTX 36 (SUTURE) ×2
SUT VIC AB 1 CTX36XBRD ANBCTR (SUTURE) ×2 IMPLANT
SUT VIC AB 2-0 CT1 27 (SUTURE) ×2
SUT VIC AB 2-0 CT1 TAPERPNT 27 (SUTURE) ×2 IMPLANT
SUT VIC AB 3-0 X1 27 (SUTURE) ×3 IMPLANT
TAPE CLOTH SURG 4X10 WHT LF (GAUZE/BANDAGES/DRESSINGS) ×1 IMPLANT
TOWEL GREEN STERILE (TOWEL DISPOSABLE) ×3 IMPLANT
TOWEL GREEN STERILE FF (TOWEL DISPOSABLE) ×3 IMPLANT
TRAY FOLEY MTR SLVR 16FR STAT (SET/KITS/TRAYS/PACK) ×1 IMPLANT

## 2021-11-26 NOTE — Op Note (Signed)
Preop diagnosis: Severe two-level lumbar spinal stenosis with neurogenic claudication L3-4, L4-5 with L3-4 disc extrusion. ? ?Postop diagnosis: Same. ? ?Procedure: L3-4, L4-5 decompression.  Near complete laminectomy L3, completed L4, complete laminectomy L5.  Repair of 3 mm Intra-Op durotomy with primary repair sutures.  Right L3-4 microdiscectomy removal of extruded fragment. ? ?Surgeon: Rodell Perna, MD ? ?Assistant: Benjiman Core, PA-C medically necessary and present for the entire procedure ? ?EBL 300 cc. ? ?Brief history 56 year old male with neurogenic claudication progressive and marked to severe stenosis with narrowing down to 5 mm centrally at L3-4 L4-5.  Patient had extruded central disc cephalad at the L3-4 level.  He had progressive symptoms with standing walking and difficulty working. ? ?Procedure after induction general esthesia prone positioning DuraPrep for the lumbar spine calf pump was back was prepped with DuraPrep preoperative Ancef was given.  Area squared with towels Betadine Steri-Drape applied timeout procedure completed laminectomy sheets and drapes. ? ?Midline incision was made subperiosteal dissection on the lamina.  Posterior elements were removed with combination of rondure thinning with the bur osteotomes.  Operative microscope was used for better visualization decompression.  There was hypertrophic ligamentum thick with overhanging spurs from the recess crowding the midline and narrowing down to 5 mm at the L3-4 level and 5 to 6 mm centrally at L4-5.  At L4-5 centrally 3 mm dural tear occurred with thinning of the lamina and neuromas were extruded through the dural tear carefully poked back in but would continue to extrude through the durotomy it was covered with a patty and complete laminectomy formed at L5 for additional room removing portions of the facet with osteotome with patties protect the dura.  Once decompression was completed 4-0 Nurolon was used for repair initial repair side  to side was unable to tuck the nerve root back in.  Had good suture out then recheck it in holding in with the [place] 4 while again the sutures were placed in this time the nerve root was able to be tucked and stayed inside the dura.  2 additional simple sutures were applied and tied with 4 kn completing the repair.  Covered with Patty and continued decompression until gutters were clean.  Some Surgiflo was used as well as bipolar cautery for epidural veins hemostasis.  At the L3-4 level single x-ray was taken confirming appropriate level microdiscectomy was performed with extruded disc on the right side cephalad removing portions with initially small micropituitary using the Epstein curette and straight pituitaries.  Once the dura was decompressed inspection L4-5 showed the disc was firm and even with the up sweep of the endplates.  Some DuraSeal was mixed and using the battery preparation which is available in the neurosurgery suite was draped over the repair area and allowed to set up.  Valsalva showed no leak.  Gutters were checked right left make sure there were no remaining spikes of bone or any remaining hypertrophic ligamentum causing compression dural tube was round copious irrigation reapproximation of the fascia with #1-0 Vicryl 2-0 Vicryl subtenons tissue skin staple closure postop dressing with Mepilex.  Patient was transferred to care room and was neurologically intact with intact sensation intact motor normal perianal sensation normal genital sensation.  The catheter has been placed since patient will stay flat till the morning due to the small dural tear and if no problems with a headache then he can be progressively mobilized with ambulation.  Patient tolerated the procedure well.  Intra-Op dural tear and repair was discussed  with the wife and also patient.  Patient noted immediate improvement in sensation to his legs.  All motor units are intact ankle dorsiflexion quads hamstrings hip abductors  and adductors and normal sensation.  His got needle count was correct.  Marcaine was infiltrated into the incision at the time of closure for postoperative analgesia. ?

## 2021-11-26 NOTE — Anesthesia Procedure Notes (Signed)
Procedure Name: Intubation ?Date/Time: 11/26/2021 12:58 PM ?Performed by: Myna Bright, CRNA ?Pre-anesthesia Checklist: Patient identified, Emergency Drugs available, Suction available and Patient being monitored ?Patient Re-evaluated:Patient Re-evaluated prior to induction ?Oxygen Delivery Method: Circle system utilized ?Preoxygenation: Pre-oxygenation with 100% oxygen ?Induction Type: IV induction ?Ventilation: Mask ventilation without difficulty ?Tube size: 7.5 mm ?Number of attempts: 1 ?Airway Equipment and Method: Video-laryngoscopy and Stylet ?Placement Confirmation: ETT inserted through vocal cords under direct vision, positive ETCO2 and breath sounds checked- equal and bilateral ?Secured at: 22 cm ?Tube secured with: Tape ?Dental Injury: Teeth and Oropharynx as per pre-operative assessment  ?Difficulty Due To: Difficulty was anticipated, Difficult Airway- due to limited oral opening, Difficult Airway- due to reduced neck mobility, Difficult Airway- due to large tongue and Difficult Airway-  due to edematous airway ?Future Recommendations: Recommend- induction with short-acting agent, and alternative techniques readily available ?Comments: Easy mask ventilation. DL with Glide, large amount of redundant tissue noted obscuring airway view. Partial view of cords obtained. ETT advanced without difficulty, +BBS, +EtCO2. Atraumatic intubation.  ? ? ? ? ?

## 2021-11-26 NOTE — H&P (Signed)
Brandon Burton is an 56 y.o. male.   Chief Complaint: Back pain and bilateral lower extremity radiculopathy, neurogenic claudication HPI: 56 year old black male history of L3-4 and L4-5 stenosis comes in for preop evaluation.  Patient continues to have ongoing low back pain, bilateral lower extremity radicular symptoms and neurogenic claudication.  He is want to proceed with L3-4 and L4-5 central decompression as scheduled.  Today history and physical performed.  Past Medical History:  Diagnosis Date   GERD (gastroesophageal reflux disease)    Hypertension     Past Surgical History:  Procedure Laterality Date   COLONOSCOPY N/A 07/22/2015   SLF:7 polyps removed/mild diverticulosis/moderate internal hemorrhoids   COLONOSCOPY N/A 11/18/2018   Procedure: COLONOSCOPY;  Surgeon: Danie Binder, MD;  Location: [facility];  Service: Endoscopy;  Laterality: N/A;  2:15pm   EYE SURGERY     SHOULDER SURGERY Left 09/28/2013   fix previous clavicular fracture complication    Family History  Problem Relation Age of Onset   Thyroid cancer Mother    Heart attack Father    Colon cancer Neg Hx    Colon polyps Neg Hx    Social History:  reports that he has been smoking cigars. He has never used smokeless tobacco. He reports that he does not currently use alcohol. He reports current drug use. Drug: Marijuana.  Allergies: No Known Allergies  Medications Prior to Admission  Medication Sig Dispense Refill   amLODipine (NORVASC) 10 MG tablet Take 10 mg by mouth daily.     diclofenac (VOLTAREN) 75 MG EC tablet TAKE 1 TABLET(75 MG) BY MOUTH TWICE DAILY 180 tablet 1   omeprazole (PRILOSEC) 40 MG capsule TAKE 1 CAPSULE(40 MG) BY MOUTH DAILY 90 capsule 3   gabapentin (NEURONTIN) 100 MG capsule Take 1 capsule (100 mg total) by mouth 3 (three) times daily. 90 capsule 2   methocarbamol (ROBAXIN) 500 MG tablet Take 1 tablet (500 mg total) by mouth 3 (three) times daily. (Patient not taking: Reported on  11/21/2021) 60 tablet 1    Results for orders placed or performed during the hospital encounter of 11/24/21 (from the past 48 hour(s))  Surgical PCR Screen     Status: None   Collection Time: 11/24/21  1:06 PM   Specimen: Nasal Mucosa; Nasal Swab  Result Value Ref Range   MRSA, PCR NEGATIVE NEGATIVE   Staphylococcus aureus NEGATIVE NEGATIVE    Comment: (NOTE) The Xpert SA Assay (FDA approved for NASAL specimens in patients 49 years of age and older), is one component of a comprehensive surveillance program. It is not intended to diagnose infection nor to guide or monitor treatment. Performed at [facility], [place] [address], [place], [place] [address]    No results found.  Review of Systems  Constitutional:  Positive for activity change.  HENT: Negative.    Respiratory: Negative.    Cardiovascular: Negative.   Gastrointestinal: Negative.   Genitourinary: Negative.   Musculoskeletal:  Positive for back pain and gait problem.  Neurological:  Positive for numbness.  Psychiatric/Behavioral: Negative.     Blood pressure (!) 164/92, pulse 94, temperature 97.8 F (36.6 C), temperature source Oral, resp. rate 18, height 5\' 8"  (1.727 m), weight 114.8 kg, SpO2 97 %. Physical Exam HENT:     Head: Normocephalic and atraumatic.     Nose: Nose normal.  Eyes:     Extraocular Movements: Extraocular movements intact.  Cardiovascular:     Rate and Rhythm: Regular rhythm.  Heart sounds: Normal heart sounds.  Pulmonary:     Effort: Pulmonary effort is normal. No respiratory distress.  Abdominal:     General: There is no distension.  Musculoskeletal:        General: Tenderness present.  Neurological:     Mental Status: He is alert and oriented to person, place, and time.  Psychiatric:        Mood and Affect: Mood normal.     Assessment/Plan L3-4 and L4-5 stenosis   We will proceed with L3-4 and L4-5 central decompression as scheduled.  Surgical procedure discussed with  patient in great detail along with potential rehab/recovery time.  All questions answered.  Benjiman Core, PA-C 11/26/2021, 12:23 PM

## 2021-11-26 NOTE — Interval H&P Note (Signed)
History and Physical Interval Note: ? ?11/26/2021 ?12:34 PM ? ?Brandon Burton  has presented today for surgery, with the diagnosis of L3-4, L4-5 stenosis.  The various methods of treatment have been discussed with the patient and family. After consideration of risks, benefits and other options for treatment, the patient has consented to  Procedure(s): ?L3-4, L4-5 CENTRAL DECOMPRESSION (N/A) as a surgical intervention.  The patient's history has been reviewed, patient examined, no change in status, stable for surgery.  I have reviewed the patient's chart and labs.  Questions were answered to the patient's satisfaction.   ? ? ?[name] ? ? ?

## 2021-11-26 NOTE — Anesthesia Postprocedure Evaluation (Signed)
Anesthesia Post Note ? ?Patient: Brandon Burton ? ?Procedure(s) Performed: Lumbar three-four, Lumbar four-five Central Decompression, Lumbar Three-Four Microdiscectomhy ? ?  ? ?Patient location during evaluation: PACU ?Anesthesia Type: General ?Level of consciousness: awake and alert, patient cooperative and oriented ?Pain management: pain level controlled (pain improving) ?Vital Signs Assessment: post-procedure vital signs reviewed and stable ?Respiratory status: spontaneous breathing, nonlabored ventilation and respiratory function stable ?Cardiovascular status: blood pressure returned to baseline and stable ?Postop Assessment: no apparent nausea or vomiting ?Anesthetic complications: no ? ? ?No notable events documented. ? ?Last Vitals:  ?Vitals:  ? 11/26/21 1738 11/26/21 1755  ?BP: (!) 157/93 (!) 142/79  ?Pulse: 71 67  ?Resp: 15 15  ?Temp:  36.7 ?C  ?SpO2: 96% 93%  ?  ?Last Pain:  ?Vitals:  ? 11/26/21 1723  ?TempSrc:   ?PainSc: 10-Worst pain ever  ? ? ?  ?  ?  ?  ?  ?  ? ?Carrianne Hyun,E. Mykeria Garman ? ? ? ? ?

## 2021-11-26 NOTE — Interval H&P Note (Signed)
History and Physical Interval Note: ? ?11/26/2021 ?12:33 PM ? ?Brandon Burton  has presented today for surgery, with the diagnosis of L3-4, L4-5 stenosis.  The various methods of treatment have been discussed with the patient and family. After consideration of risks, benefits and other options for treatment, the patient has consented to  Procedure(s): ?L3-4, L4-5 CENTRAL DECOMPRESSION (N/A) as a surgical intervention.  The patient's history has been reviewed, patient examined, no change in status, stable for surgery.  I have reviewed the patient's chart and labs.  Questions were answered to the patient's satisfaction.   ? ? ?[name] ? ? ?

## 2021-11-26 NOTE — Progress Notes (Signed)
Patient ID: Brandon Burton, male   DOB: 1966/01/14, 56 y.o.   MRN: [id number] ?Postop exam demonstrates intact sensory lower extremities anterior tib gastrocsoleus quads hamstrings are intact normal sensation lower extremities.  Perianal gentle sensation normal.  Foley catheter in place since he is flat at bedrest with his 3 mm dural tear that was repaired primarily.  Patient will be flat at bedrest and I will reassess him in the morning. ?

## 2021-11-26 NOTE — Anesthesia Preprocedure Evaluation (Addendum)
Anesthesia Evaluation  ?Patient identified by MRN, date of birth, ID band ?Patient awake ? ? ? ?Reviewed: ?Allergy & Precautions, NPO status , Patient's Chart, lab work & pertinent test results ? ?Airway ?Mallampati: III ? ?TM Distance: >3 FB ?Neck ROM: Full ? ? ? Dental ? ?(+) Teeth Intact, Dental Advisory Given,  ?  ?Pulmonary ?Current Smoker,  ?Snores at night, has never had a sleep study  ?  ?Pulmonary exam normal ?breath sounds clear to auscultation ? ? ? ? ? ? Cardiovascular ?hypertension (164/92 in preop, per pt normally in 120s SBPs), Pt. on medications ?Normal cardiovascular exam ?Rhythm:Regular Rate:Normal ? ? ?  ?Neuro/Psych ?negative neurological ROS ? negative psych ROS  ? GI/Hepatic ?GERD  Medicated and Controlled,(+)  ?  ?  ? marijuana use,   ?Endo/Other  ?Obesity BMI 38 ? Renal/GU ?negative Renal ROS  ?negative genitourinary ?  ?Musculoskeletal ?negative musculoskeletal ROS ?(+)  ? Abdominal ?(+) + obese,   ?Peds ? Hematology ?negative hematology ROS ?(+)   ?Anesthesia Other Findings ? ? Reproductive/Obstetrics ?negative OB ROS ? ?  ? ? ? ? ? ? ? ? ? ? ? ? ? ?  ?  ? ? ? ? ? ? ? ? ?Anesthesia Physical ?Anesthesia Plan ? ?ASA: 3 ? ?Anesthesia Plan: General  ? ?Post-op Pain Management: Ofirmev IV (intra-op)* and Toradol IV (intra-op)*  ? ?Induction: Intravenous ? ?PONV Risk Score and Plan: 1 and Ondansetron, Dexamethasone, Midazolam and Treatment may vary due to age or medical condition ? ?Airway Management Planned: Oral ETT and Video Laryngoscope Planned ? ?Additional Equipment: None ? ?Intra-op Plan:  ? ?Post-operative Plan: Extubation in OR ? ?Informed Consent: I have reviewed the patients History and Physical, chart, labs and discussed the procedure including the risks, benefits and alternatives for the proposed anesthesia with the patient or authorized representative who has indicated his/her understanding and acceptance.  ? ? ? ?Dental advisory given ? ?Plan  Discussed with: CRNA ? ?Anesthesia Plan Comments:   ? ? ? ? ? ? ?Anesthesia Quick Evaluation ? ?

## 2021-11-26 NOTE — Transfer of Care (Signed)
Immediate Anesthesia Transfer of Care Note ? ?Patient: Brandon Burton ? ?Procedure(s) Performed: Lumbar three-four, Lumbar four-five Central Decompression, Lumbar Three-Four Microdiscectomhy ? ?Patient Location: PACU ? ?Anesthesia Type:General ? ?Level of Consciousness: awake, alert , oriented and patient cooperative ? ?Airway & Oxygen Therapy: Patient Spontanous Breathing and Patient connected to face mask oxygen ? ?Post-op Assessment: Report given to RN, Post -op Vital signs reviewed and stable and Patient moving all extremities ? ?Post vital signs: Reviewed and stable ? ?Last Vitals:  ?Vitals Value Taken Time  ?BP 125/78 11/26/21 1652  ?Temp 36.1 ?C 11/26/21 1650  ?Pulse 81 11/26/21 1658  ?Resp 21 11/26/21 1658  ?SpO2 97 % 11/26/21 1658  ?Vitals shown include unvalidated device data. ? ?Last Pain:  ?Vitals:  ? 11/26/21 1650  ?TempSrc:   ?PainSc: 10-Worst pain ever  ?   ? ?Patients Stated Pain Goal: 3 (11/26/21 1650) ? ?Complications: No notable events documented. ?

## 2021-11-27 ENCOUNTER — Encounter (HOSPITAL_COMMUNITY): Payer: Self-pay | Admitting: Orthopaedic Surgery

## 2021-11-27 DIAGNOSIS — M48062 Spinal stenosis, lumbar region with neurogenic claudication: Secondary | ICD-10-CM | POA: Diagnosis not present

## 2021-11-27 DIAGNOSIS — M5116 Intervertebral disc disorders with radiculopathy, lumbar region: Secondary | ICD-10-CM | POA: Diagnosis not present

## 2021-11-27 DIAGNOSIS — Z79899 Other long term (current) drug therapy: Secondary | ICD-10-CM | POA: Diagnosis not present

## 2021-11-27 DIAGNOSIS — F17290 Nicotine dependence, other tobacco product, uncomplicated: Secondary | ICD-10-CM | POA: Diagnosis not present

## 2021-11-27 DIAGNOSIS — I10 Essential (primary) hypertension: Secondary | ICD-10-CM | POA: Diagnosis not present

## 2021-11-27 MED ORDER — METHOCARBAMOL 500 MG PO TABS: 500.0000 mg | ORAL_TABLET | Freq: Three times a day (TID) | ORAL | 0 refills | Status: DC | PRN

## 2021-11-27 MED ORDER — OXYCODONE-ACETAMINOPHEN 5-325 MG PO TABS: 1.0000 | ORAL_TABLET | ORAL | 0 refills | Status: DC | PRN

## 2021-11-27 MED FILL — Thrombin For Soln 20000 Unit: CUTANEOUS | Qty: 0.25 | Status: CN

## 2021-11-27 MED FILL — Thrombin For Soln 5000 Unit: CUTANEOUS | Qty: 5000 | Status: AC

## 2021-11-27 NOTE — Evaluation (Signed)
Occupational Therapy Evaluation ?Patient Details ?Name: Brandon Burton ?MRN: [id number] ?DOB: Jan 28, 1966 ?Today's Date: 11/27/2021 ? ? ?History of Present Illness 56 y/o male admitted on 11/26/21 following L3-5 central decompression. PMH: HTN  ? ?Clinical Impression ?  ?Patient admitted for the procedure above.  He is experiencing post op discomfort, but has the needed assist at home.  Hip kit demonstrated during ADL.  He is needing up to [place] for lower body ADL from a sit/stand level.  Back precautions reviewed, and family education performed.  All questions answered, and no further OT needs exist in the acute setting.     ?   ? ?Recommendations for follow up therapy are one component of a multi-disciplinary discharge planning process, led by the attending physician.  Recommendations may be updated based on patient status, additional functional criteria and insurance authorization.  ? ?Follow Up Recommendations ? No OT follow up  ?  ?Assistance Recommended at Discharge  Intermittent Min A  ?Patient can return home with the following   ? ?  ?Functional Status Assessment ? Patient has had a recent decline in their functional status and demonstrates the ability to make significant improvements in function in a reasonable and predictable amount of time.  ?Equipment Recommendations ? None recommended by OT  ?  ?Recommendations for Other Services   ? ? ?  ?Precautions / Restrictions Precautions ?Precautions: Back ?Precaution Booklet Issued: Yes (comment) ?Required Braces or Orthoses:  (no brace needed) ?Restrictions ?Weight Bearing Restrictions: No  ? ?  ? ?Mobility Bed Mobility ?Overal bed mobility: Needs Assistance ?  ?  ?  ?  ?  ?  ?General bed mobility comments: up in recliner ?  ? ?Transfers ?Overall transfer level: Needs assistance ?Equipment used: Rolling walker (2 wheels) ?Transfers: Sit to/from Stand ?Sit to Stand: Min assist ?  ?  ?  ?  ?  ?  ?  ? ?  ?Balance Overall balance assessment: Needs  assistance ?Sitting-balance support: No upper extremity supported, Feet supported ?Sitting balance-Leahy Scale: Fair ?  ?  ?Standing balance support: Bilateral upper extremity supported, Reliant on assistive device for balance ?Standing balance-Leahy Scale: Poor ?Standing balance comment: reliant on UE support ?  ?  ?  ?  ?  ?  ?  ?  ?  ?  ?  ?   ? ?ADL either performed or assessed with clinical judgement  ? ?ADL   ?  ?  ?  ?  ?  ?  ?  ?  ?  ?  ?Lower Body Dressing: Minimal assistance;Sit to/from stand;With adaptive equipment ?  ?  ?  ?  ?  ?  ?  ?Functional mobility during ADLs: Supervision/safety;Rolling walker (2 wheels) ?   ? ? ? ?Vision Patient Visual Report: No change from baseline ?   ?   ?Perception Perception ?Perception: Not tested ?  ?Praxis Praxis ?Praxis: Not tested ?  ? ?Pertinent Vitals/Pain Pain Assessment ?Pain Assessment: Faces ?Faces Pain Scale: Hurts even more ?Pain Location: back ?Pain Descriptors / Indicators: Operative site guarding ?Pain Intervention(s): Monitored during session  ? ? ? ?Hand Dominance Right ?  ?Extremity/Trunk Assessment Upper Extremity Assessment ?Upper Extremity Assessment: Overall WFL for tasks assessed ?  ?Lower Extremity Assessment ?Lower Extremity Assessment: Defer to PT evaluation ?  ?Cervical / Trunk Assessment ?Cervical / Trunk Assessment: Back Surgery ?  ?Communication Communication ?Communication: No difficulties ?  ?Cognition Arousal/Alertness: Awake/alert ?Behavior During Therapy: [facility] for tasks assessed/performed ?Overall Cognitive Status: Within Functional Limits  for tasks assessed ?  ?  ?  ?  ?  ?  ?  ?  ?  ?  ?  ?  ?  ?  ?  ?  ?  ?  ?  ?General Comments   VSS on RA ? ?  ?Exercises   ?  ?Shoulder Instructions    ? ? ?Home Living Family/patient expects to be discharged to:: Private residence ?Living Arrangements: Spouse/significant other;Children ?Available Help at Discharge: Family ?Type of Home: House ?Home Access: Stairs to enter ?Entrance Stairs-Number of  Steps: 2 ?Entrance Stairs-Rails: Right;Left;Can reach both ?Home Layout: One level ?  ?  ?Bathroom Shower/Tub: Walk-in shower ?  ?  ?Bathroom Accessibility: Yes ?How Accessible: Accessible via walker ?Home Equipment: None ?  ?  ?  ? ?  ?Prior Functioning/Environment Prior Level of Function : Independent/Modified Independent;Driving ?  ?  ?  ?  ?  ?  ?  ?  ?  ? ?  ?  ?OT Problem List: Pain;Impaired balance (sitting and/or standing) ?  ?   ?OT Treatment/Interventions:    ?  ?OT Goals(Current goals can be found in the care plan section) Acute Rehab OT Goals ?Patient Stated Goal: patient is discharging home ?OT Goal Formulation: With patient ?Time For Goal Achievement: 11/28/21 ?Potential to Achieve Goals: Good  ?OT Frequency:   ?  ? ?Co-evaluation   ?  ?  ?  ?  ? ?  ?AM-PAC OT "6 Clicks" Daily Activity     ?Outcome Measure Help from another person eating meals?: None ?Help from another person taking care of personal grooming?: None ?Help from another person toileting, which includes using toliet, bedpan, or urinal?: A Little ?Help from another person bathing (including washing, rinsing, drying)?: A Little ?Help from another person to put on and taking off regular upper body clothing?: None ?Help from another person to put on and taking off regular lower body clothing?: A Little ?6 Click Score: 21 ?  ?End of Session Equipment Utilized During Treatment: Rolling walker (2 wheels) ?Nurse Communication: Mobility status ? ?Activity Tolerance: Patient tolerated treatment well ?Patient left: in chair;with call bell/phone within reach;with family/visitor present ? ?OT Visit Diagnosis: Unsteadiness on feet (R26.81)  ?              ?Time: 2035-5974 ?OT Time Calculation (min): 18 min ?Charges:  OT General Charges ?$OT Visit: 1 Visit ?OT Evaluation ?$OT Eval Moderate Complexity: 1 Mod ? ?11/27/2021 ? ?RP, OTR/L ? ?Acute Rehabilitation Services ? ?Office:  [phone] ? ? ?Richard D Popella ?11/27/2021, 5:11 PM ?

## 2021-11-27 NOTE — Progress Notes (Signed)
Patient ID: Brandon Burton, male   DOB: 10-09-1965, 56 y.o.   MRN: [id number] ?Patient mobilized with RN to chair.  Physical therapy order placed.  Ambulation with therapy and if he is mobile can discharge him this afternoon.  No headache . ?

## 2021-11-27 NOTE — Evaluation (Signed)
Physical Therapy Evaluation ?Patient Details ?Name: Brandon Burton ?MRN: [id number] ?DOB: 12-15-1965 ?Today's Date: 11/27/2021 ? ?History of Present Illness ? 56 y/o male admitted on 11/26/21 following L3-5 central decompression. PMH: HTN  ?Clinical Impression ? Patient admitted following above procedure. Patient presents with generalized weakness, decreased activity tolerance, and pain. Educated patient on back precautions and progressive walking program, patient verbalized understanding. Patient required min guard for bed mobility and minA for sit to stand transfer. Patient ambulated in hallway and negotiated 2 stairs with supervision and RW. Patient will benefit from skilled PT services during acute stay to address listed deficits. No PT follow up recommended at this time.  ?   ? ?Recommendations for follow up therapy are one component of a multi-disciplinary discharge planning process, led by the attending physician.  Recommendations may be updated based on patient status, additional functional criteria and insurance authorization. ? ?Follow Up Recommendations No PT follow up ? ?  ?Assistance Recommended at Discharge Intermittent Supervision/Assistance  ?Patient can return home with the following ?   ? ?  ?Equipment Recommendations Rolling Melah Ebling (2 wheels)  ?Recommendations for Other Services ?    ?  ?Functional Status Assessment Patient has had a recent decline in their functional status and demonstrates the ability to make significant improvements in function in a reasonable and predictable amount of time.  ? ?  ?Precautions / Restrictions Precautions ?Precautions: Back ?Precaution Booklet Issued: Yes (comment) ?Required Braces or Orthoses:  (no brace needed) ?Restrictions ?Weight Bearing Restrictions: No  ? ?  ? ?Mobility ? Bed Mobility ?Overal bed mobility: Needs Assistance ?Bed Mobility: Rolling, Sidelying to Sit ?Rolling: Min guard ?Sidelying to sit: Min guard ?  ?  ?  ?General bed mobility comments: min guard  for safety ?  ? ?Transfers ?Overall transfer level: Needs assistance ?Equipment used: Rolling Starkeisha Vanwinkle (2 wheels) ?Transfers: Sit to/from Stand ?Sit to Stand: Min assist ?  ?  ?  ?  ?  ?General transfer comment: minA to rise from low bed surface, cues for hand placement ?  ? ?Ambulation/Gait ?Ambulation/Gait assistance: Supervision ?Gait Distance (Feet): 250 Feet ?Assistive device: Rolling Jordon Bourquin (2 wheels) ?Gait Pattern/deviations: Step-through pattern, Decreased stride length ?Gait velocity: decreased ?  ?  ?General Gait Details: supervision for safety but no physical assist needed ? ?Stairs ?Stairs: Yes ?Stairs assistance: Supervision ?Stair Management: Two rails, Step to pattern, Forwards ?Number of Stairs: 2 ?General stair comments: supervision for safety, heavy reliance on UE suppor ? ?Wheelchair Mobility ?  ? ?Modified Rankin (Stroke Patients Only) ?  ? ?  ? ?Balance Overall balance assessment: Needs assistance ?Sitting-balance support: No upper extremity supported, Feet supported ?Sitting balance-Leahy Scale: Fair ?  ?  ?Standing balance support: Bilateral upper extremity supported, Reliant on assistive device for balance ?Standing balance-Leahy Scale: Poor ?Standing balance comment: reliant on UE support ?  ?  ?  ?  ?  ?  ?  ?  ?  ?  ?  ?   ? ? ? ?Pertinent Vitals/Pain Pain Assessment ?Pain Assessment: 0-10 ?Pain Score: 8  ?Pain Location: back ?Pain Descriptors / Indicators: Operative site guarding ?Pain Intervention(s): Limited activity within patient's tolerance, Monitored during session  ? ? ?Home Living Family/patient expects to be discharged to:: Private residence ?Living Arrangements: Spouse/significant other;Children ?Available Help at Discharge: Family ?Type of Home: House ?Home Access: Stairs to enter ?Entrance Stairs-Rails: Right;Left;Can reach both ?Entrance Stairs-Number of Steps: 2 ?  ?Home Layout: One level ?Home Equipment: None ?   ?  ?Prior  Function Prior Level of Function :  Independent/Modified Independent;Driving ?  ?  ?  ?  ?  ?  ?  ?  ?  ? ? ?Hand Dominance  ?   ? ?  ?Extremity/Trunk Assessment  ? Upper Extremity Assessment ?Upper Extremity Assessment: Defer to OT evaluation ?  ? ?Lower Extremity Assessment ?Lower Extremity Assessment: Generalized weakness ?  ? ?Cervical / Trunk Assessment ?Cervical / Trunk Assessment: Back Surgery  ?Communication  ? Communication: No difficulties  ?Cognition Arousal/Alertness: Awake/alert ?Behavior During Therapy: [facility] for tasks assessed/performed ?Overall Cognitive Status: Within Functional Limits for tasks assessed ?  ?  ?  ?  ?  ?  ?  ?  ?  ?  ?  ?  ?  ?  ?  ?  ?  ?  ?  ? ?  ?General Comments   ? ?  ?Exercises    ? ?Assessment/Plan  ?  ?PT Assessment Patient needs continued PT services  ?PT Problem List Decreased strength;Decreased activity tolerance;Decreased balance;Decreased mobility;Decreased knowledge of precautions ? ?   ?  ?PT Treatment Interventions DME instruction;Stair training;Gait training;Functional mobility training;Therapeutic activities;Therapeutic exercise;Balance training;Patient/family education   ? ?PT Goals (Current goals can be found in the Care Plan section)  ?Acute Rehab PT Goals ?Patient Stated Goal: to go home ?PT Goal Formulation: With patient ?Time For Goal Achievement: 12/11/21 ?Potential to Achieve Goals: Good ? ?  ?Frequency Min 5X/week ?  ? ? ?Co-evaluation   ?  ?  ?  ?  ? ? ?  ?AM-PAC PT "6 Clicks" Mobility  ?Outcome Measure Help needed turning from your back to your side while in a flat bed without using bedrails?: A Little ?Help needed moving from lying on your back to sitting on the side of a flat bed without using bedrails?: A Little ?Help needed moving to and from a bed to a chair (including a wheelchair)?: A Little ?Help needed standing up from a chair using your arms (e.g., wheelchair or bedside chair)?: A Little ?Help needed to walk in hospital room?: A Little ?Help needed climbing 3-5 steps with a  railing? : A Little ?6 Click Score: 18 ? ?  ?End of Session   ?Activity Tolerance: Patient tolerated treatment well ?Patient left: in chair;with call bell/phone within reach;with chair alarm set ?Nurse Communication: Mobility status ?PT Visit Diagnosis: Unsteadiness on feet (R26.81);Muscle weakness (generalized) (M62.81) ?  ? ?Time: 2706-2376 ?PT Time Calculation (min) (ACUTE ONLY): 24 min ? ? ?Charges:   PT Evaluation ?$PT Eval Moderate Complexity: 1 Mod ?PT Treatments ?$Gait Training: 8-22 mins ?  ?   ? ? ?Lindley Hiney A. Gilford Rile, PT, DPT ?Acute Rehabilitation Services ?Pager [phone] ?Office [phone] ? ? ?Marymargaret Kirker A Sanda Dejoy ?11/27/2021, 4:20 PM ? ?

## 2021-11-27 NOTE — Discharge Instructions (Signed)
Avoid bending turning twisting.  Change dressing as needed.  You can take a shower with the dressing off.  You can do some walking each day.  See Dr. Lorin Mercy in 2 weeks. ?

## 2021-11-27 NOTE — Progress Notes (Signed)
This RN notified in report that pt had saturated through dressing and new mepilex foam was applied, this RN reapplied a new one on initial assessment due to dressing becoming saturated  ? ?This RN reassessed the dressing, pt soaked through the mepilex and on the bed pad, reinforced the dressing with ABD pads and paper tape changed bed pad ?

## 2021-11-27 NOTE — Progress Notes (Addendum)
Patient ID: Brandon Burton, male   DOB: 02-17-1966, 56 y.o.   MRN: [id number] ? ? ?Subjective: ?1 Day Post-Op Procedure(s) (LRB): ?Lumbar three-four, Lumbar four-five Central Decompression, Lumbar Three-Four Microdiscectomhy (N/A) ?Patient reports pain as mild and moderate.  Incisional. ? ?Objective: ?Vital signs in last 24 hours: ?Temp:  [97 ?F (36.1 ?C)-99.8 ?F (37.7 ?C)] 99.3 ?F (37.4 ?C) (03/02 0745) ?Pulse Rate:  [63-94] 91 (03/02 0745) ?Resp:  [11-18] 16 (03/02 0745) ?BP: (125-164)/(65-93) 149/83 (03/02 0745) ?SpO2:  [93 %-99 %] 98 % (03/02 0745) ?Weight:  [114.8 kg] 114.8 kg (03/01 1039) ? ?Intake/Output from previous day: ?03/01 0701 - 03/02 0700 ?In: 2381.2 [P.O.:50; I.V.:2331.2] ?Out: 2080 [Urine:1780; Blood:300] ?Intake/Output this shift: ?No intake/output data recorded. ? ?Recent Labs  ?  11/24/21 ?1125  ?HGB 13.7  ? ?Recent Labs  ?  11/24/21 ?1125  ?WBC 8.1  ?RBC 4.85  ?HCT 42.6  ?PLT 492*  ? ?Recent Labs  ?  11/24/21 ?1125  ?NA 141  ?K 3.7  ?CL 108  ?CO2 25  ?BUN 11  ?CREATININE 1.17  ?GLUCOSE 104*  ?CALCIUM 8.6*  ? ?No results for input(s): LABPT, INR in the last 72 hours. ? ?Neurologically intact bloody drainage on dressing changed.  ?DG Lumbar Spine 2-3 Views ? ?Result Date: 11/26/2021 ?CLINICAL DATA:  Cross-table lateral view of lumbar spine for intraoperative localization EXAM: LUMBAR SPINE - 2-3 VIEW COMPARISON:  None. FINDINGS: Portable cross-table lateral views of lumbar spine were done for intraoperative localization. IMPRESSION: Portable lateral views of lumbar spine with done for intraoperative localization. Electronically Signed   By: Elmer Picker M.D.   On: 11/26/2021 16:16   ? ?Assessment/Plan: ?1 Day Post-Op Procedure(s) (LRB): ?Lumbar three-four, Lumbar four-five Central Decompression, Lumbar Three-Four Microdiscectomhy (N/A) ?Plan:  begin to progress to upright position this AM. If sitting and no headache will start PT ambulation. Changed dressing with [place] RN.   My cell  [phone].       ? ?[name] ?11/27/2021, 7:51 AM ? ? ? ? ? ?

## 2021-11-27 NOTE — Progress Notes (Signed)
Patient ID: Brandon Burton, male   DOB: [birth year], 56 y.o.   MRN: [id number] ?Patient ambulating with no headache no back pain.  Good strength in his legs walk up and down the halls and is acting.  He states his legs feel good.  He has voided.  Discharge home no bending turning twisting office follow-up 2 weeks with me. ?

## 2021-12-09 ENCOUNTER — Other Ambulatory Visit: Payer: Self-pay

## 2021-12-09 ENCOUNTER — Ambulatory Visit (INDEPENDENT_AMBULATORY_CARE_PROVIDER_SITE_OTHER): Payer: BC Managed Care – PPO | Admitting: Orthopaedic Surgery

## 2021-12-09 ENCOUNTER — Inpatient Hospital Stay (HOSPITAL_COMMUNITY)
Admission: AD | Admit: 2021-12-09 | Discharge: 2021-12-17 | DRG: 857 | Disposition: A | Discharge status: Home / Self Care | Payer: BC Managed Care – PPO | Source: Ambulatory Visit | Attending: Orthopaedic Surgery | Admitting: Orthopaedic Surgery

## 2021-12-09 VITALS — BP 142/85 | HR 97 | Temp 97.0°F | Ht 68.0 in | Wt 253.0 lb

## 2021-12-09 DIAGNOSIS — I739 Peripheral vascular disease, unspecified: Secondary | ICD-10-CM | POA: Diagnosis not present

## 2021-12-09 DIAGNOSIS — F17290 Nicotine dependence, other tobacco product, uncomplicated: Secondary | ICD-10-CM | POA: Diagnosis present

## 2021-12-09 DIAGNOSIS — I82409 Acute embolism and thrombosis of unspecified deep veins of unspecified lower extremity: Secondary | ICD-10-CM | POA: Clinically undetermined

## 2021-12-09 DIAGNOSIS — G9600 Cerebrospinal fluid leak, unspecified: Principal | ICD-10-CM | POA: Diagnosis present

## 2021-12-09 DIAGNOSIS — L24A9 Irritant contact dermatitis due friction or contact with other specified body fluids: Secondary | ICD-10-CM | POA: Diagnosis present

## 2021-12-09 DIAGNOSIS — Y838 Other surgical procedures as the cause of abnormal reaction of the patient, or of later complication, without mention of misadventure at the time of the procedure: Secondary | ICD-10-CM | POA: Diagnosis present

## 2021-12-09 DIAGNOSIS — K219 Gastro-esophageal reflux disease without esophagitis: Secondary | ICD-10-CM | POA: Diagnosis not present

## 2021-12-09 DIAGNOSIS — Z8249 Family history of ischemic heart disease and other diseases of the circulatory system: Secondary | ICD-10-CM

## 2021-12-09 DIAGNOSIS — Z79899 Other long term (current) drug therapy: Secondary | ICD-10-CM | POA: Diagnosis not present

## 2021-12-09 DIAGNOSIS — R609 Edema, unspecified: Secondary | ICD-10-CM | POA: Diagnosis not present

## 2021-12-09 DIAGNOSIS — E8809 Other disorders of plasma-protein metabolism, not elsewhere classified: Secondary | ICD-10-CM | POA: Diagnosis not present

## 2021-12-09 DIAGNOSIS — G9782 Other postprocedural complications and disorders of nervous system: Secondary | ICD-10-CM

## 2021-12-09 DIAGNOSIS — Z9889 Other specified postprocedural states: Secondary | ICD-10-CM

## 2021-12-09 DIAGNOSIS — T8141XA Infection following a procedure, superficial incisional surgical site, initial encounter: Secondary | ICD-10-CM | POA: Diagnosis not present

## 2021-12-09 DIAGNOSIS — I82411 Acute embolism and thrombosis of right femoral vein: Secondary | ICD-10-CM | POA: Diagnosis not present

## 2021-12-09 DIAGNOSIS — T148XXA Other injury of unspecified body region, initial encounter: Secondary | ICD-10-CM | POA: Diagnosis present

## 2021-12-09 DIAGNOSIS — I10 Essential (primary) hypertension: Secondary | ICD-10-CM | POA: Diagnosis present

## 2021-12-09 DIAGNOSIS — G9609 Other spinal cerebrospinal fluid leak: Secondary | ICD-10-CM | POA: Diagnosis not present

## 2021-12-09 DIAGNOSIS — Y929 Unspecified place or not applicable: Secondary | ICD-10-CM

## 2021-12-09 DIAGNOSIS — Z20822 Contact with and (suspected) exposure to covid-19: Secondary | ICD-10-CM | POA: Diagnosis not present

## 2021-12-09 LAB — CBC WITH DIFFERENTIAL/PLATELET
Abs Immature Granulocytes: 0.1 10*3/uL — ABNORMAL HIGH (ref 0.00–0.07)
Basophils Absolute: 0 10*3/uL (ref 0.0–0.1)
Basophils Relative: 0 %
Eosinophils Absolute: 0 10*3/uL (ref 0.0–0.5)
Eosinophils Relative: 0 %
HCT: 35.1 % — ABNORMAL LOW (ref 39.0–52.0)
Hemoglobin: 11.5 g/dL — ABNORMAL LOW (ref 13.0–17.0)
Immature Granulocytes: 1 %
Lymphocytes Relative: 10 %
Lymphs Abs: 1.8 10*3/uL (ref 0.7–4.0)
MCH: 28.8 pg (ref 26.0–34.0)
MCHC: 32.8 g/dL (ref 30.0–36.0)
MCV: 88 fL (ref 80.0–100.0)
Monocytes Absolute: 1.3 10*3/uL — ABNORMAL HIGH (ref 0.1–1.0)
Monocytes Relative: 8 %
Neutro Abs: 14.3 10*3/uL — ABNORMAL HIGH (ref 1.7–7.7)
Neutrophils Relative %: 81 %
Platelets: 643 10*3/uL — ABNORMAL HIGH (ref 150–400)
RBC: 3.99 MIL/uL — ABNORMAL LOW (ref 4.22–5.81)
RDW: 16 % — ABNORMAL HIGH (ref 11.5–15.5)
WBC: 17.5 10*3/uL — ABNORMAL HIGH (ref 4.0–10.5)
nRBC: 0 % (ref 0.0–0.2)

## 2021-12-09 LAB — COMPREHENSIVE METABOLIC PANEL
ALT: 31 U/L (ref 0–44)
AST: 29 U/L (ref 15–41)
Albumin: 3 g/dL — ABNORMAL LOW (ref 3.5–5.0)
Alkaline Phosphatase: 86 U/L (ref 38–126)
Anion gap: 9 (ref 5–15)
BUN: 13 mg/dL (ref 6–20)
CO2: 25 mmol/L (ref 22–32)
Calcium: 8.9 mg/dL (ref 8.9–10.3)
Chloride: 103 mmol/L (ref 98–111)
Creatinine, Ser: 1.1 mg/dL (ref 0.61–1.24)
GFR, Estimated: >60 mL/min (ref >60)
Glucose, Bld: 120 mg/dL — ABNORMAL HIGH (ref 70–99)
Potassium: 3.6 mmol/L (ref 3.5–5.1)
Sodium: 137 mmol/L (ref 135–145)
Total Bilirubin: 0.6 mg/dL (ref 0.3–1.2)
Total Protein: 7.4 g/dL (ref 6.5–8.1)

## 2021-12-09 LAB — SURGICAL PCR SCREEN
MRSA, PCR: NEGATIVE
Staphylococcus aureus: NEGATIVE

## 2021-12-09 LAB — SEDIMENTATION RATE: Sed Rate: 86 mm/hr — ABNORMAL HIGH (ref 0–16)

## 2021-12-09 LAB — C-REACTIVE PROTEIN: CRP: 16.9 mg/dL — ABNORMAL HIGH (ref <1.0)

## 2021-12-09 MED ORDER — CEFAZOLIN SODIUM-DEXTROSE 2-4 GM/100ML-% IV SOLN
2.0000 g | INTRAVENOUS | Status: AC
  Administered 2021-12-10: 2 g via INTRAVENOUS
  Filled 2021-12-09: qty 100

## 2021-12-09 MED ORDER — OXYCODONE-ACETAMINOPHEN 5-325 MG PO TABS
1.0000 | ORAL_TABLET | Freq: Four times a day (QID) | ORAL | Status: DC | PRN
  Administered 2021-12-09 – 2021-12-17 (×12): 2 via ORAL
  Filled 2021-12-09 (×12): qty 2

## 2021-12-09 MED ORDER — CHLORHEXIDINE GLUCONATE 4 % EX LIQD
60.0000 mL | Freq: Once | CUTANEOUS | Status: DC
  Filled 2021-12-09: qty 60

## 2021-12-09 MED ORDER — CEFAZOLIN SODIUM-DEXTROSE 2-4 GM/100ML-% IV SOLN
2.0000 g | Freq: Three times a day (TID) | INTRAVENOUS | Status: DC
  Administered 2021-12-09 – 2021-12-12 (×8): 2 g via INTRAVENOUS
  Filled 2021-12-09 (×9): qty 100

## 2021-12-09 MED ORDER — POVIDONE-IODINE 10 % EX SWAB: 2.0000 "application " | Freq: Once | CUTANEOUS | Status: DC

## 2021-12-09 MED ORDER — MUPIROCIN 2 % EX OINT
1.0000 "application " | TOPICAL_OINTMENT | Freq: Two times a day (BID) | CUTANEOUS | Status: AC
  Administered 2021-12-09 – 2021-12-13 (×9): 1 via NASAL
  Filled 2021-12-09 (×3): qty 22

## 2021-12-09 NOTE — Progress Notes (Addendum)
? ?Office Visit Note ?  ?Patient: Brandon Burton           ?Date of Birth: 02/28/1966           ?MRN: [id number] ?Visit Date: 12/09/2021 ?             ?Requested by: Asencion Noble, MD ?[address] ?[place],  [place] [address] ?PCP: Asencion Noble, MD ? ? ?Assessment & Plan: ?Visit Diagnoses:  ?1. Status post lumbar spine surgery for decompression of spinal cord   ?2. Postoperative CSF leak   ? ? ?Plan: Will admit patient to hospital bedrest condom catheter he will be flat in bed.  Posted for surgery tomorrow afternoon.  Exploration dural repair as needed.  Possible CSF drain placement.  Discussed with patient and wife plan procedure and we will admitted to the hospital at this time. ? ?Follow-Up Instructions: No follow-ups on file.  ? ?Orders:  ?Orders Placed This Encounter  ?Procedures  ? Wound culture  ? ?No orders of the defined types were placed in this encounter. ? ? ? ? Procedures: ?No procedures performed ? ? ?Clinical Data: ?No additional findings. ? ? ?Subjective: ?Chief Complaint  ?Patient presents with  ? Lower Back - Wound Check, Routine Post Op  ? ? ?HPI 56 year old male 2 weeks postop lumbar surgery L3-4 L4-5 with severe spinal stenosis.  Intra-Op dural tear dorsally closed with 4-0 Nurolon and DuraSeal.  Patient was ambuatory no headache no leakage and then after he was home for 5 days started having some serous leakage. I have talked with him almost daily.  He stayed down for several days felt better once he got up he started having some leakage again midportion of his incision and today had slight cloudiness to the drainage.  I talked with him on the phone and asked him to come in today.  He had noticed when he had been up and around he had had some problems with headache.  Headache is been intermittent. ? ?Review of Systems negative for chills or fever positive for headache for has been up for extended period of time. ? ? ?Objective: ?Vital Signs: BP (!) 142/85   Pulse 97   Temp (!) 97 ?F (36.1 ?C)    Ht '5\' 8"'$  (1.727 m)   Wt 253 lb (114.8 kg)   BMI 38.47 kg/m?  ? ?Physical Exam ?Constitutional:   ?   Appearance: He is well-developed.  ?HENT:  ?   Head: Normocephalic and atraumatic.  ?   Right Ear: External ear normal.  ?   Left Ear: External ear normal.  ?Eyes:  ?   Pupils: Pupils are equal, round, and reactive to light.  ?Neck:  ?   Thyroid: No thyromegaly.  ?   Trachea: No tracheal deviation.  ?Cardiovascular:  ?   Rate and Rhythm: Normal rate.  ?Pulmonary:  ?   Effort: Pulmonary effort is normal.  ?   Breath sounds: No wheezing.  ?Abdominal:  ?   General: Bowel sounds are normal.  ?   Palpations: Abdomen is soft.  ?Musculoskeletal:  ?   Cervical back: Neck supple.  ?Skin: ?   General: Skin is warm and dry.  ?   Capillary Refill: Capillary refill takes less than 2 seconds.  ?Neurological:  ?   Mental Status: He is alert and oriented to person, place, and time.  ?Psychiatric:     ?   Behavior: Behavior normal.     ?   Thought Content: Thought content  normal.     ?   Judgment: Judgment normal.  ? ? ?Ortho Exam midportion of the wound has some clear serous drainage might be slightly cloudy.  Prepped with Betadine waited 3 to 5 minutes then additional fluid expressed and cultured. ? ?Specialty Comments:  ?No specialty comments available. ? ?Imaging: ?No results found. ? ? ?PMFS History: ?Patient Active Problem List  ? Diagnosis Date Noted  ? Status post lumbar spine surgery for decompression of spinal cord 12/09/2021  ? Postoperative CSF leak 12/09/2021  ? Lumbar stenosis 11/26/2021  ? Spinal stenosis of lumbar region 07/04/2021  ? Lumbar disc herniation with radiculopathy 07/04/2021  ? History of colonic polyps 08/16/2018  ? GERD (gastroesophageal reflux disease) 07/08/2015  ? Rectal bleeding 07/08/2015  ? ANKLE SPRAIN 09/10/2009  ? CLOSED FRACTURE OF CALCANEUS 08/27/2009  ? SHOULDER STRAIN 02/27/2009  ? ?Past Medical History:  ?Diagnosis Date  ? GERD (gastroesophageal reflux disease)   ? Hypertension   ?   ?Family History  ?Problem Relation Age of Onset  ? Thyroid cancer Mother   ? Heart attack Father   ? Colon cancer Neg Hx   ? Colon polyps Neg Hx   ?  ?Past Surgical History:  ?Procedure Laterality Date  ? COLONOSCOPY N/A 07/22/2015  ? SLF:7 polyps removed/mild diverticulosis/moderate internal hemorrhoids  ? COLONOSCOPY N/A 11/18/2018  ? Procedure: COLONOSCOPY;  Surgeon: Danie Binder, MD;  [facility];  Service: Endoscopy;  Laterality: N/A;  2:15pm  ? EYE SURGERY    ? LUMBAR LAMINECTOMY/DECOMPRESSION MICRODISCECTOMY N/A 11/26/2021  ? Procedure: Lumbar three-four, Lumbar four-five Central Decompression, Lumbar Three-Four Microdiscectomhy;  Surgeon: [name], MD;  Location: [facility];  Service: Orthopedics;  Laterality: N/A;  ? SHOULDER SURGERY Left 09/28/2013  ? fix previous clavicular fracture complication  ? ?Social History  ? ?Occupational History  ? Not on file  ?Tobacco Use  ? Smoking status: Light Smoker  ?  Types: Cigars  ? Smokeless tobacco: Never  ? Tobacco comments:  ?  weekend / social  ?Vaping Use  ? Vaping Use: Never used  ?Substance and Sexual Activity  ? Alcohol use: Not Currently  ?  Comment: social, every 1-2 months approx 1 drink per occasion  ? Drug use: Yes  ?  Types: Marijuana  ?  Comment: sometimes for pain  ? Sexual activity: Not on file  ? ? ? ? ? ? ?

## 2021-12-09 NOTE — H&P (Signed)
Brandon Burton is an 56 y.o. male.   Chief Complaint: Postop persistent lumbar CSF leak HPI: 56 year old male underwent L3-4 L4-5 lumbar decompression surgery for lumbar spinal stenosis with neurogenic claudication on 11/26/2021.  Preop he had [facility] narrowing of the canal down to 5 mm at L3-4 and 6 mm at L4-5.  3 mm dural tear occurred Intra-Op which was repaired with Nurolon sutures interrupted and then sealed with DuraSeal.  Tight closure of the fascia subtendinous tissue and skin was performed by me.  Postoperatively had no headache was amatory in the halls and was discharged home.  Few days later he started having pinpoint drainage from the midportion of the incision that would start and stop despite staying down.  He got more active when he was up and ambulatory noticed significant increased drainage from the incision clear initially.  He was seen today and had slight cloudy drainage from the incision.  Staples are intact temperature is 97.  Patient had about 5 to 6 days red no drainage and then started having drainage again that occurs when he got up and was more active.  Patient to mental delay at home only get up to go the bathroom but once he got more active after several days no problems then he developed some recurrent clear drainage from the incision.  Patient is admitted this time for exploration irrigation debridement dural tear reinforcement likely with a 6-0 Prolene.  Likely placement of some mycin tobramycin beads in the subcutaneous tissue after fascial closure Intra-Op.  Also discussed with patient possibility of placement of CSF drain if needed.  Past Medical History:  Diagnosis Date   GERD (gastroesophageal reflux disease)    Hypertension     Past Surgical History:  Procedure Laterality Date   COLONOSCOPY N/A 07/22/2015   SLF:7 polyps removed/mild diverticulosis/moderate internal hemorrhoids   COLONOSCOPY N/A 11/18/2018   Procedure: COLONOSCOPY;  Surgeon: West Bali, MD;   [facility];  Service: Endoscopy;  Laterality: N/A;  2:15pm   EYE SURGERY     LUMBAR LAMINECTOMY/DECOMPRESSION MICRODISCECTOMY N/A 11/26/2021   Procedure: Lumbar three-four, Lumbar four-five Central Decompression, Lumbar Three-Four Microdiscectomhy;  Surgeon: Eldred Manges, MD;  Location: [facility];  Service: Orthopedics;  Laterality: N/A;   SHOULDER SURGERY Left 09/28/2013   fix previous clavicular fracture complication    Family History  Problem Relation Age of Onset   Thyroid cancer Mother    Heart attack Father    Colon cancer Neg Hx    Colon polyps Neg Hx    Social History:  reports that he has been smoking cigars. He has never used smokeless tobacco. He reports that he does not currently use alcohol. He reports current drug use. Drug: Marijuana.  Allergies: No Known Allergies  Medications Prior to Admission  Medication Sig Dispense Refill   amLODipine (NORVASC) 10 MG tablet Take 10 mg by mouth daily.     gabapentin (NEURONTIN) 100 MG capsule Take 1 capsule (100 mg total) by mouth 3 (three) times daily. 90 capsule 2   methocarbamol (ROBAXIN) 500 MG tablet Take 1 tablet (500 mg total) by mouth every 8 (eight) hours as needed for muscle spasms. 40 tablet 0   omeprazole (PRILOSEC) 40 MG capsule TAKE 1 CAPSULE(40 MG) BY MOUTH DAILY 90 capsule 3   oxyCODONE-acetaminophen (PERCOCET) 5-325 MG tablet Take 1 tablet by mouth every 4 (four) hours as needed for severe pain. 45 tablet 0    Results for orders placed or performed during the hospital  encounter of 12/09/21 (from the past 48 hour(s))  CBC WITH DIFFERENTIAL     Status: Abnormal   Collection Time: 12/09/21  1:20 PM  Result Value Ref Range   WBC 17.5 (H) 4.0 - 10.5 K/uL   RBC 3.99 (L) 4.22 - 5.81 MIL/uL   Hemoglobin 11.5 (L) 13.0 - 17.0 g/dL   HCT 16.1 (L) 09.6 - 04.5 %   MCV 88.0 80.0 - 100.0 fL   MCH 28.8 26.0 - 34.0 pg   MCHC 32.8 30.0 - 36.0 g/dL   RDW 40.9 (H) 81.1 - 91.4 %   Platelets 643 (H) 150 - 400 K/uL    nRBC 0.0 0.0 - 0.2 %   Neutrophils Relative % 81 %   Neutro Abs 14.3 (H) 1.7 - 7.7 K/uL   Lymphocytes Relative 10 %   Lymphs Abs 1.8 0.7 - 4.0 K/uL   Monocytes Relative 8 %   Monocytes Absolute 1.3 (H) 0.1 - 1.0 K/uL   Eosinophils Relative 0 %   Eosinophils Absolute 0.0 0.0 - 0.5 K/uL   Basophils Relative 0 %   Basophils Absolute 0.0 0.0 - 0.1 K/uL   Immature Granulocytes 1 %   Abs Immature Granulocytes 0.10 (H) 0.00 - 0.07 K/uL    Comment: Performed at [facility], [address], [place], [place] [address]  Comprehensive metabolic panel     Status: Abnormal   Collection Time: 12/09/21  1:20 PM  Result Value Ref Range   Sodium 137 135 - 145 mmol/L   Potassium 3.6 3.5 - 5.1 mmol/L   Chloride 103 98 - 111 mmol/L   CO2 25 22 - 32 mmol/L   Glucose, Bld 120 (H) 70 - 99 mg/dL    Comment: Glucose reference range applies only to samples taken after fasting for at least 8 hours.   BUN 13 6 - 20 mg/dL   Creatinine, Ser 6.21 0.61 - 1.24 mg/dL   Calcium 8.9 8.9 - 30.8 mg/dL   Total Protein 7.4 6.5 - 8.1 g/dL   Albumin 3.0 (L) 3.5 - 5.0 g/dL   AST 29 15 - 41 U/L   ALT 31 0 - 44 U/L   Alkaline Phosphatase 86 38 - 126 U/L   Total Bilirubin 0.6 0.3 - 1.2 mg/dL   GFR, Estimated >65 >78 mL/min    Comment: (NOTE) Calculated using the CKD-EPI Creatinine Equation (2021)    Anion gap 9 5 - 15    Comment: Performed at [facility], [address], [place], [place] [address]  Sedimentation rate     Status: Abnormal   Collection Time: 12/09/21  1:20 PM  Result Value Ref Range   Sed Rate 86 (H) 0 - 16 mm/hr    Comment: Performed at [facility], [address], [place], [place] [address]  C-reactive protein     Status: Abnormal   Collection Time: 12/09/21  1:20 PM  Result Value Ref Range   CRP 16.9 (H) <1.0 mg/dL    Comment: Performed at [facility], [address] [place], [place] [address]  Surgical PCR screen     Status: None   Collection Time: 12/09/21  1:30 PM    Specimen: Nasal Mucosa; Nasal Swab  Result Value Ref Range   MRSA, PCR NEGATIVE NEGATIVE   Staphylococcus aureus NEGATIVE NEGATIVE    Comment: (NOTE) The Xpert SA Assay (FDA approved for NASAL specimens in patients 34 years of age and older), is one component of  a comprehensive surveillance program. It is not intended to diagnose infection nor to guide or monitor treatment. Performed at [facility], [address] [place], [place] [address]    No results found.  Review of Systems  Blood pressure 140/90, pulse 100, temperature 100.3 F (37.9 C), temperature source Oral, resp. rate 17, SpO2 100 %. Physical Exam Constitutional:      Appearance: Normal appearance.  HENT:     Head: Normocephalic.     Right Ear: External ear normal.     Left Ear: External ear normal.     Nose: Nose normal.     Mouth/Throat:     Mouth: Mucous membranes are moist.  Eyes:     Extraocular Movements: Extraocular movements intact.     Pupils: Pupils are equal, round, and reactive to light.  Cardiovascular:     Rate and Rhythm: Normal rate.     Pulses: Normal pulses.  Pulmonary:     Effort: Pulmonary effort is normal.  Abdominal:     General: Abdomen is flat.  Musculoskeletal:        General: Normal range of motion.     Cervical back: Normal range of motion.  Skin:    General: Skin is warm.     Capillary Refill: Capillary refill takes less than 2 seconds.  Neurological:     General: No focal deficit present.     Mental Status: He is alert.  Psychiatric:        Mood and Affect: Mood normal.        Behavior: Behavior normal.   Ortho:  bilat strength good . Lumbar incision with drainage without separation of incision. Clear drainage , slightly cloudy.    Assessment/Plan Prepped area with Betadine allowed to dry and then obtained a culture using anaerobic tube for anaerobic and aerobic.  He has no cellulitis patient has no headache.  Plan to be reexploration for persistent or  intermittent CSF leak.  We discussed likelihood of progressive infection if he continues to have intermittent leakage from postop wound.  Patient's wife was present questions elicited and answered.  He understands and agrees to proceed.  Eldred Manges, MD 12/09/2021, 4:58 PM

## 2021-12-09 NOTE — Discharge Summary (Addendum)
? ?Patient ID: ?Geraldo Pitter ?MRN: [id number] ?DOB/AGE: 56-May-1967 56 y.o. ? ?Admit date: 11/26/2021 ?Discharge date: 11/27/2021 ? ?Admission Diagnoses:  ?Active Problems: ?  Spinal stenosis of lumbar region ?  Lumbar disc herniation with radiculopathy ? ? ?Discharge Diagnoses:  ?Active Problems: ?  Spinal stenosis of lumbar region ?  Lumbar disc herniation with radiculopathy ? status post Procedure(s): ?Lumbar three-four, Lumbar four-five Central Decompression, Lumbar Three-Four Microdiscectomhy ? ?Past Medical History:  ?Diagnosis Date  ? GERD (gastroesophageal reflux disease)   ? Hypertension   ? ? ?Surgeries: Procedure(s): ?Lumbar three-four, Lumbar four-five Central Decompression, Lumbar Three-Four Microdiscectomhy on 11/26/2021 ?  ?Consultants:  ? ?Discharged Condition: Improved ? ?Hospital Course: Codylee Patil is an 56 y.o. male who was admitted 11/26/2021 for operative treatment of lumbar stenosis. Patient failed conservative treatments (please see the history and physical for the specifics) and had severe unremitting pain that affects sleep, daily activities and work/hobbies. After pre-op clearance, the patient was taken to the operating room on 11/26/2021 and underwent  Procedure(s): ?Lumbar three-four, Lumbar four-five Central Decompression, Lumbar Three-Four Microdiscectomhy.   ? ?Patient was given perioperative antibiotics:  ?Anti-infectives (From admission, onward)  ? ? Start     Dose/Rate Route Frequency Ordered Stop  ? 11/26/21 1100  ceFAZolin (ANCEF) IVPB 2g/100 mL premix       ? 2 g ?200 mL/hr over 30 Minutes Intravenous On call to O.R. 11/26/21 1048 11/26/21 1310  ? ?  ?  ? ?Patient was given sequential compression devices and early ambulation to prevent DVT.  ? ?Patient benefited maximally from hospital stay.  Intra-Op patient had a small dural tear that was repaired under the operative microscope with Nurolon and DuraSeal applied.  Postop he had no headache was ambulatory up and down the hall without  symptoms and with a dry incision.  At the time of discharge, the patient was urinating/moving their bowels without difficulty, tolerating a regular diet, pain is controlled with oral pain medications and they have been cleared by PT/OT.  Patient was instructed to avoid bending lifting activities and delay and rest.  He is to call if he had any drainage from his incision. ? ?Recent vital signs: No data found.  ? ?Recent laboratory studies: No results for input(s): WBC, HGB, HCT, PLT, NA, K, CL, CO2, BUN, CREATININE, GLUCOSE, INR, CALCIUM in the last 72 hours. ? ?Invalid input(s): PT, 2 ? ? ?Discharge Medications:   ?Allergies as of 11/27/2021   ?No Known Allergies ?  ? ?  ?Medication List  ?  ? ?STOP taking these medications   ? ?diclofenac 75 MG EC tablet ?Commonly known as: VOLTAREN ?  ? ?  ? ?TAKE these medications   ? ?amLODipine 10 MG tablet ?Commonly known as: NORVASC ?Take 10 mg by mouth daily. ?  ?gabapentin 100 MG capsule ?Commonly known as: NEURONTIN ?Take 1 capsule (100 mg total) by mouth 3 (three) times daily. ?  ?methocarbamol 500 MG tablet ?Commonly known as: Robaxin ?Take 1 tablet (500 mg total) by mouth every 8 (eight) hours as needed for muscle spasms. ?What changed:  ?when to take this ?reasons to take this ?  ?omeprazole 40 MG capsule ?Commonly known as: PRILOSEC ?TAKE 1 CAPSULE(40 MG) BY MOUTH DAILY ?  ?oxyCODONE-acetaminophen 5-325 MG tablet ?Commonly known as: Percocet ?Take 1 tablet by mouth every 4 (four) hours as needed for severe pain. ?  ? ?  ? ? ?Diagnostic Studies: DG Lumbar Spine 2-3 Views ? ?Result Date: 11/26/2021 ?CLINICAL DATA:  Cross-table lateral view of lumbar spine for intraoperative localization EXAM: LUMBAR SPINE - 2-3 VIEW COMPARISON:  None. FINDINGS: Portable cross-table lateral views of lumbar spine were done for intraoperative localization. IMPRESSION: Portable lateral views of lumbar spine with done for intraoperative localization. Electronically Signed   By: Elmer Picker M.D.   On: 11/26/2021 16:16   ? ?Discharge Instructions   ? ? Incentive spirometry RT   Complete by: As directed ?  ? ?  ? ? ? Follow-up Information   ? ? [name], MD Follow up in 2 week(s).   ?Specialty: Orthopedic Surgery ?Contact information: ?[address] ?[place] [address] ?[phone] ? ? ?  ?  ? ?  ?  ? ?  ? ? ?Discharge Plan:  discharge to home ? ?Disposition:  ? ? ? ?Signed: ?Benjiman Core  ?12/09/2021, 1:55 PM ? ? ? ?  ?

## 2021-12-09 NOTE — Progress Notes (Signed)
Pharmacy Antibiotic Note ? ?Brandon Burton is a 56 y.o. male admitted on 12/09/2021 with  CSF leak post-op .  Pharmacy has been consulted for cefazolin dosing. SCr 1.1 on admit. ? ?Plan: ?Cefazolin 2g IV q8h ?Monitor clinical progress, c/s, renal function ?F/u de-escalation plan/LOT ? ? ?  ? ?Temp (24hrs), Avg:97 ?F (36.1 ?C), Min:97 ?F (36.1 ?C), Max:97 ?F (36.1 ?C) ? ?Recent Labs  ?Lab 12/09/21 ?1320  ?WBC 17.5*  ?CREATININE 1.10  ?  ?Estimated Creatinine Clearance: 92.3 mL/min (by C-G formula based on SCr of 1.1 mg/dL).   ? ?No Known Allergies ? ? ?Arturo Morton, PharmD, BCPS ?Please check AMION for all [place] contact numbers ?Clinical Pharmacist ?12/09/2021 2:49 PM ? ?

## 2021-12-10 ENCOUNTER — Inpatient Hospital Stay (HOSPITAL_COMMUNITY): Payer: BC Managed Care – PPO | Admitting: Anesthesiology

## 2021-12-10 ENCOUNTER — Encounter (HOSPITAL_COMMUNITY): Payer: Self-pay | Admitting: Orthopaedic Surgery

## 2021-12-10 ENCOUNTER — Encounter (HOSPITAL_COMMUNITY)
Admission: AD | Disposition: A | Discharge status: Home / Self Care | Payer: Self-pay | Source: Ambulatory Visit | Attending: Orthopaedic Surgery

## 2021-12-10 DIAGNOSIS — L24A9 Irritant contact dermatitis due friction or contact with other specified body fluids: Secondary | ICD-10-CM | POA: Diagnosis present

## 2021-12-10 HISTORY — PX: LUMBAR LAMINECTOMY: SHX95

## 2021-12-10 LAB — SARS CORONAVIRUS 2 BY RT PCR (HOSPITAL ORDER, PERFORMED IN ~~LOC~~ HOSPITAL LAB): SARS Coronavirus 2: NEGATIVE

## 2021-12-10 SURGERY — MICRODISCECTOMY LUMBAR LAMINECTOMY
Anesthesia: General

## 2021-12-10 MED ORDER — ROCURONIUM BROMIDE 10 MG/ML (PF) SYRINGE
PREFILLED_SYRINGE | INTRAVENOUS | Status: DC | PRN
Start: 2021-12-10 — End: 2021-12-10
  Administered 2021-12-10: 60 mg via INTRAVENOUS

## 2021-12-10 MED ORDER — 0.9 % SODIUM CHLORIDE (POUR BTL) OPTIME
TOPICAL | Status: DC | PRN
  Administered 2021-12-10: 1000 mL

## 2021-12-10 MED ORDER — GABAPENTIN 100 MG PO CAPS
100.0000 mg | ORAL_CAPSULE | Freq: Three times a day (TID) | ORAL | Status: DC
  Administered 2021-12-10 – 2021-12-17 (×20): 100 mg via ORAL
  Filled 2021-12-10 (×21): qty 1

## 2021-12-10 MED ORDER — BUPIVACAINE HCL (PF) 0.25 % IJ SOLN
INTRAMUSCULAR | Status: DC | PRN
  Administered 2021-12-10: 18 mL

## 2021-12-10 MED ORDER — METHOCARBAMOL 500 MG PO TABS
500.0000 mg | ORAL_TABLET | Freq: Four times a day (QID) | ORAL | Status: DC | PRN
  Administered 2021-12-10 – 2021-12-17 (×10): 500 mg via ORAL
  Filled 2021-12-10 (×10): qty 1

## 2021-12-10 MED ORDER — MIDAZOLAM HCL 2 MG/2ML IJ SOLN
INTRAMUSCULAR | Status: AC
Start: 2021-12-10 — End: ?
  Filled 2021-12-10: qty 2

## 2021-12-10 MED ORDER — VANCOMYCIN HCL 1000 MG IV SOLR
INTRAVENOUS | Status: AC
  Filled 2021-12-10: qty 20

## 2021-12-10 MED ORDER — HYDROMORPHONE HCL 1 MG/ML IJ SOLN
INTRAMUSCULAR | Status: AC
  Filled 2021-12-10: qty 0.5

## 2021-12-10 MED ORDER — SODIUM CHLORIDE 0.9% FLUSH: 3.0000 mL | INTRAVENOUS | Status: DC | PRN

## 2021-12-10 MED ORDER — CEFAZOLIN SODIUM-DEXTROSE 1-4 GM/50ML-% IV SOLN: 1.0000 g | Freq: Three times a day (TID) | INTRAVENOUS | Status: DC

## 2021-12-10 MED ORDER — CHLORHEXIDINE GLUCONATE 0.12 % MT SOLN
OROMUCOSAL | Status: AC
  Filled 2021-12-10: qty 15

## 2021-12-10 MED ORDER — POLYETHYLENE GLYCOL 3350 17 G PO PACK: 17.0000 g | PACK | Freq: Every day | ORAL | Status: DC | PRN

## 2021-12-10 MED ORDER — HYDROMORPHONE HCL 1 MG/ML IJ SOLN
INTRAMUSCULAR | Status: DC | PRN
  Administered 2021-12-10 (×2): .5 mg via INTRAVENOUS

## 2021-12-10 MED ORDER — ONDANSETRON HCL 4 MG/2ML IJ SOLN: 4.0000 mg | Freq: Four times a day (QID) | INTRAMUSCULAR | Status: DC | PRN

## 2021-12-10 MED ORDER — METHOCARBAMOL 1000 MG/10ML IJ SOLN
500.0000 mg | Freq: Four times a day (QID) | INTRAVENOUS | Status: DC | PRN
  Filled 2021-12-10: qty 5

## 2021-12-10 MED ORDER — OXYCODONE HCL 5 MG/5ML PO SOLN: 5.0000 mg | Freq: Once | ORAL | Status: DC | PRN

## 2021-12-10 MED ORDER — OXYCODONE HCL 5 MG PO TABS: 5.0000 mg | ORAL_TABLET | Freq: Once | ORAL | Status: DC | PRN

## 2021-12-10 MED ORDER — OXYCODONE HCL 5 MG PO TABS
5.0000 mg | ORAL_TABLET | ORAL | Status: DC | PRN
  Administered 2021-12-10 – 2021-12-14 (×6): 10 mg via ORAL
  Administered 2021-12-16: 5 mg via ORAL
  Filled 2021-12-10 (×5): qty 2
  Filled 2021-12-10: qty 1
  Filled 2021-12-10: qty 2

## 2021-12-10 MED ORDER — ONDANSETRON HCL 4 MG PO TABS: 4.0000 mg | ORAL_TABLET | Freq: Four times a day (QID) | ORAL | Status: DC | PRN

## 2021-12-10 MED ORDER — DOCUSATE SODIUM 100 MG PO CAPS
100.0000 mg | ORAL_CAPSULE | Freq: Two times a day (BID) | ORAL | Status: DC
  Administered 2021-12-10 – 2021-12-17 (×14): 100 mg via ORAL
  Filled 2021-12-10 (×15): qty 1

## 2021-12-10 MED ORDER — LIDOCAINE 2% (20 MG/ML) 5 ML SYRINGE
INTRAMUSCULAR | Status: DC | PRN
  Administered 2021-12-10: 60 mg via INTRAVENOUS

## 2021-12-10 MED ORDER — VANCOMYCIN HCL 1000 MG IV SOLR
INTRAVENOUS | Status: DC | PRN
  Administered 2021-12-10 (×2): 1000 mg via TOPICAL

## 2021-12-10 MED ORDER — SUGAMMADEX SODIUM 200 MG/2ML IV SOLN
INTRAVENOUS | Status: DC | PRN
  Administered 2021-12-10: 200 mg via INTRAVENOUS

## 2021-12-10 MED ORDER — ACETAMINOPHEN 650 MG RE SUPP: 650.0000 mg | RECTAL | Status: DC | PRN

## 2021-12-10 MED ORDER — FENTANYL CITRATE (PF) 100 MCG/2ML IJ SOLN
INTRAMUSCULAR | Status: AC
  Filled 2021-12-10: qty 2

## 2021-12-10 MED ORDER — MENTHOL 3 MG MT LOZG: 1.0000 | LOZENGE | OROMUCOSAL | Status: DC | PRN

## 2021-12-10 MED ORDER — AMLODIPINE BESYLATE 10 MG PO TABS
10.0000 mg | ORAL_TABLET | Freq: Every day | ORAL | Status: DC
  Administered 2021-12-11 – 2021-12-17 (×7): 10 mg via ORAL
  Filled 2021-12-10 (×8): qty 1

## 2021-12-10 MED ORDER — SODIUM CHLORIDE 0.9 % IV SOLN: 250.0000 mL | INTRAVENOUS | Status: DC

## 2021-12-10 MED ORDER — MIDAZOLAM HCL 2 MG/2ML IJ SOLN
INTRAMUSCULAR | Status: DC | PRN
  Administered 2021-12-10: 2 mg via INTRAVENOUS

## 2021-12-10 MED ORDER — ONDANSETRON HCL 4 MG/2ML IJ SOLN
INTRAMUSCULAR | Status: DC | PRN
  Administered 2021-12-10: 4 mg via INTRAVENOUS

## 2021-12-10 MED ORDER — FENTANYL CITRATE (PF) 250 MCG/5ML IJ SOLN
INTRAMUSCULAR | Status: DC | PRN
  Administered 2021-12-10: 100 ug via INTRAVENOUS
  Administered 2021-12-10: 150 ug via INTRAVENOUS

## 2021-12-10 MED ORDER — FENTANYL CITRATE (PF) 250 MCG/5ML IJ SOLN
INTRAMUSCULAR | Status: AC
  Filled 2021-12-10: qty 5

## 2021-12-10 MED ORDER — PROPOFOL 10 MG/ML IV BOLUS
INTRAVENOUS | Status: DC | PRN
  Administered 2021-12-10: 180 mg via INTRAVENOUS
  Administered 2021-12-10: 20 mg via INTRAVENOUS

## 2021-12-10 MED ORDER — ACETAMINOPHEN 325 MG PO TABS
650.0000 mg | ORAL_TABLET | ORAL | Status: DC | PRN
  Administered 2021-12-14 – 2021-12-15 (×3): 650 mg via ORAL
  Filled 2021-12-10 (×3): qty 2

## 2021-12-10 MED ORDER — TOBRAMYCIN SULFATE 1.2 G IJ SOLR
INTRAMUSCULAR | Status: DC | PRN
  Administered 2021-12-10: 1.2 g via TOPICAL

## 2021-12-10 MED ORDER — LACTATED RINGERS IV SOLN: INTRAVENOUS | Status: DC | PRN

## 2021-12-10 MED ORDER — PHENOL 1.4 % MT LIQD: 1.0000 | OROMUCOSAL | Status: DC | PRN

## 2021-12-10 MED ORDER — BUPIVACAINE HCL (PF) 0.25 % IJ SOLN
INTRAMUSCULAR | Status: AC
  Filled 2021-12-10: qty 30

## 2021-12-10 MED ORDER — PROPOFOL 10 MG/ML IV BOLUS
INTRAVENOUS | Status: AC
  Filled 2021-12-10: qty 20

## 2021-12-10 MED ORDER — HYDROMORPHONE HCL 1 MG/ML IJ SOLN: 0.5000 mg | INTRAMUSCULAR | Status: DC | PRN

## 2021-12-10 MED ORDER — TOBRAMYCIN SULFATE 1.2 G IJ SOLR
INTRAMUSCULAR | Status: AC
  Filled 2021-12-10: qty 1.2

## 2021-12-10 MED ORDER — FENTANYL CITRATE (PF) 100 MCG/2ML IJ SOLN
25.0000 ug | INTRAMUSCULAR | Status: DC | PRN
  Administered 2021-12-10: 50 ug via INTRAVENOUS

## 2021-12-10 MED ORDER — SODIUM CHLORIDE 0.9 % IV SOLN: INTRAVENOUS | Status: DC

## 2021-12-10 MED ORDER — PANTOPRAZOLE SODIUM 40 MG PO TBEC
40.0000 mg | DELAYED_RELEASE_TABLET | Freq: Every day | ORAL | Status: DC
Start: 2021-12-11 — End: 2021-12-17
  Administered 2021-12-11 – 2021-12-17 (×7): 40 mg via ORAL
  Filled 2021-12-10 (×8): qty 1

## 2021-12-10 MED ORDER — SODIUM CHLORIDE 0.9% FLUSH
3.0000 mL | Freq: Two times a day (BID) | INTRAVENOUS | Status: DC
  Administered 2021-12-10 – 2021-12-17 (×13): 3 mL via INTRAVENOUS

## 2021-12-10 SURGICAL SUPPLY — 49 items
ADH SKN CLS APL DERMABOND .7 (GAUZE/BANDAGES/DRESSINGS) ×1
APL SRG 60D 8 XTD TIP BNDBL (TIP) ×1
BAG COUNTER SPONGE SURGICOUNT (BAG) ×3 IMPLANT
BAG SPNG CNTER NS LX DISP (BAG) ×1
BUR ROUND FLUTED 4 SOFT TCH (BURR) IMPLANT
CANISTER SUCT 3000ML PPV (MISCELLANEOUS) ×3 IMPLANT
COVER SURGICAL LIGHT HANDLE (MISCELLANEOUS) ×3 IMPLANT
DERMABOND ADVANCED (GAUZE/BANDAGES/DRESSINGS) ×1
DERMABOND ADVANCED .7 DNX12 (GAUZE/BANDAGES/DRESSINGS) ×2 IMPLANT
DRAPE MICROSCOPE LEICA (MISCELLANEOUS) ×3 IMPLANT
DRSG MEPILEX BORDER 4X4 (GAUZE/BANDAGES/DRESSINGS) IMPLANT
DRSG MEPILEX BORDER 4X8 (GAUZE/BANDAGES/DRESSINGS) ×1 IMPLANT
DURAPREP 26ML APPLICATOR (WOUND CARE) ×3 IMPLANT
DURASEAL APPLICATOR TIP (TIP) ×1 IMPLANT
DURASEAL SPINE SEALANT 3ML (MISCELLANEOUS) ×1 IMPLANT
ELECT REM PT RETURN 9FT ADLT (ELECTROSURGICAL) ×2
ELECTRODE REM PT RTRN 9FT ADLT (ELECTROSURGICAL) ×2 IMPLANT
GAUZE SPONGE 4X4 12PLY STRL (GAUZE/BANDAGES/DRESSINGS) ×2 IMPLANT
GLOVE SRG 8 PF TXTR STRL LF DI (GLOVE) ×4 IMPLANT
GLOVE SURG ORTHO LTX SZ7.5 (GLOVE) ×6 IMPLANT
GLOVE SURG UNDER POLY LF SZ8 (GLOVE) ×4
GOWN STRL REUS W/ TWL LRG LVL3 (GOWN DISPOSABLE) ×2 IMPLANT
GOWN STRL REUS W/ TWL XL LVL3 (GOWN DISPOSABLE) ×2 IMPLANT
GOWN STRL REUS W/TWL 2XL LVL3 (GOWN DISPOSABLE) ×3 IMPLANT
GOWN STRL REUS W/TWL LRG LVL3 (GOWN DISPOSABLE) ×2
GOWN STRL REUS W/TWL XL LVL3 (GOWN DISPOSABLE) ×2
KIT BASIN OR (CUSTOM PROCEDURE TRAY) ×3 IMPLANT
KIT STIMULAN RAPID CURE  10CC (Orthopedic Implant) ×2 IMPLANT
KIT STIMULAN RAPID CURE 10CC (Orthopedic Implant) IMPLANT
KIT TURNOVER KIT B (KITS) ×3 IMPLANT
NDL SPNL 18GX3.5 QUINCKE PK (NEEDLE) ×2 IMPLANT
NEEDLE SPNL 18GX3.5 QUINCKE PK (NEEDLE) ×2 IMPLANT
NS IRRIG 1000ML POUR BTL (IV SOLUTION) ×3 IMPLANT
PACK LAMINECTOMY ORTHO (CUSTOM PROCEDURE TRAY) ×3 IMPLANT
PAD ARMBOARD 7.5X6 YLW CONV (MISCELLANEOUS) ×6 IMPLANT
PATTIES SURGICAL .5 X.5 (GAUZE/BANDAGES/DRESSINGS) ×1 IMPLANT
PATTIES SURGICAL .5 X1 (DISPOSABLE) ×1 IMPLANT
PATTIES SURGICAL .75X.75 (GAUZE/BANDAGES/DRESSINGS) IMPLANT
SUT PROLENE 6 0 P 3 18 (SUTURE) ×2 IMPLANT
SUT VIC AB 1 CTX 27 (SUTURE) ×1 IMPLANT
SUT VIC AB 1 CTX 36 (SUTURE) ×2
SUT VIC AB 1 CTX36XBRD ANBCTR (SUTURE) ×2 IMPLANT
SUT VIC AB 2-0 CT1 27 (SUTURE) ×2
SUT VIC AB 2-0 CT1 TAPERPNT 27 (SUTURE) ×2 IMPLANT
SUT VIC AB 3-0 X1 27 (SUTURE) IMPLANT
SYR 20ML ECCENTRIC (SYRINGE) IMPLANT
TOWEL GREEN STERILE (TOWEL DISPOSABLE) ×3 IMPLANT
TOWEL GREEN STERILE FF (TOWEL DISPOSABLE) ×3 IMPLANT
WATER STERILE IRR 1000ML POUR (IV SOLUTION) ×3 IMPLANT

## 2021-12-10 NOTE — Interval H&P Note (Signed)
History and Physical Interval Note: ? ?12/10/2021 ?1:53 PM ? ?Brandon Burton  has presented today for surgery, with the diagnosis of post op lumbar decompression drainage.  The various methods of treatment have been discussed with the patient and family. After consideration of risks, benefits and other options for treatment, the patient has consented to  Procedure(s): ?LUMBAR RE-EXPLORATION (N/A) as a surgical intervention.  The patient's history has been reviewed, patient examined, no change in status, stable for surgery.  I have reviewed the patient's chart and labs.  Questions were answered to the patient's satisfaction.   ? ? ?[name] ? ? ?

## 2021-12-10 NOTE — Anesthesia Procedure Notes (Signed)
Procedure Name: Intubation ?Date/Time: 12/10/2021 4:14 PM ?Performed by: Babs Bertin, CRNA ?Pre-anesthesia Checklist: Patient identified, Emergency Drugs available, Suction available and Patient being monitored ?Patient Re-evaluated:Patient Re-evaluated prior to induction ?Oxygen Delivery Method: Circle System Utilized ?Preoxygenation: Pre-oxygenation with 100% oxygen ?Induction Type: IV induction ?Ventilation: Mask ventilation without difficulty ?Laryngoscope Size: Mac and 3 ?Grade View: Grade III ?Tube type: Oral ?Tube size: 8.0 mm ?Number of attempts: 1 ?Airway Equipment and Method: Stylet and Oral airway ?Placement Confirmation: ETT inserted through vocal cords under direct vision, positive ETCO2 and breath sounds checked- equal and bilateral ?Secured at: 23 cm ?Tube secured with: Tape ?Dental Injury: Teeth and Oropharynx as per pre-operative assessment  ? ? ? ? ?

## 2021-12-10 NOTE — Anesthesia Preprocedure Evaluation (Signed)
Anesthesia Evaluation  ?Patient identified by MRN, date of birth, ID band ?Patient awake ? ? ? ?Reviewed: ?Allergy & Precautions, H&P , NPO status , Patient's Chart, lab work & pertinent test results ? ?Airway ?Mallampati: II ? ? ?Neck ROM: full ? ? ? Dental ?  ?Pulmonary ?Current Smoker,  ?  ?breath sounds clear to auscultation ? ? ? ? ? ? Cardiovascular ?hypertension,  ?Rhythm:regular Rate:Normal ? ? ?  ?Neuro/Psych ? Neuromuscular disease   ? GI/Hepatic ?GERD  ,  ?Endo/Other  ?Morbid obesity ? Renal/GU ?  ? ?  ?Musculoskeletal ? ? Abdominal ?  ?Peds ? Hematology ?  ?Anesthesia Other Findings ? ? Reproductive/Obstetrics ? ?  ? ? ? ? ? ? ? ? ? ? ? ? ? ?  ?  ? ? ? ? ? ? ? ? ?Anesthesia Physical ?Anesthesia Plan ? ?ASA: 2 ? ?Anesthesia Plan: General  ? ?Post-op Pain Management:   ? ?Induction: Intravenous ? ?PONV Risk Score and Plan: 1 and Ondansetron, Dexamethasone, Midazolam and Treatment may vary due to age or medical condition ? ?Airway Management Planned: Oral ETT ? ?Additional Equipment:  ? ?Intra-op Plan:  ? ?Post-operative Plan: Extubation in OR ? ?Informed Consent: I have reviewed the patients History and Physical, chart, labs and discussed the procedure including the risks, benefits and alternatives for the proposed anesthesia with the patient or authorized representative who has indicated his/her understanding and acceptance.  ? ? ? ?Dental advisory given ? ?Plan Discussed with: CRNA, Anesthesiologist and Surgeon ? ?Anesthesia Plan Comments:   ? ? ? ? ? ? ?Anesthesia Quick Evaluation ? ?

## 2021-12-10 NOTE — Op Note (Addendum)
Preop diagnosis: Post lumbar decompression with CSF leak. ? ?Postop diagnosis: Same ? ?Procedure: Lumbar exploration L3-L5 decompression with dural repair. ? ?Surgeon: Rodell Perna, MD ? ?Assistant: Basil Dess MD medically necessary and present for the entire procedure ? ?Anesthesia General orotracheal plus Marcaine skin local. ? ?EBL less than 100 cc. ? ?Brief history 56 year old male had two-level decompression with small dural tear that occurred at the L4-5 level dorsally repaired with few interrupted 4-0 Nurolon sutures.  He did well with no headache went home was walking was instructed lay around but was feeling good after few days started being very active and then had clear fluid drainage from his incision.  Some intermittent headache symptoms.  He noticed some cloudiness to the dressing when I saw him in the office yesterday and he was admitted after cultures were obtained from the onset CSF leakage.  Patient was afebrile at that time but did have admission white count of 17,500.  He has had elevated sed rate and CRP.  He is taken back to the operating room at this time for exploration and repeat repair. ? ?Procedure: After induction of anesthesia patient placed prone after intubation on chest rolls he was already on some Ancef prophylaxis which was started after cultures were obtained in the office when he was admitted yesterday.  Back was prepped with DuraPrep careful padding ulnar nerve shoulders calf pump wrist.  Timeout procedure was completed DuraPrep area squared with towels and laminectomy sheets and drapes.  Staples were removed and few Vicryl were cut wound was spread open and there was clear fluid.  An aerobic and anaerobic culture was obtained.  There is no necrotic tissue.  We debrided a little bit of fat along the edges and then opened up the deep fascia which had been closed with Vicryl sutures.  Lamina above and below levels of decompression are palpated and there is some leakage that occurred  at the very top just underneath the lamina and there appeared to be a small tube of tissue that as it was spread open followed down to the area of the dural repair.  With a 4-0 Nurolon the operative microscope was draped brought and it appeared that he had split right next to the suture where it been placed at the inferior aspect.  Where there displayed multiple small nerve root was poking out it was tucked back in and held by Dr. Louanne Skye and then using the operative microscope sutures were placed and tied down securely.  With the [facility] 4 some 6-0 Prolene was used for interrupted sutures side to side right to left and some proximal to distal total of 3 sutures were placed.  He was bagged by the CRNA to pressure of 30 with no areas of leakage.  Repeat irrigation repeat bagging again no CSF leak.  Area was irrigated with saline solution and dried with patties and Dermabond was mixed and the blue Nurolon group was applied down over the area of previous repair.  2 weeks ago we did apply Dermabond after dural repair which was green but there was no remaining Dermabond present from previous application for 2 weeks ago and it had absorbed.  I then meticulously closed the fascial layer with #1 Vicryl interrupted after placing some Vanco powder down to the deep tissue.  Particular interrupted #1 Vicryl closure.  Some tobramycin and vancomycin beads had been mixed before the patient had actually gotten into the operating room that despite an hour time they still were pasty and  some portions of the B-plus paste was used and placed in the subcutaneous tissue.  This was the by observable speech which is now relieved by observable paste with the vancomycin and tobramycin mixed in.  Skin was closed with interrupted vertical mattress 2-0 nylon sutures.  Knots were tied off to the side and then Dermabond was placed sealing the skin.  Once Dermabond dried a Mepilex dressing was applied.  There is no cellulitis that was present at  the area of the incision.  Patient was rolled over Foley catheter was placed in the next bated and transferred to the covering. ? ?Patient was neurologically intact and recovery room.  Patient be flat in bed for 3 days to protect the dural repair. ?

## 2021-12-10 NOTE — Transfer of Care (Signed)
Immediate Anesthesia Transfer of Care Note ? ?Patient: Brandon Burton ? ?Procedure(s) Performed: LUMBAR RE-EXPLORATION ? ?Patient Location: PACU ? ?Anesthesia Type:General ? ?Level of Consciousness: awake, alert  and oriented ? ?Airway & Oxygen Therapy: Patient Spontanous Breathing ? ?Post-op Assessment: Report given to RN and Post -op Vital signs reviewed and stable ? ?Post vital signs: Reviewed and stable ? ?Last Vitals:  ?Vitals Value Taken Time  ?BP    ?Temp    ?Pulse    ?Resp    ?SpO2    ? ? ?Last Pain:  ?Vitals:  ? 12/10/21 1329  ?TempSrc: Oral  ?PainSc:   ?   ? ?  ? ?Complications: No notable events documented. ?

## 2021-12-11 ENCOUNTER — Encounter: Payer: BC Managed Care – PPO | Admitting: Orthopaedic Surgery

## 2021-12-11 LAB — BASIC METABOLIC PANEL
Anion gap: 9 (ref 5–15)
BUN: 10 mg/dL (ref 6–20)
CO2: 25 mmol/L (ref 22–32)
Calcium: 8.4 mg/dL — ABNORMAL LOW (ref 8.9–10.3)
Chloride: 102 mmol/L (ref 98–111)
Creatinine, Ser: 1.07 mg/dL (ref 0.61–1.24)
GFR, Estimated: >60 mL/min (ref >60)
Glucose, Bld: 113 mg/dL — ABNORMAL HIGH (ref 70–99)
Potassium: 3.7 mmol/L (ref 3.5–5.1)
Sodium: 136 mmol/L (ref 135–145)

## 2021-12-11 MED ORDER — CHLORHEXIDINE GLUCONATE CLOTH 2 % EX PADS
6.0000 | MEDICATED_PAD | Freq: Every day | CUTANEOUS | Status: DC
  Administered 2021-12-11 – 2021-12-16 (×6): 6 via TOPICAL

## 2021-12-11 NOTE — Anesthesia Postprocedure Evaluation (Signed)
Anesthesia Post Note ? ?Patient: Brandon Burton ? ?Procedure(s) Performed: LUMBAR RE-EXPLORATION ? ?  ? ?Patient location during evaluation: PACU ?Anesthesia Type: General ?Level of consciousness: awake and alert ?Pain management: pain level controlled ?Vital Signs Assessment: post-procedure vital signs reviewed and stable ?Respiratory status: spontaneous breathing, nonlabored ventilation, respiratory function stable and patient connected to nasal cannula oxygen ?Cardiovascular status: blood pressure returned to baseline and stable ?Postop Assessment: no apparent nausea or vomiting ?Anesthetic complications: no ? ? ?No notable events documented. ? ?Last Vitals:  ?Vitals:  ? 12/10/21 2329 12/11/21 0336  ?BP: 134/84 (!) 145/76  ?Pulse: 97 89  ?Resp: 16   ?Temp: 37.3 ?C 37.3 ?C  ?SpO2: 98% 100%  ?  ?Last Pain:  ?Vitals:  ? 12/11/21 0336  ?TempSrc: Oral  ?PainSc: 10-Worst pain ever  ? ? ?  ?LLE Sensation: Full sensation (a little numbness and tingling in big toe) (12/11/21 0753) ?  ?  ?  ?  ? ?[place] ? ? ? ? ?

## 2021-12-11 NOTE — Progress Notes (Addendum)
Patient ID: Brandon Burton, male   DOB: 10/04/1965, 56 y.o.   MRN: [id number] ? ? ?Subjective: ?1 Day Post-Op Procedure(s) (LRB): ?LUMBAR RE-EXPLORATION (N/A) ?Patient reports pain as mild.  No headache.  ? ?Objective: ?Vital signs in last 24 hours: ?Temp:  [98.1 ?F (36.7 ?C)-100.4 ?F (38 ?C)] 100.4 ?F (38 ?C) (03/16 4315) ?Pulse Rate:  [83-101] 90 (03/16 0812) ?Resp:  [11-21] 16 (03/16 4008) ?BP: (112-161)/(63-110) 161/91 (03/16 6761) ?SpO2:  [94 %-100 %] 100 % (03/16 0812) ?Weight:  [114.8 kg] 114.8 kg (03/15 1329) ? ?Intake/Output from previous day: ?03/15 0701 - 03/16 0700 ?In: 9509 [I.V.:1600; IV Piggyback:100] ?Out: 1350 [Urine:1300; Blood:50] ?Intake/Output this shift: ?No intake/output data recorded. ? ?Recent Labs  ?  12/09/21 ?1320  ?HGB 11.5*  ? ?Recent Labs  ?  12/09/21 ?1320  ?WBC 17.5*  ?RBC 3.99*  ?HCT 35.1*  ?PLT 643*  ? ?Recent Labs  ?  12/09/21 ?1320 12/11/21 ?0343  ?NA 137 136  ?K 3.6 3.7  ?CL 103 102  ?CO2 25 25  ?BUN 13 10  ?CREATININE 1.10 1.07  ?GLUCOSE 120* 113*  ?CALCIUM 8.9 8.4*  ? ?No results for input(s): LABPT, INR in the last 72 hours. ? ?Neurologically intact. No sensory or motor deficiit.  Dressing dry.  ?No results found. ? ?Assessment/Plan: ?1 Day Post-Op Procedure(s) (LRB): ?LUMBAR RE-EXPLORATION (N/A) ?Plan.    Foley stays in . Flat until Saturday 6 PM . He can logroll but head stays down .  Dressing dry.  ?Culture from office nothing so far.  Operating room culture few WBC no organisms on Gram stain, culture neg at 12 hrs. Continue Ancef pending culture results.  ? ?[name] ?12/11/2021, 8:37 AM ? ? ? ? ? ?

## 2021-12-12 ENCOUNTER — Encounter (HOSPITAL_COMMUNITY): Payer: Self-pay | Admitting: Orthopaedic Surgery

## 2021-12-12 ENCOUNTER — Other Ambulatory Visit (HOSPITAL_COMMUNITY): Payer: Self-pay

## 2021-12-12 DIAGNOSIS — G9782 Other postprocedural complications and disorders of nervous system: Secondary | ICD-10-CM

## 2021-12-12 DIAGNOSIS — G9600 Cerebrospinal fluid leak, unspecified: Secondary | ICD-10-CM | POA: Diagnosis not present

## 2021-12-12 LAB — WOUND CULTURE
MICRO NUMBER:: 13128412
SPECIMEN QUALITY:: ADEQUATE

## 2021-12-12 MED ORDER — VANCOMYCIN HCL IN DEXTROSE 1-5 GM/200ML-% IV SOLN
1000.0000 mg | Freq: Two times a day (BID) | INTRAVENOUS | Status: DC
  Administered 2021-12-13 – 2021-12-15 (×5): 1000 mg via INTRAVENOUS
  Filled 2021-12-12 (×7): qty 200

## 2021-12-12 MED ORDER — ENSURE ENLIVE PO LIQD
237.0000 mL | Freq: Two times a day (BID) | ORAL | Status: DC
  Administered 2021-12-12 – 2021-12-17 (×10): 237 mL via ORAL

## 2021-12-12 MED ORDER — VANCOMYCIN HCL 10 G IV SOLR
2500.0000 mg | Freq: Once | INTRAVENOUS | Status: AC
  Administered 2021-12-12: 2500 mg via INTRAVENOUS
  Filled 2021-12-12: qty 2500

## 2021-12-12 NOTE — TOC Benefit Eligibility Note (Signed)
Patient Advocate Encounter ?  ?Insurance verification completed.   ?  ?The patient is currently admitted and upon discharge could be taking LINEZOLID. ?  ?The current 14 day co-pay is, $10.  ? ?The patient is currently admitted and upon discharge could be taking CEFADROXIL. ?  ?The current 14 day co-pay is, $10.  ? ?The patient is insured through [facility]. ? ? ?  ? ?

## 2021-12-12 NOTE — Progress Notes (Addendum)
Patient ID: Brandon Burton, male   DOB: Aug 04, 1966, 56 y.o.   MRN: [id number] ? ? ?Subjective: ?2 Days Post-Op Procedure(s) (LRB): ?LUMBAR RE-EXPLORATION (N/A) ?Patient reports pain as mild.   ? ?Objective: ?Vital signs in last 24 hours: ?Temp:  [98.4 ?F (36.9 ?C)-100.3 ?F (37.9 ?C)] 99.2 ?F (37.3 ?C) (03/17 0849) ?Pulse Rate:  [85-100] 94 (03/17 0849) ?Resp:  [17-19] 19 (03/17 0849) ?BP: (137-165)/(79-96) 165/95 (03/17 0849) ?SpO2:  [94 %-100 %] 98 % (03/17 0849) ? ?Intake/Output from previous day: ?03/16 0701 - 03/17 0700 ?In: 32 [P.O.:412] ?Out: 2700 [Urine:2700] ?Intake/Output this shift: ?Total I/O ?In: 3 [I.V.:3] ?Out: 1100 [Urine:1100] ? ?Recent Labs  ?  12/09/21 ?1320  ?HGB 11.5*  ? ?Recent Labs  ?  12/09/21 ?1320  ?WBC 17.5*  ?RBC 3.99*  ?HCT 35.1*  ?PLT 643*  ? ?Recent Labs  ?  12/09/21 ?1320 12/11/21 ?0343  ?NA 137 136  ?K 3.6 3.7  ?CL 103 102  ?CO2 25 25  ?BUN 13 10  ?CREATININE 1.10 1.07  ?GLUCOSE 120* 113*  ?CALCIUM 8.9 8.4*  ? ?No results for input(s): LABPT, INR in the last 72 hours. ? ?Neurologically intact  Incision was dry for 2 days and now with bloody drainage expressible mixed with antibiotic paste expressible between sutures despite tight closure and dermabond that was used.   ?No results found. ? ?Assessment/Plan: ?2 Days Post-Op Procedure(s) (LRB): ?LUMBAR RE-EXPLORATION (N/A) ?Plan:  flat until I see him Monday.   On Ancef .  Office cuture from closed incision when he was stapled rare Streptococcus agalactiae.  OR cultures neg .   Discussed with ID VanDamm who recommended Amoxicillin '500mg'$  TID if nothing changes on culture by discharge.  ?Currently he remains flat in bed OK to logroll with dressing changes as needed with betadine swab then dry dresssings.  DIscussed option of prevena with NSU team however if there is any CSF leak this could make it worse and persist. Albumin 3.0 , Ensure bid ordered and pt will increase protein intake to help heal incision. Needs to seal off wound . Risk of  DVT vs. Risk of wound bleeding and epidural hematoma . SCD currently. May add lovenox Monday. ? ?Brandon Burton ?12/12/2021, 10:47 AM ? ?Addendum:  since I stated note above OR cultures show rare staph EPI, rare strept agalactiae and rare staph Lugdunensis  ?Will get formal ID consult ? ? ? ?

## 2021-12-12 NOTE — Progress Notes (Addendum)
Pharmacy Antibiotic Note ? ?Brandon Burton is a 56 y.o. male admitted on 12/09/2021 with postop CSF leak after 3/1 spinal decompression. Pharmacy has been consulted for vancomycin dosing. ? ?D4 cefazolin  ?3/17 lumbar exploration/debridement and dural tear reinforcement ?WBC 17.5, Tmax/24h 100.6F ?SCr 1.07 - at baseline ? ?Plan: ?Give vancomycin 2500 mg IV x1 ?Vancomycin 1000 mg IV every 12 hours ?   > eAUC 525, SCr used 1.07, Vd 0.5 ?Monitor renal function, CBC, cultures/sensitivities, LOT  ?Pending sensitivities - de-escalate to either Zyvox vs Cefadroxil x14d ? ?Temp (24hrs), Avg:99.3 ?F (37.4 ?C), Min:98.4 ?F (36.9 ?C), Max:100.3 ?F (37.9 ?C) ? ?Recent Labs  ?Lab 12/09/21 ?1320 12/11/21 ?0343  ?WBC 17.5*  --   ?CREATININE 1.10 1.07  ?  ?Estimated Creatinine Clearance: 94.9 mL/min (by C-G formula based on SCr of 1.07 mg/dL).   ? ?No Known Allergies ? ?Antimicrobials this admission: ?Cefazolin 3/14 >> 3/17 ?Vancomycin 3/17 >> ? ?Dose adjustments this admission: ?none ? ?Microbiology results: ?3/14 Wound Cx: strep agalactiae ?3/15 Lumbar Cx - rare staph epi, strep agalactiae, staph lugdunensis - reincubated  ? ?Thank you for allowing pharmacy to be a part of this patient?s care. ? ?Laurey Arrow, PharmD ?PGY1 Pharmacy Resident ?12/12/2021  12:39 PM ? ?Please check [email or web address] for unit-specific pharmacy phone numbers. ? ?

## 2021-12-12 NOTE — Progress Notes (Addendum)
RN changed pt. Surgical dressing for the second time this shift as well as having to change the bed pad as it had saturated that as well. Incision actively draining while changing and begins to saturate the new dressing as soon as applied.  ?

## 2021-12-12 NOTE — Consult Note (Signed)
? ? ? ?[place] for Infectious Disease   ? ?Date of Admission:  12/09/2021    ? ?Total days of antibiotics 3 ?        ?      ?Reason for Consult: Post Operative Wound Infection   ?Referring Provider: Dr. Lorin Mercy  ?Primary Care Provider: Asencion Noble, MD ? ? ?ASSESSMENT: ? ?[name] is a 56 y/o male with spinal stenosis and neurological claudication s/p decompression on 11/26/21 with course complicated by increasing drainage concern for postoperative CSF leak and possible postoperative infection. POD # 2 with surgical specimens being polymicrobial with Staphylococcus epidermidis, Staphylococcus lugdunensis and Streptococcus agalactiae. Change antibiotics to vancomycin pending sensitivities. Likely can treat with 2 weeks of oral antibiotics with either Zyvox or Cefadroxil. Monitor renal function and vancomycin levels while on vancomycin. Post-surgical wound care per Dr. Lorin Mercy.  ? ?PLAN: ? ?Change antibiotic to vancomycin ?Monitor renal function and vancomycin levels for therapeutic drug monitoring.  ?Await sensitivity with narrowing of antibiotics to Zyvox or Cefadroxil  ?Post-operative wound care per Dr. Lorin Mercy.  ? ?Dr. Gale Journey is available over the weekend for ID related questions and will be monitoring cultures.  ? ? ?Principal Problem: ?  Postoperative CSF leak ?Active Problems: ?  Status post lumbar spine surgery for decompression of spinal cord ?  Wound drainage ? ? ? amLODipine  10 mg Oral Daily  ? Chlorhexidine Gluconate Cloth  6 each Topical Daily  ? docusate sodium  100 mg Oral BID  ? feeding supplement  237 mL Oral BID BM  ? gabapentin  100 mg Oral TID  ? mupirocin ointment  1 application. Nasal BID  ? pantoprazole  40 mg Oral Daily  ? sodium chloride flush  3 mL Intravenous Q12H  ? ? ? ?HPI: Brandon Burton is a 56 y.o. male with previous medical history of hypertension, GERD, and L3-L4 and L4-L5 stenosis s/p decompression admitted with increased drainage from his incision site.  ? ?Mr. Fedak initially  underwent L3-L4 and L4-L5 lumbar decompression on 11/26/21 for spinal stenosis and neurogenic claudication. Had a 3 mm dural tear that occurred intraoperatively. Has been having worsening drainage from his incision site which was clear initially and then noted to become cloudy. Now admitted for exploration and debridement and dural tear reinforcement. Clear fluid following staple removal was sent for culture. ? ?Mr. Bozzi is POD #2 with surgical specimens being polymicrobial with Staphylococcus epidermidis, Streptococcus agalactiae, and Staphylococcus lugdunensis. Currently on Day 3 of antibiotic treatments with Cefazolin. Intraoperatively received tobramycin and vancomycin within the wound.  ? ? ?Review of Systems: ?Review of Systems  ?Constitutional:  Negative for chills, fever and weight loss.  ?Respiratory:  Negative for cough, shortness of breath and wheezing.   ?Cardiovascular:  Negative for chest pain and leg swelling.  ?Gastrointestinal:  Negative for abdominal pain, constipation, diarrhea, nausea and vomiting.  ?Skin:  Negative for rash.  ? ? ?Past Medical History:  ?Diagnosis Date  ? GERD (gastroesophageal reflux disease)   ? Hypertension   ? ? ?Social History  ? ?Tobacco Use  ? Smoking status: Light Smoker  ?  Types: Cigars  ? Smokeless tobacco: Never  ? Tobacco comments:  ?  weekend / social  ?Vaping Use  ? Vaping Use: Never used  ?Substance Use Topics  ? Alcohol use: Not Currently  ?  Comment: social, every 1-2 months approx 1 drink per occasion  ? Drug use: Yes  ?  Types: Marijuana  ?  Comment: sometimes for  pain  ? ? ?Family History  ?Problem Relation Age of Onset  ? Thyroid cancer Mother   ? Heart attack Father   ? Colon cancer Neg Hx   ? Colon polyps Neg Hx   ? ? ?No Known Allergies ? ?OBJECTIVE: ?Blood pressure (!) 165/95, pulse 94, temperature 99.2 ?F (37.3 ?C), temperature source Oral, resp. rate 19, height '5\' 8"'$  (1.727 m), weight 114.8 kg, SpO2 98 %. ? ?Physical Exam ?Constitutional:   ?    General: He is not in acute distress. ?   Appearance: He is well-developed.  ?   Comments: Lying in bed in right decubitus position; pleasant.   ?Cardiovascular:  ?   Rate and Rhythm: Normal rate and regular rhythm.  ?   Heart sounds: Normal heart sounds.  ?Pulmonary:  ?   Effort: Pulmonary effort is normal.  ?   Breath sounds: Normal breath sounds.  ?Musculoskeletal:  ?   Comments: Post-surgical dressing is clean, dry and intact.   ?Skin: ?   General: Skin is warm and dry.  ?Neurological:  ?   Mental Status: He is alert and oriented to person, place, and time.  ?Psychiatric:     ?   Mood and Affect: Mood normal.  ? ? ?Lab Results ?Lab Results  ?Component Value Date  ? WBC 17.5 (H) 12/09/2021  ? HGB 11.5 (L) 12/09/2021  ? HCT 35.1 (L) 12/09/2021  ? MCV 88.0 12/09/2021  ? PLT 643 (H) 12/09/2021  ?  ?Lab Results  ?Component Value Date  ? CREATININE 1.07 12/11/2021  ? BUN 10 12/11/2021  ? NA 136 12/11/2021  ? K 3.7 12/11/2021  ? CL 102 12/11/2021  ? CO2 25 12/11/2021  ?  ?Lab Results  ?Component Value Date  ? ALT 31 12/09/2021  ? AST 29 12/09/2021  ? ALKPHOS 86 12/09/2021  ? BILITOT 0.6 12/09/2021  ?  ? ?Microbiology: ?Recent Results (from the past 240 hour(s))  ?Wound culture     Status: Abnormal  ? Collection Time: 12/09/21 11:57 AM  ? Specimen: Wound  ?Result Value Ref Range Status  ? MICRO NUMBER: [id number]  Final  ? SPECIMEN QUALITY: Adequate  Final  ? SOURCE: WOUND (SITE NOT SPECIFIED)  Final  ? STATUS: FINAL  Final  ? GRAM STAIN:   Final  ?  Rare Polymorphonuclear leukocytes Rare epithelial cells No organisms seen  ? ISOLATE 1: Streptococcus agalactiae (A)  Final  ?  Comment: Scant growth of Group B Streptococcus isolated Beta-hemolytic streptococci are predictably susceptible to Penicillin and other beta-lactams. Susceptibility testing not routinely performed. Please contact the laboratory within 3 days if susceptibility  ?testing is desired. ?  ?Surgical PCR screen     Status: None  ? Collection Time: 12/09/21   1:30 PM  ? Specimen: Nasal Mucosa; Nasal Swab  ?Result Value Ref Range Status  ? MRSA, PCR NEGATIVE NEGATIVE Final  ? Staphylococcus aureus NEGATIVE NEGATIVE Final  ?  Comment: (NOTE) ?The Xpert SA Assay (FDA approved for NASAL specimens in patients 98 ?years of age and older), is one component of a comprehensive ?surveillance program. It is not intended to diagnose infection nor to ?guide or monitor treatment. ?Performed at [facility], [place] [address], [place], [place] ?[address] ?  ?SARS Coronavirus 2 by RT PCR (hospital order, performed in [facility] hospital lab) Nasopharyngeal Nasopharyngeal Swab     Status: None  ? Collection Time: 12/10/21  8:00 AM  ? Specimen: Nasopharyngeal Swab  ?Result Value Ref  Range Status  ? SARS Coronavirus 2 NEGATIVE NEGATIVE Final  ?  Comment: (NOTE) ?SARS-CoV-2 target nucleic acids are NOT DETECTED. ? ?The SARS-CoV-2 RNA is generally detectable in upper and lower ?respiratory specimens during the acute phase of infection. The lowest ?concentration of SARS-CoV-2 viral copies this assay can detect is 250 ?copies / mL. A negative result does not preclude SARS-CoV-2 infection ?and should not be used as the sole basis for treatment or other ?patient management decisions.  A negative result may occur with ?improper specimen collection / handling, submission of specimen other ?than nasopharyngeal swab, presence of viral mutation(s) within the ?areas targeted by this assay, and inadequate number of viral copies ?(<250 copies / mL). A negative result must be combined with clinical ?observations, patient history, and epidemiological information. ? ?Fact Sheet for Patients:   ?StrictlyIdeas.no ? ?Fact Sheet for Healthcare Providers: ?BankingDealers.co.za ? ?This test is not yet approved or  cleared by the [place] FDA and ?has been authorized for detection and/or diagnosis of SARS-CoV-2 by ?FDA under an Emergency Use Authorization  (EUA).  This EUA will remain ?in effect (meaning this test can be used) for the duration of the ?COVID-19 declaration under Section 564(b)(1) of the Act, 21 U.S.C. ?section 360bbb-3(b)(1), unless the authorization i

## 2021-12-12 NOTE — Progress Notes (Addendum)
Patient ID: Imad Shostak, male   DOB: 06/23/1966, 56 y.o.   MRN: [id number] ?Discussed with patient and wife Intra-Op culture results growing rare staph epi, rare Streptococcus agalactiae and rare Staphylococcus Lugdunensis.    ? ?He has 500 mg of Vanco powder that was placed down deep and then after the fascia was closed had bioabsorbable beads with 1 gm vancomycin and 1.2 g of tobramycin.  Unfortunately with some bloody drainage some of the antibiotics has been leaking out on the dressing. ? ?Plan is for patient to be down in bed with head of bed down he can be on his back stomach or sides but is not getting out of bed until I make a decision on Monday.  Despite tight closure and then multiple vertical mattress sutures and even Dermabond added to the skin he still continues to have some drainage but this appears to be bloody and is not clear but there is still possibly could be some CSF present even though at the time of closure the dura was intact and was not leaking.  And partner will be rounding on him this weekend and I will see him on Monday. ? ?Will recheck CBC,Sed rate and CRP  Saturday AM labs.  ?

## 2021-12-13 LAB — CBC WITH DIFFERENTIAL/PLATELET
Abs Immature Granulocytes: 0.14 10*3/uL — ABNORMAL HIGH (ref 0.00–0.07)
Basophils Absolute: 0.1 10*3/uL (ref 0.0–0.1)
Basophils Relative: 0 %
Eosinophils Absolute: 0.4 10*3/uL (ref 0.0–0.5)
Eosinophils Relative: 4 %
HCT: 34.6 % — ABNORMAL LOW (ref 39.0–52.0)
Hemoglobin: 11.3 g/dL — ABNORMAL LOW (ref 13.0–17.0)
Immature Granulocytes: 1 %
Lymphocytes Relative: 16 %
Lymphs Abs: 1.9 10*3/uL (ref 0.7–4.0)
MCH: 28.4 pg (ref 26.0–34.0)
MCHC: 32.7 g/dL (ref 30.0–36.0)
MCV: 86.9 fL (ref 80.0–100.0)
Monocytes Absolute: 1.1 10*3/uL — ABNORMAL HIGH (ref 0.1–1.0)
Monocytes Relative: 9 %
Neutro Abs: 8.3 10*3/uL — ABNORMAL HIGH (ref 1.7–7.7)
Neutrophils Relative %: 70 %
Platelets: 471 10*3/uL — ABNORMAL HIGH (ref 150–400)
RBC: 3.98 MIL/uL — ABNORMAL LOW (ref 4.22–5.81)
RDW: 15.4 % (ref 11.5–15.5)
WBC: 11.9 10*3/uL — ABNORMAL HIGH (ref 4.0–10.5)
nRBC: 0 % (ref 0.0–0.2)

## 2021-12-13 LAB — SEDIMENTATION RATE: Sed Rate: 74 mm/hr — ABNORMAL HIGH (ref 0–16)

## 2021-12-13 LAB — C-REACTIVE PROTEIN: CRP: 11.4 mg/dL — ABNORMAL HIGH (ref <1.0)

## 2021-12-13 NOTE — Progress Notes (Signed)
Patient ID: Brandon Burton, male   DOB: October 15, 1965, 56 y.o.   MRN: [id number] ? ? ?Subjective: ?3 Days Post-Op Procedure(s) (LRB): ?LUMBAR RE-EXPLORATION (N/A) ?Patient reports pain as mild.   ? ?Objective: ?Vital signs in last 24 hours: ?Temp:  [98.3 ?F (36.8 ?C)-100.5 ?F (38.1 ?C)] 99.3 ?F (37.4 ?C) (03/18 0753) ?Pulse Rate:  [84-108] 86 (03/18 0753) ?Resp:  [15-19] 16 (03/18 0753) ?BP: (131-172)/(69-84) 132/80 (03/18 0319) ?SpO2:  [93 %-100 %] 100 % (03/18 0319) ? ?Intake/Output from previous day: ?03/17 0701 - 03/18 0700 ?In: 628 [I.V.:3; IV MNOTRRNHA:579] ?Out: 2650 [Urine:2650] ?Intake/Output this shift: ?No intake/output data recorded. ? ?Recent Labs  ?  12/13/21 ?0515  ?HGB 11.3*  ? ?Recent Labs  ?  12/13/21 ?0515  ?WBC 11.9*  ?RBC 3.98*  ?HCT 34.6*  ?PLT 471*  ? ?Recent Labs  ?  12/11/21 ?0343  ?NA 136  ?K 3.7  ?CL 102  ?CO2 25  ?BUN 10  ?CREATININE 1.07  ?GLUCOSE 113*  ?CALCIUM 8.4*  ? ?No results for input(s): LABPT, INR in the last 72 hours. ? ?Neurologically intact. UNABLE TO SQUEEZE ANY DRAINAGE FROM INCISION. SPOT ON DRESSING FROM YESTERDAY SIZE OF A DIME.  ? ? ?No results found. ? ?Assessment/Plan: ?3 Days Post-Op Procedure(s) (LRB): ?LUMBAR RE-EXPLORATION (N/A) ?Plan:   CONTINUE FLAT HEAD OF BED DOWN, MAY LOGROLL. DRESSING CHANGE AS NEEDED WITH BETADINE SWAB AND NEW MEPILEX THEN ABD PAD AND TAPE. I WILL SEE HIM Monday AM . CBC. SED RATE AND CRP ALL IMPROVED. CONTINUE vanc PENDING CULTURES WITH ARE RE INCUBATED FOR BETTER GROWTH ? ?[name] ?12/13/2021, 9:32 AM ? ? ? ? ? ?

## 2021-12-14 LAB — BASIC METABOLIC PANEL
Anion gap: 10 (ref 5–15)
BUN: 11 mg/dL (ref 6–20)
CO2: 21 mmol/L — ABNORMAL LOW (ref 22–32)
Calcium: 8.5 mg/dL — ABNORMAL LOW (ref 8.9–10.3)
Chloride: 103 mmol/L (ref 98–111)
Creatinine, Ser: 0.9 mg/dL (ref 0.61–1.24)
GFR, Estimated: >60 mL/min (ref >60)
Glucose, Bld: 112 mg/dL — ABNORMAL HIGH (ref 70–99)
Potassium: 4.3 mmol/L (ref 3.5–5.1)
Sodium: 134 mmol/L — ABNORMAL LOW (ref 135–145)

## 2021-12-14 LAB — VANCOMYCIN, PEAK: Vancomycin Pk: 15 ug/mL — ABNORMAL LOW (ref 30–40)

## 2021-12-14 NOTE — Plan of Care (Signed)
?  Problem: Education: ?Goal: Knowledge of General Education information will improve ?Description: Including pain rating scale, medication(s)/side effects and non-pharmacologic comfort measures ?Outcome: Progressing ?  ?Problem: Health Behavior/Discharge Planning: ?Goal: Ability to manage health-related needs will improve ?Outcome: Progressing ?  ?Problem: Clinical Measurements: ?Goal: Ability to maintain clinical measurements within normal limits will improve ?Outcome: Progressing ?Goal: Will remain free from infection ?Outcome: Not Progressing ?  ?

## 2021-12-14 NOTE — Progress Notes (Signed)
? ?  Subjective: ? ?No acute events overnight.  Resting comfortably this AM.  Pain well controlled.  ? ?Objective:  ? ?VITALS:   ?Vitals:  ? 12/14/21 0012 12/14/21 0210 12/14/21 5003 12/14/21 0856  ?BP: 128/78  136/79 134/86  ?Pulse: 97   (!) 106  ?Resp:   16   ?Temp: (!) 101.2 ?F (38.4 ?C) 99.5 ?F (37.5 ?C) 98.3 ?F (36.8 ?C) 99.1 ?F (37.3 ?C)  ?TempSrc: Oral Oral Oral Oral  ?SpO2: 100%  99% 100%  ?Weight:      ?Height:      ? ? ?Gen: NAD, resting comfortably on side ?Pulm: Normal WOB on RA ?CV: BLE warm and well perfused ?Back: Incision clean and dry, no drainage on dressing.  ? ? ? ?Lab Results  ?Component Value Date  ? WBC 11.9 (H) 12/13/2021  ? HGB 11.3 (L) 12/13/2021  ? HCT 34.6 (L) 12/13/2021  ? MCV 86.9 12/13/2021  ? PLT 471 (H) 12/13/2021  ? ? ? ?Assessment/Plan: ? ?56 yo M 4 Days Post-Op s/p lumbar surgical site exploration with repair of dural tear.  Doing well postoperatively.  ? ?Pt to remain HOB down w/ logroll ?Intraop cx w/ rare Staph an Strep spp ?Change dressing PRN per Dr. Lorin Mercy' previous note ?Dr. Lorin Mercy to see again in AM ? ?Sherilyn Cooter, MD ?12/14/2021, 11:06 AM ?[phone] ? ?

## 2021-12-14 NOTE — Progress Notes (Signed)
Id brief note ? ? ?I called micro today ?Clarified that the staph epididermis was seen and reported previously, but has since been blocked ? ? ?Given the dural tear, I would like to cover for the staph epi as well in setting of concern it could have penetrated the csf. ?There was no hardware mentioned ? ? ? ?A/p ?Surgical site infection/deep space ?Dural tear  ? ?S/p I&D and dural tear repair 3/14 ? ?-f/u staph epi culture ?-continue vancomycin ? ?-I think in setting of dural tear, the best oral antibiotics would be linezolid for 2 weeks (inculding vancomycin days already given) ?-would keep till tomorrow. Linezolid coverage could be an issue and our ID pharmacy team can assist with this tomorrow during week days ? ? ? ?Dr West Bali will continue ID care for this patient tomorrow ?

## 2021-12-15 ENCOUNTER — Other Ambulatory Visit (HOSPITAL_COMMUNITY): Payer: Self-pay

## 2021-12-15 DIAGNOSIS — G9782 Other postprocedural complications and disorders of nervous system: Secondary | ICD-10-CM | POA: Diagnosis not present

## 2021-12-15 DIAGNOSIS — G9600 Cerebrospinal fluid leak, unspecified: Secondary | ICD-10-CM | POA: Diagnosis not present

## 2021-12-15 LAB — AEROBIC/ANAEROBIC CULTURE W GRAM STAIN (SURGICAL/DEEP WOUND)

## 2021-12-15 LAB — VANCOMYCIN, TROUGH: Vancomycin Tr: 7 ug/mL — ABNORMAL LOW (ref 15–20)

## 2021-12-15 MED ORDER — VANCOMYCIN HCL 2000 MG/400ML IV SOLN
2000.0000 mg | Freq: Two times a day (BID) | INTRAVENOUS | Status: DC
  Administered 2021-12-15 – 2021-12-17 (×4): 2000 mg via INTRAVENOUS
  Filled 2021-12-15 (×5): qty 400

## 2021-12-15 MED ORDER — VANCOMYCIN HCL 1750 MG/350ML IV SOLN
1750.0000 mg | Freq: Two times a day (BID) | INTRAVENOUS | Status: DC
  Filled 2021-12-15: qty 350

## 2021-12-15 MED ORDER — LINEZOLID 600 MG PO TABS
600.0000 mg | ORAL_TABLET | Freq: Two times a day (BID) | ORAL | Status: DC
  Filled 2021-12-15: qty 1

## 2021-12-15 NOTE — Progress Notes (Addendum)
Pharmacy Antibiotic Note ? ?Brandon Burton is a 56 y.o. male admitted on 12/09/2021 with  postop CSF leak after 3/1 spinal decompression .  Pharmacy has been consulted for vancomycin dosing. ? ?WBC 17.5 >> 11.9. Peak returned at 15, trough returned at 7. AUC subtherapeutic calculated at 270. ? ?Plan: ?Increase Vancomycin to 1750 mg IV Q 12 hrs. Goal AUC 400-550. ?Expected AUC: 475 ?SCr used: 0.9 ? ? ?Height: '5\' 8"'$  (172.7 cm) ?Weight: 114.8 kg (253 lb) ?IBW/kg (Calculated) : 68.4 ? ?Temp (24hrs), Avg:100 ?F (37.8 ?C), Min:98.9 ?F (37.2 ?C), Max:102.4 ?F (39.1 ?C) ? ?Recent Labs  ?Lab 12/09/21 ?1320 12/11/21 ?0343 12/13/21 ?0349 12/14/21 ?1791 12/14/21 ?1941 12/15/21 ?0159  ?WBC 17.5*  --  11.9*  --   --   --   ?CREATININE 1.10 1.07  --  0.90  --   --   ?[place]  --   --   --   --   --  7*  ?VANCOPEAK  --   --   --   --  15*  --   ?  ?Estimated Creatinine Clearance: 112.8 mL/min (by C-G formula based on SCr of 0.9 mg/dL).   ? ?No Known Allergies ? ?Antimicrobials this admission: ?Vancomycin 3/17 >>  ?Cefazolin 3/14 >> 3/17 ? ?Microbiology results: ?3/14 Wound Cx: strep agalactiae ?3/15 Lumbar Cx - rare staph epi, strep agalactiae, staph lugdunensis - reincubated  ? ?Thank you for allowing pharmacy to be a part of this patient?s care. ? ?Carma Lair, PharmD Candidate [phone] ?12/15/2021 5:02 AM ? ?

## 2021-12-15 NOTE — Progress Notes (Signed)
? ?[facility] Infectious Diseases Follow Up Note ? ?Patient Identification: ?Patient Name: Brandon Burton MRN: [id number] [place] Date: 12/09/2021 12:57 PM ?Age: 56 y.o.Today's Date: 12/15/2021 ? ? ?Reason for Visit: surgical site infection  ? ?Principal Problem: ?  Postoperative CSF leak ?Active Problems: ?  Status post lumbar spine surgery for decompression of spinal cord ?  Wound drainage ? ? ?Antibiotics:  ?Cefazolin 3/14-3/16 ?Vancomycin 3/14-c ? ?Lines/Hardwares:  ? ?Interval Events: Febrile 102.4 last evening but afebrile since then. No CBC available ? ? ?Assessment ?# Lumbar surgical site infection/deep to dural space with dural leak s/p  Lumbar exploration L3-L5 decompression with dural repair 3/15. OR cx ( MRSE, MS staph lugdunensis, strep agalactiae) ?- 11/26/21:  L3-4, L4-5 decompression.  Near complete laminectomy L3, completed L4, complete laminectomy L5.  Repair of 3 mm Intra-Op durotomy with primary repair sutures.  Right L3-4 microdiscectomy removal of extruded fragment ? ?# Fevers ?- initially low garde and now up trending to 101 then 102 today ?- no prior blood cx available  ? ?# Medication Monitoring - Vancomycin trough 3/20 7 ? ?Recommendations ?Staph epidermidis is MRSE and will continue IV vancomycin  ?Will get 2 sets of blood cultures today to r/o bacteremia given persistent fevers ?Will need to adjust vancomycin dose given subtherapeutic troughs ?Monitor CBC ?Post op care per Neurosurgery ?Following ? ?Rest of the management as per the primary team. ?Thank you for the consult. Please page with pertinent questions or concerns. ? ?______________________________________________________________________ ?Subjective ?patient seen and examined at the bedside. ?Lying in the bed with back flat  ?Wife at bedside.  ?Eager to go home  ? ? ?Vitals ?BP 135/80 (BP Location: Right Arm)   Pulse 93   Temp 98.9 ?F (37.2 ?C)   Resp 20   Ht '5\' 8"'$  (1.727 m)   Wt  114.8 kg   SpO2 100%   BMI 38.47 kg/m?  ? ?  ?Physical Exam ?Constitutional:  lying in bed, not in acute distress ?   Comments:  ? ?Cardiovascular:  ?   Rate and Rhythm: Normal rate and regular rhythm.  ?   Heart sounds:  ? ?Pulmonary:  ?   Effort: Pulmonary effort is normal.  ?   Comments: clear lung sounds b/l ? ?Abdominal:  ?   Palpations: Abdomen is soft.  ?   Tenderness: non distended and non tender  ? ?Musculoskeletal:     ?   General: No swelling or tenderness.  ? ?Skin: ?   Comments: back wound seen in photo on wife's phone ( looks non infected ) ? ?Neurological:  ?   General: awake, alert and oriented, spontaneously moves all extremities  ? ?Psychiatric:     ?   Mood and Affect: Mood normal.  ? ? ?Pertinent Microbiology ?Results for orders placed or performed during the hospital encounter of 12/09/21  ?Surgical PCR screen     Status: None  ? Collection Time: 12/09/21  1:30 PM  ? Specimen: Nasal Mucosa; Nasal Swab  ?Result Value Ref Range Status  ? MRSA, PCR NEGATIVE NEGATIVE Final  ? Staphylococcus aureus NEGATIVE NEGATIVE Final  ?  Comment: (NOTE) ?The Xpert SA Assay (FDA approved for NASAL specimens in patients 58 ?years of age and older), is one component of a comprehensive ?surveillance program. It is not intended to diagnose infection nor to ?guide or monitor treatment. ?Performed at [facility], [place] [address] [place], [place] ?[address] ?  ?SARS Coronavirus 2 by RT PCR (hospital order, performed in  [facility] hospital lab) Nasopharyngeal Nasopharyngeal Swab     Status: None  ? Collection Time: 12/10/21  8:00 AM  ? Specimen: Nasopharyngeal Swab  ?Result Value Ref Range Status  ? SARS Coronavirus 2 NEGATIVE NEGATIVE Final  ?  Comment: (NOTE) ?SARS-CoV-2 target nucleic acids are NOT DETECTED. ? ?The SARS-CoV-2 RNA is generally detectable in upper and lower ?respiratory specimens during the acute phase of infection. The lowest ?concentration of SARS-CoV-2 viral copies this assay can detect is  250 ?copies / mL. A negative result does not preclude SARS-CoV-2 infection ?and should not be used as the sole basis for treatment or other ?patient management decisions.  A negative result may occur with ?improper specimen collection / handling, submission of specimen other ?than nasopharyngeal swab, presence of viral mutation(s) within the ?areas targeted by this assay, and inadequate number of viral copies ?(<250 copies / mL). A negative result must be combined with clinical ?observations, patient history, and epidemiological information. ? ?Fact Sheet for Patients:   ?StrictlyIdeas.no ? ?Fact Sheet for Healthcare Providers: ?BankingDealers.co.za ? ?This test is not yet approved or  cleared by the [place] FDA and ?has been authorized for detection and/or diagnosis of SARS-CoV-2 by ?FDA under an Emergency Use Authorization (EUA).  This EUA will remain ?in effect (meaning this test can be used) for the duration of the ?COVID-19 declaration under Section 564(b)(1) of the Act, 21 U.S.C. ?section 360bbb-3(b)(1), unless the authorization is terminated or ?revoked sooner. ? ?Performed at [facility], [place] [address], [place], [place] ?[address] ?  ?Aerobic/Anaerobic Culture w Gram Stain (surgical/deep wound)     Status: None (Preliminary result)  ? Collection Time: 12/10/21  4:34 PM  ? Specimen: Soft Tissue, Other  ?Result Value Ref Range Status  ? Specimen Description WOUND  Final  ? Special Requests LUMBAR CULTURE  Final  ? Gram Stain   Final  ?  RARE WBC PRESENT, PREDOMINANTLY PMN ?NO ORGANISMS SEEN ?  ? Culture   Final  ?  RARE STAPHYLOCOCCUS EPIDERMIDIS ?RARE STAPHYLOCOCCUS LUGDUNENSIS ?RARE GROUP B STREP(S.AGALACTIAE)ISOLATED ?TESTING AGAINST S. AGALACTIAE NOT ROUTINELY PERFORMED DUE TO PREDICTABILITY OF AMP/PEN/VAN SUSCEPTIBILITY. ?SUSCEPTIBILITIES TO FOLLOW FOR STAPH EPI ?Performed at [facility], [place] [address], [place], [place] [address] ?  ?  Report Status PENDING  Incomplete  ? Organism ID, Bacteria STAPHYLOCOCCUS LUGDUNENSIS  Final  ?    Susceptibility  ? Staphylococcus lugdunensis - MIC*  ?  CIPROFLOXACIN <=0.5 SENSITIVE Sensitive   ?  ERYTHROMYCIN <=0.25 SENSITIVE Sensitive   ?  GENTAMICIN <=0.5 SENSITIVE Sensitive   ?  OXACILLIN 1 SENSITIVE Sensitive   ?  TETRACYCLINE <=1 SENSITIVE Sensitive   ?  VANCOMYCIN <=0.5 SENSITIVE Sensitive   ?  TRIMETH/SULFA <=10 SENSITIVE Sensitive   ?  CLINDAMYCIN <=0.25 SENSITIVE Sensitive   ?  RIFAMPIN <=0.5 SENSITIVE Sensitive   ?  Inducible Clindamycin NEGATIVE Sensitive   ?  * RARE STAPHYLOCOCCUS LUGDUNENSIS  ? ? ? ?Pertinent Lab. ?CBC Latest Ref Rng & Units 12/13/2021 12/09/2021 11/24/2021  ?WBC 4.0 - 10.5 K/uL 11.9(H) 17.5(H) 8.1  ?Hemoglobin 13.0 - 17.0 g/dL 11.3(L) 11.5(L) 13.7  ?Hematocrit 39.0 - 52.0 % 34.6(L) 35.1(L) 42.6  ?Platelets 150 - 400 K/uL 471(H) 643(H) 492(H)  ? ?CMP Latest Ref Rng & Units 12/14/2021 12/11/2021 12/09/2021  ?Glucose 70 - 99 mg/dL 112(H) 113(H) 120(H)  ?BUN 6 - 20 mg/dL '11 10 13  '$ ?Creatinine 0.61 - 1.24 mg/dL 0.90 1.07 1.10  ?Sodium 135 - 145 mmol/L  134(L) 136 137  ?Potassium 3.5 - 5.1 mmol/L 4.3 3.7 3.6  ?Chloride 98 - 111 mmol/L 103 102 103  ?CO2 22 - 32 mmol/L 21(L) 25 25  ?Calcium 8.9 - 10.3 mg/dL 8.5(L) 8.4(L) 8.9  ?Total Protein 6.5 - 8.1 g/dL - - 7.4  ?Total Bilirubin 0.3 - 1.2 mg/dL - - 0.6  ?Alkaline Phos 38 - 126 U/L - - 86  ?AST 15 - 41 U/L - - 29  ?ALT 0 - 44 U/L - - 31  ? ? ? ?Pertinent Imaging today ?Plain films and CT images have been personally visualized and interpreted; radiology reports have been reviewed. Decision making incorporated into the Impression / Recommendations. ?DG Lumbar Spine 2-3 Views ? ?Result Date: 11/26/2021 ?CLINICAL DATA:  Cross-table lateral view of lumbar spine for intraoperative localization EXAM: LUMBAR SPINE - 2-3 VIEW COMPARISON:  None. FINDINGS: Portable cross-table lateral views of lumbar spine were done for intraoperative localization.  IMPRESSION: Portable lateral views of lumbar spine with done for intraoperative localization. Electronically Signed   By: Elmer Picker M.D.   On: 11/26/2021 16:16   ? ? ?I spent more than 35 minutes for this patie

## 2021-12-15 NOTE — Progress Notes (Signed)
Per Dr. Lorin Mercy pt sitting up on side of bed removed foley at this time. Pt tolerated well. Due to void by 2120.  ?

## 2021-12-15 NOTE — Progress Notes (Signed)
Pharmacy Antibiotic Note ? ?Brandon Burton is a 56 y.o. male admitted on 12/09/2021 with  postop CSF leak after 3/1 spinal decompression .  Pharmacy has been consulted for vancomycin dosing. ? ?WBC 17.5 >> 11.9. Peak returned at 15, trough returned at 7. AUC subtherapeutic calculated at 270. Noted that patient is still having fevers with most recent Tmax 102. ID is sending blood cultures today. Since concern for possible CSF involvement will switch to a trough based dosing approach.  ? ?Plan: ?Increase Vancomycin to 2000 mg every 12 hours ?Will collect VT at steady state if continued ?Noted plans to switch to po linezolid for stepdown therapy which will be a 10 dollar copay  ?Monitor renal function  ? ? ?Height: '5\' 8"'$  (172.7 cm) ?Weight: 114.8 kg (253 lb) ?IBW/kg (Calculated) : 68.4 ? ?Temp (24hrs), Avg:100.2 ?F (37.9 ?C), Min:98.9 ?F (37.2 ?C), Max:102.4 ?F (39.1 ?C) ? ?Recent Labs  ?Lab 12/09/21 ?1320 12/11/21 ?0343 12/13/21 ?7062 12/14/21 ?3762 12/14/21 ?1941 12/15/21 ?0159  ?WBC 17.5*  --  11.9*  --   --   --   ?CREATININE 1.10 1.07  --  0.90  --   --   ?[place]  --   --   --   --   --  7*  ?VANCOPEAK  --   --   --   --  15*  --   ? ?  ?Estimated Creatinine Clearance: 112.8 mL/min (by C-G formula based on SCr of 0.9 mg/dL).   ? ?No Known Allergies ? ?Antimicrobials this admission: ?Vancomycin 3/17 >>  ?Cefazolin 3/14 >> 3/17 ? ?Microbiology results: ?3/14 Wound Cx: strep agalactiae ?3/15 Lumbar Cx - rare staph epi, strep agalactiae, staph lugdunensis  ? ?Thank you for allowing pharmacy to be a part of this patient?s care. ? ?Jimmy Footman, PharmD, BCPS, BCIDP ?Infectious Diseases Clinical Pharmacist ?Phone: [phone] ?12/15/2021 10:17 AM ? ?

## 2021-12-15 NOTE — TOC Benefit Eligibility Note (Signed)
Patient Advocate Encounter ? ?Insurance verification completed.   ? ?The patient is currently admitted and upon discharge could be taking linezolid (Zyvox) tablets. ? ?The current 14 day co-pay is, $10.00.  ? ?The patient is insured through [employer]  ? ? ? ?Lyndel Safe, CPhT ?Pharmacy Patient Advocate Specialist ?[place] Patient Advocate Team ?Direct Number: [phone]  Fax: [phone] ? ? ? ? ? ?  ?

## 2021-12-15 NOTE — Progress Notes (Addendum)
Patient ID: Brandon Burton, male   DOB: [birth year], 56 y.o.   MRN: [id number] ? ? ?Subjective: ?5 Days Post-Op Procedure(s) (LRB): ?LUMBAR RE-EXPLORATION (N/A) ?Patient reports pain as mild.   ? ?Objective: ?Vital signs in last 24 hours: ?Temp:  [98.9 ?F (37.2 ?C)-102.4 ?F (39.1 ?C)] 99.4 ?F (37.4 ?C) (03/20 1127) ?Pulse Rate:  [93-107] 102 (03/20 1127) ?Resp:  [20] 20 (03/19 1929) ?BP: (109-152)/(73-95) 152/95 (03/20 1127) ?SpO2:  [96 %-100 %] 96 % (03/20 1127) ? ?Intake/Output from previous day: ?03/19 0701 - 03/20 0700 ?In: -  ?Out: 750 [Urine:750] ?Intake/Output this shift: ?No intake/output data recorded. ? ?Recent Labs  ?  12/13/21 ?0515  ?HGB 11.3*  ? ?Recent Labs  ?  12/13/21 ?0515  ?WBC 11.9*  ?RBC 3.98*  ?HCT 34.6*  ?PLT 471*  ? ?Recent Labs  ?  12/14/21 ?0616  ?NA 134*  ?K 4.3  ?CL 103  ?CO2 21*  ?BUN 11  ?CREATININE 0.90  ?GLUCOSE 112*  ?CALCIUM 8.5*  ? ?No results for input(s): LABPT, INR in the last 72 hours. ? ?Neurologically intact ?No results found. ? ?Assessment/Plan: ?5 Days Post-Op Procedure(s) (LRB): ?LUMBAR RE-EXPLORATION (N/A) ?Up with therapy if able to sit up without increased drainage . Dressing from 2 days ago shows quarter size spot looks like ice tea collar . No fluid able to be squeezed from incision. Will see how he does sitting up and then slowly mobilize . If increased drainage then will have to stop progression plans on mobility.  ? ?Concern is MRSE growing and also temp last nite 730 PM of 102.4. blood cultures ordered by ID team.  ? ?[name] ?12/15/2021, 3:03 PM ? ? ? ? ? ?

## 2021-12-16 ENCOUNTER — Inpatient Hospital Stay: Payer: Self-pay

## 2021-12-16 ENCOUNTER — Inpatient Hospital Stay (HOSPITAL_COMMUNITY): Payer: BC Managed Care – PPO

## 2021-12-16 DIAGNOSIS — G9600 Cerebrospinal fluid leak, unspecified: Secondary | ICD-10-CM | POA: Diagnosis not present

## 2021-12-16 DIAGNOSIS — G9782 Other postprocedural complications and disorders of nervous system: Secondary | ICD-10-CM | POA: Diagnosis not present

## 2021-12-16 DIAGNOSIS — R609 Edema, unspecified: Secondary | ICD-10-CM

## 2021-12-16 LAB — BASIC METABOLIC PANEL
Anion gap: 10 (ref 5–15)
BUN: 15 mg/dL (ref 6–20)
CO2: 22 mmol/L (ref 22–32)
Calcium: 8.5 mg/dL — ABNORMAL LOW (ref 8.9–10.3)
Chloride: 102 mmol/L (ref 98–111)
Creatinine, Ser: 0.96 mg/dL (ref 0.61–1.24)
GFR, Estimated: >60 mL/min (ref >60)
Glucose, Bld: 114 mg/dL — ABNORMAL HIGH (ref 70–99)
Potassium: 4.1 mmol/L (ref 3.5–5.1)
Sodium: 134 mmol/L — ABNORMAL LOW (ref 135–145)

## 2021-12-16 LAB — D-DIMER, QUANTITATIVE: D-Dimer, Quant: 6.91 ug/mL-FEU — ABNORMAL HIGH (ref 0.00–0.50)

## 2021-12-16 MED ORDER — ASPIRIN 325 MG PO TABS
325.0000 mg | ORAL_TABLET | Freq: Every day | ORAL | Status: DC
  Administered 2021-12-16 – 2021-12-17 (×2): 325 mg via ORAL
  Filled 2021-12-16 (×2): qty 1

## 2021-12-16 MED ORDER — RIVAROXABAN 20 MG PO TABS: 20.0000 mg | ORAL_TABLET | Freq: Every day | ORAL | Status: DC

## 2021-12-16 MED ORDER — RIVAROXABAN 15 MG PO TABS
15.0000 mg | ORAL_TABLET | Freq: Two times a day (BID) | ORAL | Status: DC
  Administered 2021-12-16 – 2021-12-17 (×2): 15 mg via ORAL
  Filled 2021-12-16 (×2): qty 1

## 2021-12-16 NOTE — Progress Notes (Signed)
Patient ID: Brandon Burton, male   DOB: 03-Apr-1966, 56 y.o.   MRN: [id number] ?D -dimer elevated, will get venous dopplers. No LE swelling but has been bedrest times 5 plus days ?

## 2021-12-16 NOTE — Progress Notes (Signed)
Informed by vascular tech pt preliminary findings of venous duplex show DVT in RLE. Dr. Lorin Mercy informed. Awaiting response.  ?

## 2021-12-16 NOTE — Progress Notes (Signed)
Patient ID: Brandon Burton, male   DOB: [birth year], 56 y.o.   MRN: [id number] ? ? ?Subjective: ?6 Days Post-Op Procedure(s) (LRB): ?LUMBAR RE-EXPLORATION (N/A) ?Patient reports pain as mild.   ? ?Objective: ?Vital signs in last 24 hours: ?Temp:  [98.7 ?F (37.1 ?C)-100.9 ?F (38.3 ?C)] 99.4 ?F (37.4 ?C) (03/21 0331) ?Pulse Rate:  [96-108] 96 (03/21 0331) ?Resp:  [16-18] 18 (03/21 0331) ?BP: (129-152)/(76-95) 130/88 (03/21 0331) ?SpO2:  [92 %-99 %] 99 % (03/21 0331) ? ?Intake/Output from previous day: ?03/20 0701 - 03/21 0700 ?In: 2000 [P.O.:1200; IV Piggyback:800] ?Out: 1250 [Urine:1250] ?Intake/Output this shift: ?No intake/output data recorded. ? ?No results for input(s): HGB in the last 72 hours. ?No results for input(s): WBC, RBC, HCT, PLT in the last 72 hours. ?Recent Labs  ?  12/14/21 ?0616 12/16/21 ?6712  ?NA 134* 134*  ?K 4.3 4.1  ?CL 103 102  ?CO2 21* 22  ?BUN 11 15  ?CREATININE 0.90 0.96  ?GLUCOSE 112* 114*  ?CALCIUM 8.5* 8.5*  ? ?No results for input(s): LABPT, INR in the last 72 hours. ? ?Neurologically intact ?No results found. ? ?Assessment/Plan: ?6 Days Post-Op Procedure(s) (LRB): ?LUMBAR RE-EXPLORATION (N/A) ?Up with therapy, PICC line ordered.  ? ?Brandon Burton ?12/16/2021, 7:45 AM ? ? ? ? ? ?

## 2021-12-16 NOTE — Progress Notes (Signed)
PT Cancellation Note ? ?Patient Details ?Name: Brandon Burton ?MRN: [id number] ?DOB: 03/28/1966 ? ? ?Cancelled Treatment:    Reason Eval/Treat Not Completed: Medical issues which prohibited therapy - awaiting LE dopplar to rule out DVT, will check back.  ? ?Stacie Glaze, PT DPT ?Acute Rehabilitation Services ?Pager [phone]  ?Office [phone] ? ? ? ?Malissia Rabbani E Stroup ?12/16/2021, 3:52 PM ?

## 2021-12-16 NOTE — Progress Notes (Signed)
BLE venous duplex has been completed.  Preliminary findings given to [employer], Therapist, sports. ? ? ?Results can be found under chart review under CV PROC. ?12/16/2021 4:53 PM ?Joyann Spidle RVT, RDMS ? ? ?

## 2021-12-16 NOTE — Progress Notes (Addendum)
Patient ID: Brandon Burton, male   DOB: 1966-03-09, 56 y.o.   MRN: [id number] ? ? ?Subjective: ?6 Days Post-Op Procedure(s) (LRB): ?LUMBAR RE-EXPLORATION (N/A) ?Patient reports pain as mild.   ? ?Objective: ?Vital signs in last 24 hours: ?Temp:  [98.7 ?F (37.1 ?C)-100.9 ?F (38.3 ?C)] 99.4 ?F (37.4 ?C) (03/21 0331) ?Pulse Rate:  [96-108] 96 (03/21 0331) ?Resp:  [16-18] 18 (03/21 0331) ?BP: (129-152)/(76-95) 130/88 (03/21 0331) ?SpO2:  [92 %-99 %] 99 % (03/21 0331) ? ?Intake/Output from previous day: ?03/20 0701 - 03/21 0700 ?In: 2000 [P.O.:1200; IV Piggyback:800] ?Out: 1250 [Urine:1250] ?Intake/Output this shift: ?No intake/output data recorded. ? ?No results for input(s): HGB in the last 72 hours. ?No results for input(s): WBC, RBC, HCT, PLT in the last 72 hours. ?Recent Labs  ?  12/14/21 ?0616 12/16/21 ?2951  ?NA 134* 134*  ?K 4.3 4.1  ?CL 103 102  ?CO2 21* 22  ?BUN 11 15  ?CREATININE 0.90 0.96  ?GLUCOSE 112* 114*  ?CALCIUM 8.5* 8.5*  ? ?No results for input(s): LABPT, INR in the last 72 hours. ? ?Neurologically intact ?[place] EKG SITE RITE ? ?Result Date: 12/16/2021 ?If [employer] not attached, placement could not be confirmed due to current cardiac rhythm.  ? ?Assessment/Plan: ?6 Days Post-Op Procedure(s) (LRB): ?LUMBAR RE-EXPLORATION (N/A) ?Up with therapy, PICC line. Final ABX recommendations from ID, authorization for HHIV ABX and RN visits  ? ?Brandon Burton ?12/16/2021, 7:52 AM ? ? ?Addendum:   ID team said PICC line not needed. Linezolid planned. They will have to give dose and length of Tx .  ? ? ?

## 2021-12-16 NOTE — Progress Notes (Signed)
? ?[facility] Infectious Diseases Follow Up Note ? ?Patient Identification: ?Patient Name: Brandon Burton MRN: [id number] Joanna Date: 12/09/2021 12:57 PM ?Age: 56 y.o.Today's Date: 12/16/2021 ? ? ?Reason for Visit: surgical site infection  ? ?Principal Problem: ?  Postoperative CSF leak ?Active Problems: ?  Status post lumbar spine surgery for decompression of spinal cord ?  Wound drainage ? ? ?Antibiotics:  ?Cefazolin 3/14-3/16 ?Vancomycin 3/14-c ? ?Lines/Hardwares:  ? ?Interval Events: T max 100.9 last evening. Up with therapy  ? ? ?Assessment ?# Lumbar surgical site infection/deep to dural space with dural leak s/p  Lumbar exploration L3-L5 decompression with dural repair 3/15. OR cx ( MRSE, MS staph lugdunensis, strep agalactiae) ?- 11/26/21:  L3-4, L4-5 decompression.  Near complete laminectomy L3, completed L4, complete laminectomy L5.  Repair of 3 mm Intra-Op durotomy with primary repair sutures.  Right L3-4 microdiscectomy removal of extruded fragment ? ?# Fevers ?- fever curve is downtrending, No new concerns otherwise ? ?# Medication Monitoring - Vancomycin trough 3/20 7 ? ?Recommendations ?Continue IV vancomycin while inpatient ?If blood cx negative in 48 hrs, will switch IV Vancomycin to PO lInezolid for discharge ?Duration 14 days total from 3/15 ( IV Vancomcyin and PO Linezolid) ?Fu blood cultures ?Follow up at [facility] with myself on 3/28 at 9 am  ? ?Rest of the management as per the primary team. ?Thank you for the consult. Please page with pertinent questions or concerns. ? ?______________________________________________________________________ ?Subjective ?patient seen and examined at the bedside. ?Lying in the bed with back flat  ?Wife at bedside.  ?Eager to go home  ? ? ?Vitals ?BP 130/88 (BP Location: Left Arm)   Pulse 96   Temp 99.4 ?F (37.4 ?C) (Oral)   Resp 18   Ht '5\' 8"'$  (1.727 m)   Wt 114.8 kg   SpO2 99%   BMI 38.47 kg/m?  ? ?  ?Physical  Exam ?Constitutional:  lying in bed, not in acute distress ?   Comments:  ? ?Cardiovascular:  ?   Rate and Rhythm: Normal rate and regular rhythm.  ?   Heart sounds:  ? ?Pulmonary:  ?   Effort: Pulmonary effort is normal.  ?   Comments: clear lung sounds b/l ? ?Abdominal:  ?   Palpations: Abdomen is soft.  ?   Tenderness: non distended and non tender  ? ?Musculoskeletal:     ?   General: No swelling or tenderness.  ? ?Skin: ?   Comments: back wound with bandage and no strikethrough ? ?Neurological:  ?   General: awake, alert and oriented, spontaneously moves all extremities  ? ?Psychiatric:     ?   Mood and Affect: Mood normal.  ? ? ?Pertinent Microbiology ?Results for orders placed or performed during the hospital encounter of 12/09/21  ?Surgical PCR screen     Status: None  ? Collection Time: 12/09/21  1:30 PM  ? Specimen: Nasal Mucosa; Nasal Swab  ?Result Value Ref Range Status  ? MRSA, PCR NEGATIVE NEGATIVE Final  ? Staphylococcus aureus NEGATIVE NEGATIVE Final  ?  Comment: (NOTE) ?The Xpert SA Assay (FDA approved for NASAL specimens in patients 8 ?years of age and older), is one component of a comprehensive ?surveillance program. It is not intended to diagnose infection nor to ?guide or monitor treatment. ?Performed at [facility], [place] [address], [place], [place] ?[address] ?  ?SARS Coronavirus 2 by RT PCR (hospital order, performed in [facility] hospital lab) Nasopharyngeal Nasopharyngeal Swab  Status: None  ? Collection Time: 12/10/21  8:00 AM  ? Specimen: Nasopharyngeal Swab  ?Result Value Ref Range Status  ? SARS Coronavirus 2 NEGATIVE NEGATIVE Final  ?  Comment: (NOTE) ?SARS-CoV-2 target nucleic acids are NOT DETECTED. ? ?The SARS-CoV-2 RNA is generally detectable in upper and lower ?respiratory specimens during the acute phase of infection. The lowest ?concentration of SARS-CoV-2 viral copies this assay can detect is 250 ?copies / mL. A negative result does not preclude SARS-CoV-2  infection ?and should not be used as the sole basis for treatment or other ?patient management decisions.  A negative result may occur with ?improper specimen collection / handling, submission of specimen other ?than nasopharyngeal swab, presence of viral mutation(s) within the ?areas targeted by this assay, and inadequate number of viral copies ?(<250 copies / mL). A negative result must be combined with clinical ?observations, patient history, and epidemiological information. ? ?Fact Sheet for Patients:   ?StrictlyIdeas.no ? ?Fact Sheet for Healthcare Providers: ?BankingDealers.co.za ? ?This test is not yet approved or  cleared by the [place] FDA and ?has been authorized for detection and/or diagnosis of SARS-CoV-2 by ?FDA under an Emergency Use Authorization (EUA).  This EUA will remain ?in effect (meaning this test can be used) for the duration of the ?COVID-19 declaration under Section 564(b)(1) of the Act, 21 U.S.C. ?section 360bbb-3(b)(1), unless the authorization is terminated or ?revoked sooner. ? ?Performed at [facility], [place] [address], [place], [place] ?[address] ?  ?Aerobic/Anaerobic Culture w Gram Stain (surgical/deep wound)     Status: None  ? Collection Time: 12/10/21  4:34 PM  ? Specimen: Soft Tissue, Other  ?Result Value Ref Range Status  ? Specimen Description WOUND  Final  ? Special Requests LUMBAR CULTURE  Final  ? Gram Stain   Final  ?  RARE WBC PRESENT, PREDOMINANTLY PMN ?NO ORGANISMS SEEN ?  ? Culture   Final  ?  RARE STAPHYLOCOCCUS EPIDERMIDIS ?RARE STAPHYLOCOCCUS LUGDUNENSIS ?RARE GROUP B STREP(S.AGALACTIAE)ISOLATED ?TESTING AGAINST S. AGALACTIAE NOT ROUTINELY PERFORMED DUE TO PREDICTABILITY OF AMP/PEN/VAN SUSCEPTIBILITY. ?NO ANAEROBES ISOLATED ?Performed at [facility], [place] [address], [place], [place] [address] ?  ? Report Status 12/15/2021 FINAL  Final  ? Organism ID, Bacteria STAPHYLOCOCCUS LUGDUNENSIS  Final  ? Organism  ID, Bacteria STAPHYLOCOCCUS EPIDERMIDIS  Final  ?    Susceptibility  ? Staphylococcus epidermidis - MIC*  ?  CIPROFLOXACIN <=0.5 SENSITIVE Sensitive   ?  ERYTHROMYCIN >=8 RESISTANT Resistant   ?  GENTAMICIN <=0.5 SENSITIVE Sensitive   ?  OXACILLIN >=4 RESISTANT Resistant   ?  TETRACYCLINE >=16 RESISTANT Resistant   ?  VANCOMYCIN 2 SENSITIVE Sensitive   ?  TRIMETH/SULFA <=10 SENSITIVE Sensitive   ?  CLINDAMYCIN <=0.25 SENSITIVE Sensitive   ?  RIFAMPIN <=0.5 SENSITIVE Sensitive   ?  Inducible Clindamycin NEGATIVE Sensitive   ?  * RARE STAPHYLOCOCCUS EPIDERMIDIS  ? Staphylococcus lugdunensis - MIC*  ?  CIPROFLOXACIN <=0.5 SENSITIVE Sensitive   ?  ERYTHROMYCIN <=0.25 SENSITIVE Sensitive   ?  GENTAMICIN <=0.5 SENSITIVE Sensitive   ?  OXACILLIN 1 SENSITIVE Sensitive   ?  TETRACYCLINE <=1 SENSITIVE Sensitive   ?  VANCOMYCIN <=0.5 SENSITIVE Sensitive   ?  TRIMETH/SULFA <=10 SENSITIVE Sensitive   ?  CLINDAMYCIN <=0.25 SENSITIVE Sensitive   ?  RIFAMPIN <=0.5 SENSITIVE Sensitive   ?  Inducible Clindamycin NEGATIVE Sensitive   ?  * RARE STAPHYLOCOCCUS LUGDUNENSIS  ?Culture, blood (routine x 2)  Status: None (Preliminary result)  ? Collection Time: 12/15/21 10:45 AM  ? Specimen: BLOOD LEFT HAND  ?Result Value Ref Range Status  ? Specimen Description BLOOD LEFT HAND  Final  ? Special Requests   Final  ?  BOTTLES DRAWN AEROBIC AND ANAEROBIC Blood Culture adequate volume  ? Culture   Final  ?  NO GROWTH < 24 HOURS ?Performed at [facility], [place] [address] [place], [place] [address] ?  ? Report Status PENDING  Incomplete  ?Culture, blood (routine x 2)     Status: None (Preliminary result)  ? Collection Time: 12/15/21 10:52 AM  ? Specimen: BLOOD RIGHT HAND  ?Result Value Ref Range Status  ? Specimen Description BLOOD RIGHT HAND  Final  ? Special Requests   Final  ?  BOTTLES DRAWN AEROBIC AND ANAEROBIC Blood Culture adequate volume  ? Culture   Final  ?  NO GROWTH < 24 HOURS ?Performed at [facility], [place]  [address], [place], [place] [address] ?  ? Report Status PENDING  Incomplete  ? ? ? ?Pertinent Lab. ?CBC Latest Ref Rng & Units 12/13/2021 12/09/2021 11/24/2021  ?WBC 4.0 - 10.5 K/uL 11.9(H) 17.5(H) 8.1  ?Hemoglobin 13.0 - 17.0 g/dL 1

## 2021-12-16 NOTE — Progress Notes (Signed)
ANTICOAGULATION CONSULT NOTE - Initial Consult ? ?Pharmacy Consult for Xarelto ?Indication: DVT ? ?No Known Allergies ? ?Patient Measurements: ?Height: '5\' 8"'$  (172.7 cm) ?Weight: 114.8 kg (253 lb) ?IBW/kg (Calculated) : 68.4 ? ?Vital Signs: ?Temp: 99.3 ?F (37.4 ?C) (03/21 1548) ?Temp Source: Oral (03/21 1548) ?BP: 138/80 (03/21 1548) ?Pulse Rate: 94 (03/21 1548) ? ?Labs: ?Recent Labs  ?  12/14/21 ?0616 12/16/21 ?6759  ?CREATININE 0.90 0.96  ? ? ?Estimated Creatinine Clearance: 105.7 mL/min (by C-G formula based on SCr of 0.96 mg/dL). ? ? ?Medical History: ?Past Medical History:  ?Diagnosis Date  ? GERD (gastroesophageal reflux disease)   ? Hypertension   ? ? ?Medications:  ?Medications Prior to Admission  ?Medication Sig Dispense Refill Last Dose  ? amLODipine (NORVASC) 10 MG tablet Take 10 mg by mouth daily.     ? gabapentin (NEURONTIN) 100 MG capsule Take 1 capsule (100 mg total) by mouth 3 (three) times daily. 90 capsule 2   ? methocarbamol (ROBAXIN) 500 MG tablet Take 1 tablet (500 mg total) by mouth every 8 (eight) hours as needed for muscle spasms. 40 tablet 0   ? omeprazole (PRILOSEC) 40 MG capsule TAKE 1 CAPSULE(40 MG) BY MOUTH DAILY 90 capsule 3   ? oxyCODONE-acetaminophen (PERCOCET) 5-325 MG tablet Take 1 tablet by mouth every 4 (four) hours as needed for severe pain. 45 tablet 0   ? ? ?Assessment: ?56 y.o. s/p lumbar re-exploration 3/15 for surgical site infection. Known to pharmacy from antibiotic dosing. Dopplers shows acute deep vein thrombosis involving the right popliteal vein, right femoral vein, and right posterior tibial veins. To begin Xarelto. Currently pt on ASA '325mg'$  daily and SCDs for VTE prophylaxis. Last CBC stable on 3/18. ? ?Goal of Therapy:  ?Treatment of VTE ?Monitor platelets by anticoagulation protocol: Yes ?  ?Plan:  ?Xarelto '15mg'$  BID x 21 days then on 4/11 change to '20mg'$  daily ?Will f/u CBC and watch closely for s/s bleeding ?D/w ortho in am about d/c daily ASA ?Will f/u with  pharmacy patient advocate specialist re: copay for Xarelto ?Will educate pt prior to d/c ? ?Sherlon Handing, PharmD, BCPS ?Please see amion for complete clinical pharmacist phone list ?12/16/2021,6:17 PM ? ? ?

## 2021-12-17 ENCOUNTER — Other Ambulatory Visit (HOSPITAL_COMMUNITY): Payer: Self-pay

## 2021-12-17 DIAGNOSIS — I82409 Acute embolism and thrombosis of unspecified deep veins of unspecified lower extremity: Secondary | ICD-10-CM | POA: Clinically undetermined

## 2021-12-17 DIAGNOSIS — G9782 Other postprocedural complications and disorders of nervous system: Secondary | ICD-10-CM | POA: Diagnosis not present

## 2021-12-17 DIAGNOSIS — G9600 Cerebrospinal fluid leak, unspecified: Secondary | ICD-10-CM | POA: Diagnosis not present

## 2021-12-17 LAB — COMPREHENSIVE METABOLIC PANEL
ALT: 54 U/L — ABNORMAL HIGH (ref 0–44)
AST: 32 U/L (ref 15–41)
Albumin: 2.7 g/dL — ABNORMAL LOW (ref 3.5–5.0)
Alkaline Phosphatase: 110 U/L (ref 38–126)
Anion gap: 9 (ref 5–15)
BUN: 18 mg/dL (ref 6–20)
CO2: 21 mmol/L — ABNORMAL LOW (ref 22–32)
Calcium: 9 mg/dL (ref 8.9–10.3)
Chloride: 103 mmol/L (ref 98–111)
Creatinine, Ser: 1.05 mg/dL (ref 0.61–1.24)
GFR, Estimated: >60 mL/min (ref >60)
Glucose, Bld: 105 mg/dL — ABNORMAL HIGH (ref 70–99)
Potassium: 4.3 mmol/L (ref 3.5–5.1)
Sodium: 133 mmol/L — ABNORMAL LOW (ref 135–145)
Total Bilirubin: 0.5 mg/dL (ref 0.3–1.2)
Total Protein: 7 g/dL (ref 6.5–8.1)

## 2021-12-17 LAB — CBC
HCT: 34.5 % — ABNORMAL LOW (ref 39.0–52.0)
Hemoglobin: 11.2 g/dL — ABNORMAL LOW (ref 13.0–17.0)
MCH: 28.4 pg (ref 26.0–34.0)
MCHC: 32.5 g/dL (ref 30.0–36.0)
MCV: 87.6 fL (ref 80.0–100.0)
Platelets: 536 10*3/uL — ABNORMAL HIGH (ref 150–400)
RBC: 3.94 MIL/uL — ABNORMAL LOW (ref 4.22–5.81)
RDW: 15.4 % (ref 11.5–15.5)
WBC: 12.3 10*3/uL — ABNORMAL HIGH (ref 4.0–10.5)
nRBC: 0 % (ref 0.0–0.2)

## 2021-12-17 LAB — SEDIMENTATION RATE: Sed Rate: 92 mm/hr — ABNORMAL HIGH (ref 0–16)

## 2021-12-17 LAB — C-REACTIVE PROTEIN: CRP: 7.1 mg/dL — ABNORMAL HIGH (ref <1.0)

## 2021-12-17 MED ORDER — RIVAROXABAN 15 MG PO TABS: 15.0000 mg | ORAL_TABLET | Freq: Two times a day (BID) | ORAL | Status: DC

## 2021-12-17 MED ORDER — RIVAROXABAN 20 MG PO TABS
20.0000 mg | ORAL_TABLET | Freq: Every day | ORAL | Status: DC
Start: 2022-01-06 — End: 2022-01-19

## 2021-12-17 MED ORDER — LINEZOLID 600 MG PO TABS
600.0000 mg | ORAL_TABLET | Freq: Two times a day (BID) | ORAL | Status: DC
  Administered 2021-12-17: 600 mg via ORAL
  Filled 2021-12-17 (×2): qty 1

## 2021-12-17 MED ORDER — LINEZOLID 600 MG PO TABS: 600.0000 mg | ORAL_TABLET | Freq: Two times a day (BID) | ORAL | 0 refills | Status: DC

## 2021-12-17 NOTE — Progress Notes (Signed)
Pharmacy Note  ? ?Patient was discharged prior to receiving education on Xarelto. I spoke with the patient over the phone to provide [place] education, answered their questions and performed teach-back.  ? ?Eliseo Gum, PharmD Candidate  ?12/17/2021 3:45 PM  ?

## 2021-12-17 NOTE — TOC Transition Note (Signed)
Transition of Care (TOC) - CM/SW Discharge Note ? ? ?Patient Details  ?Name: Brandon Burton ?MRN: [id number] ?Date of Birth: 03-19-1966 ? ?Transition of Care (TOC) CM/SW Contact:  ?Angelita Ingles, RN ?Phone Number:[phone] ? ?12/17/2021, 10:38 AM ? ? ?Clinical Narrative:    ?Patient with d/c orders. No TOC needs noted ? ? ?  ?  ? ? ?Patient Goals and CMS Choice ?  ?  ?  ? ?Discharge Placement ?  ?           ?  ?  ?  ?  ? ?Discharge Plan and Services ?  ?  ?           ?  ?  ?  ?  ?  ?  ?  ?  ?  ?  ? ?Social Determinants of Health (SDOH) Interventions ?  ? ? ?Readmission Risk Interventions ?   ? View : No data to display.  ?  ?  ?  ? ? ? ? ? ?

## 2021-12-17 NOTE — Progress Notes (Signed)
Patient ID: Brandon Burton, male   DOB: 1966/09/22, 56 y.o.   MRN: [id number] ? ? ?Subjective: ?7 Days Post-Op Procedure(s) (LRB): ?LUMBAR RE-EXPLORATION (N/A) ?Patient reports pain as mild.   ? ?Objective: ?Vital signs in last 24 hours: ?Temp:  [98.5 ?F (36.9 ?C)-99.5 ?F (37.5 ?C)] 98.5 ?F (36.9 ?C) (03/22 9030) ?Pulse Rate:  [88-108] 88 (03/22 0333) ?Resp:  [15-18] 18 (03/22 0333) ?BP: (116-140)/(70-87) 116/70 (03/22 0333) ?SpO2:  [98 %-100 %] 98 % (03/22 0333) ? ?Intake/Output from previous day: ?03/21 0701 - 03/22 0700 ?In: 1030 [P.O.:1030] ?Out: 400 [Urine:400] ?Intake/Output this shift: ?No intake/output data recorded. ? ?Recent Labs  ?  12/17/21 ?0702  ?HGB 11.2*  ? ?Recent Labs  ?  12/17/21 ?0702  ?WBC 12.3*  ?RBC 3.94*  ?HCT 34.5*  ?PLT 536*  ? ?Recent Labs  ?  12/16/21 ?0923 12/17/21 ?3007  ?NA 134* 133*  ?K 4.1 4.3  ?CL 102 103  ?CO2 22 21*  ?BUN 15 18  ?CREATININE 0.96 1.05  ?GLUCOSE 114* 105*  ?CALCIUM 8.5* 9.0  ? ?No results for input(s): LABPT, INR in the last 72 hours. ? ?Neurologically intact ?VAS [place] LOWER EXTREMITY VENOUS (DVT) ? ?Result Date: 12/16/2021 ? Lower Venous DVT Study Patient Name:  Brandon Burton  Date of Exam:   12/16/2021 Medical Rec #: [id number]      Accession #:    [id number] Date of Birth: [birth year]       Patient Gender: M Patient Age:   64 years Exam Location:  [facility] Procedure:      VAS [place] LOWER EXTREMITY VENOUS (DVT) Referring Phys: Bret Stamour --------------------------------------------------------------------------------  Indications: Edema.  Risk Factors: Surgery S/P lumbar surgery (11/26/21). Comparison Study: No previous exams Performing Technologist: Jody Hill RVT, RDMS  Examination Guidelines: A complete evaluation includes B-mode imaging, spectral Doppler, color Doppler, and power Doppler as needed of all accessible portions of each vessel. Bilateral testing is considered an integral part of a complete examination. Limited examinations for reoccurring indications may  be performed as noted. The reflux portion of the exam is performed with the patient in reverse Trendelenburg.  +---------+---------------+---------+-----------+----------+--------------+ RIGHT    CompressibilityPhasicitySpontaneityPropertiesThrombus Aging +---------+---------------+---------+-----------+----------+--------------+ CFV      Full           Yes      Yes                                 +---------+---------------+---------+-----------+----------+--------------+ SFJ      Full                                                        +---------+---------------+---------+-----------+----------+--------------+ FV Prox  Full           Yes      Yes                                 +---------+---------------+---------+-----------+----------+--------------+ FV Mid   Full           Yes      Yes                                 +---------+---------------+---------+-----------+----------+--------------+  FV DistalNone           No       No                   Acute          +---------+---------------+---------+-----------+----------+--------------+ PFV      Full                                                        +---------+---------------+---------+-----------+----------+--------------+ POP      None           Yes      Yes                  Acute          +---------+---------------+---------+-----------+----------+--------------+ PTV      None           No       No                   Acute          +---------+---------------+---------+-----------+----------+--------------+ PERO     Full                                                        +---------+---------------+---------+-----------+----------+--------------+   +---------+---------------+---------+-----------+----------+--------------+ LEFT     CompressibilityPhasicitySpontaneityPropertiesThrombus Aging +---------+---------------+---------+-----------+----------+--------------+ CFV       Full           Yes      Yes                                 +---------+---------------+---------+-----------+----------+--------------+ SFJ      Full                                                        +---------+---------------+---------+-----------+----------+--------------+ FV Prox  Full           Yes      Yes                                 +---------+---------------+---------+-----------+----------+--------------+ FV Mid   Full           Yes      Yes                                 +---------+---------------+---------+-----------+----------+--------------+ FV DistalFull           Yes      Yes                                 +---------+---------------+---------+-----------+----------+--------------+ PFV      Full                                                        +---------+---------------+---------+-----------+----------+--------------+  POP      Full           Yes      Yes                                 +---------+---------------+---------+-----------+----------+--------------+ PTV      Full                                                        +---------+---------------+---------+-----------+----------+--------------+ PERO     Full                                                        +---------+---------------+---------+-----------+----------+--------------+     Summary: BILATERAL: - No evidence of superficial venous thrombosis in the lower extremities, bilaterally. -No evidence of popliteal cyst, bilaterally. RIGHT: - Findings consistent with acute deep vein thrombosis involving the right popliteal vein, right femoral vein, and right posterior tibial veins.  LEFT: - There is no evidence of deep vein thrombosis in the lower extremity.  *See table(s) above for measurements and observations. Electronically signed by Deitra Mayo MD on 12/16/2021 at 7:18:29 PM.    Final    ? ?Assessment/Plan: ?7 Days Post-Op Procedure(s) (LRB): ?LUMBAR  RE-EXPLORATION (N/A) ?Xarelto for DVT.  Has one half  bottle Percocet at home.  Per ID team linezolid 600 mg p.o. twice daily. Office one week .  ?Discussed protein albumin low and hypoalbuminemia impairing wound healing.  Current albumin 2.7.  It was 3 6 and is decreased.  We have encouraged him to take Ensure twice a day plus increase his protein intake and he still working on this.  He does have hypoalbuminemia which impairs healing.  Patient's dressing is dry ?Applied and he will see me in 1 week. ?[name] ?12/17/2021, 8:52 AM ? ? ? ? ? ?

## 2021-12-17 NOTE — TOC Benefit Eligibility Note (Signed)
Patient Advocate Encounter ? ?Insurance verification completed.   ? ?The patient is currently admitted and upon discharge could be taking Xarelto 20 mg. ? ?The current 30 day co-pay is, $25.00.  ? ?The patient is insured through [employer]  ? ? ? ?Lyndel Safe, CPhT ?Pharmacy Patient Advocate Specialist ?[facility] Patient Advocate Team ?Direct Number: [phone]  Fax: [phone] ? ? ? ? ? ?  ?

## 2021-12-17 NOTE — Progress Notes (Signed)
? ?[facility] Infectious Diseases Follow Up Note ? ?Patient Identification: ?Patient Name: Brandon Burton MRN: [id number] [place] Date: 12/09/2021 12:57 PM ?Age: 56 y.o.Today's Date: 12/17/2021 ? ? ?Reason for Visit: surgical site infection/fevers  ? ?Principal Problem: ?  Postoperative CSF leak ?Active Problems: ?  Status post lumbar spine surgery for decompression of spinal cord ?  Wound drainage ? ? ?Antibiotics:  ?Cefazolin 3/14-3/16 ?Vancomycin 3/14-c ? ?Lines/Hardwares:  ? ?Interval Events: afebrile in the last 24 hrs. DVT in rt leg on doppler of lower extremities  ? ? ?Assessment ?# Lumbar surgical site infection/deep to dural space with dural leak s/p  Lumbar exploration L3-L5 decompression with dural repair 3/15. OR cx ( MRSE, MS staph lugdunensis, strep agalactiae) ?- 11/26/21:  L3-4, L4-5 decompression.  Near complete laminectomy L3, completed L4, complete laminectomy L5.  Repair of 3 mm Intra-Op durotomy with primary repair sutures.  Right L3-4 microdiscectomy removal of extruded fragment ? ?# Fevers ?- afebrile in the last 24 hrs ?- Found to have DVT in rt leg and started on Xarelto ?- Blood cx Burton growth in 2 days  ? ?Recommendations ?Ok to switch to linezolid '600mg'$  PO BID on discharge as previously stated on 3/21 ?Follow up at [facility] with myself on 3/28 at 9 am  ? ?Rest of the management as per the primary team. ?Thank you for the consult. Please page with pertinent questions or concerns. ? ?______________________________________________________________________ ?Subjective ?patient seen and examined at the bedside. ?Sitting up in the chair ?Burton concerns and eager to go home ?D/w Dr Lorin Mercy  ? ? ?Vitals ?BP 116/70 (BP Location: Left Arm)   Pulse 88   Temp 98.5 ?F (36.9 ?C) (Oral)   Resp 18   Ht '5\' 8"'$  (1.727 m)   Wt 114.8 kg   SpO2 98%   BMI 38.47 kg/m?  ? ?  ?Physical Exam ?Constitutional: not in acute distress ?   Comments:  ? ?Cardiovascular:  ?   Rate  and Rhythm: Normal rate and regular rhythm.  ?   Heart sounds:  ? ?Pulmonary:  ?   Effort: Pulmonary effort is normal.  ?   Comments:  ? ?Abdominal:  ?   Palpations: Abdomen is soft.  ?   Tenderness: non distended and non tender  ? ?Musculoskeletal:     ?   General: Burton swelling or tenderness.  ? ?Skin: ?   Comments: back wound with bandage and Burton strikethrough ? ?Neurological:  ?   General: awake, alert and oriented, spontaneously moves all extremities  ? ?Psychiatric:     ?   Mood and Affect: Mood normal.  ? ? ?Pertinent Microbiology ?Results for orders placed or performed during the hospital encounter of 12/09/21  ?Surgical PCR screen     Status: None  ? Collection Time: 12/09/21  1:30 PM  ? Specimen: Nasal Mucosa; Nasal Swab  ?Result Value Ref Range Status  ? MRSA, PCR NEGATIVE NEGATIVE Final  ? Staphylococcus aureus NEGATIVE NEGATIVE Final  ?  Comment: (NOTE) ?The Xpert SA Assay (FDA approved for NASAL specimens in patients 34 ?years of age and older), is one component of a comprehensive ?surveillance program. It is not intended to diagnose infection nor to ?guide or monitor treatment. ?Performed at [facility], [place] [address], [place], [place] ?[address] ?  ?SARS Coronavirus 2 by RT PCR (hospital order, performed in [facility] hospital lab) Nasopharyngeal Nasopharyngeal Swab     Status: None  ? Collection Time: 12/10/21  8:00 AM  ?  Specimen: Nasopharyngeal Swab  ?Result Value Ref Range Status  ? SARS Coronavirus 2 NEGATIVE NEGATIVE Final  ?  Comment: (NOTE) ?SARS-CoV-2 target nucleic acids are NOT DETECTED. ? ?The SARS-CoV-2 RNA is generally detectable in upper and lower ?respiratory specimens during the acute phase of infection. The lowest ?concentration of SARS-CoV-2 viral copies this assay can detect is 250 ?copies / mL. A negative result does not preclude SARS-CoV-2 infection ?and should not be used as the sole basis for treatment or other ?patient management decisions.  A negative result may occur  with ?improper specimen collection / handling, submission of specimen other ?than nasopharyngeal swab, presence of viral mutation(s) within the ?areas targeted by this assay, and inadequate number of viral copies ?(<250 copies / mL). A negative result must be combined with clinical ?observations, patient history, and epidemiological information. ? ?Fact Sheet for Patients:   ?StrictlyIdeas.Burton ? ?Fact Sheet for Healthcare Providers: ?BankingDealers.co.za ? ?This test is not yet approved or  cleared by the [place] FDA and ?has been authorized for detection and/or diagnosis of SARS-CoV-2 by ?FDA under an Emergency Use Authorization (EUA).  This EUA will remain ?in effect (meaning this test can be used) for the duration of the ?COVID-19 declaration under Section 564(b)(1) of the Act, 21 U.S.C. ?section 360bbb-3(b)(1), unless the authorization is terminated or ?revoked sooner. ? ?Performed at [facility], [place] [address] [place], [place] ?[address] ?  ?Aerobic/Anaerobic Culture w Gram Stain (surgical/deep wound)     Status: None  ? Collection Time: 12/10/21  4:34 PM  ? Specimen: Soft Tissue, Other  ?Result Value Ref Range Status  ? Specimen Description WOUND  Final  ? Special Requests LUMBAR CULTURE  Final  ? Gram Stain   Final  ?  RARE WBC PRESENT, PREDOMINANTLY PMN ?Burton ORGANISMS SEEN ?  ? Culture   Final  ?  RARE STAPHYLOCOCCUS EPIDERMIDIS ?RARE STAPHYLOCOCCUS LUGDUNENSIS ?RARE GROUP B STREP(S.AGALACTIAE)ISOLATED ?TESTING AGAINST S. AGALACTIAE NOT ROUTINELY PERFORMED DUE TO PREDICTABILITY OF AMP/PEN/VAN SUSCEPTIBILITY. ?Burton ANAEROBES ISOLATED ?Performed at [facility], [place] [address], [place], [place] [address] ?  ? Report Status 12/15/2021 FINAL  Final  ? Organism ID, Bacteria STAPHYLOCOCCUS LUGDUNENSIS  Final  ? Organism ID, Bacteria STAPHYLOCOCCUS EPIDERMIDIS  Final  ?    Susceptibility  ? Staphylococcus epidermidis - MIC*  ?  CIPROFLOXACIN <=0.5  SENSITIVE Sensitive   ?  ERYTHROMYCIN >=8 RESISTANT Resistant   ?  GENTAMICIN <=0.5 SENSITIVE Sensitive   ?  OXACILLIN >=4 RESISTANT Resistant   ?  TETRACYCLINE >=16 RESISTANT Resistant   ?  VANCOMYCIN 2 SENSITIVE Sensitive   ?  TRIMETH/SULFA <=10 SENSITIVE Sensitive   ?  CLINDAMYCIN <=0.25 SENSITIVE Sensitive   ?  RIFAMPIN <=0.5 SENSITIVE Sensitive   ?  Inducible Clindamycin NEGATIVE Sensitive   ?  * RARE STAPHYLOCOCCUS EPIDERMIDIS  ? Staphylococcus lugdunensis - MIC*  ?  CIPROFLOXACIN <=0.5 SENSITIVE Sensitive   ?  ERYTHROMYCIN <=0.25 SENSITIVE Sensitive   ?  GENTAMICIN <=0.5 SENSITIVE Sensitive   ?  OXACILLIN 1 SENSITIVE Sensitive   ?  TETRACYCLINE <=1 SENSITIVE Sensitive   ?  VANCOMYCIN <=0.5 SENSITIVE Sensitive   ?  TRIMETH/SULFA <=10 SENSITIVE Sensitive   ?  CLINDAMYCIN <=0.25 SENSITIVE Sensitive   ?  RIFAMPIN <=0.5 SENSITIVE Sensitive   ?  Inducible Clindamycin NEGATIVE Sensitive   ?  * RARE STAPHYLOCOCCUS LUGDUNENSIS  ?Culture, blood (routine x 2)     Status: None (Preliminary result)  ? Collection Time: 12/15/21 10:45  AM  ? Specimen: BLOOD LEFT HAND  ?Result Value Ref Range Status  ? Specimen Description BLOOD LEFT HAND  Final  ? Special Requests   Final  ?  BOTTLES DRAWN AEROBIC AND ANAEROBIC Blood Culture adequate volume  ? Culture   Final  ?  Burton GROWTH < 24 HOURS ?Performed at [facility], [place] [address] [place], [place] [address] ?  ? Report Status PENDING  Incomplete  ?Culture, blood (routine x 2)     Status: None (Preliminary result)  ? Collection Time: 12/15/21 10:52 AM  ? Specimen: BLOOD RIGHT HAND  ?Result Value Ref Range Status  ? Specimen Description BLOOD RIGHT HAND  Final  ? Special Requests   Final  ?  BOTTLES DRAWN AEROBIC AND ANAEROBIC Blood Culture adequate volume  ? Culture   Final  ?  Burton GROWTH < 24 HOURS ?Performed at [facility], [place] [address] [place], [place] [address] ?  ? Report Status PENDING  Incomplete  ? ? ? ?Pertinent Lab. ?CBC Latest Ref Rng & Units 12/13/2021  12/09/2021 11/24/2021  ?WBC 4.0 - 10.5 K/uL 11.9(H) 17.5(H) 8.1  ?Hemoglobin 13.0 - 17.0 g/dL 11.3(L) 11.5(L) 13.7  ?Hematocrit 39.0 - 52.0 % 34.6(L) 35.1(L) 42.6  ?Platelets 150 - 400 K/uL 471(H) 643(H) 492(H)  ? ?CM

## 2021-12-17 NOTE — Evaluation (Signed)
Physical Therapy Evaluation ?Patient Details ?Name: Brandon Burton ?MRN: [id number] ?DOB: [birth year] ?Today's Date: 12/17/2021 ? ?History of Present Illness ? 56 year old male underwent L3-4 L4-5 lumbar decompression surgery for lumbar spinal stenosis with neurogenic claudication on 0/02/2375, complicated by 3 mm dural tear repain intra-op and post-operatively was doing well. Pt then with admitted 3/15 with drainage from incision s/p lumbar exploration L3-5 decompression and dural repair. PMH includes HTN and GERD.  ?Clinical Impression ? Pt presents to PT with deficits in activity tolerance, power, strength, endurance. Pt is able to ambulate for household distances without physical assistance at this time. Pt with slowed speed of gait and stair negotiation at this time. PT provides instruction for gradual increase in daily activity, with avoidance of activities that would cause excessive strain. PT will continue to follow during admission, no PT needs at the time of discharge.   ?   ? ?Recommendations for follow up therapy are one component of a multi-disciplinary discharge planning process, led by the attending physician.  Recommendations may be updated based on patient status, additional functional criteria and insurance authorization. ? ?Follow Up Recommendations No PT follow up ? ?  ?Assistance Recommended at Discharge Intermittent Supervision/Assistance  ?Patient can return home with the following ?   ? ?  ?Equipment Recommendations None recommended by PT  ?Recommendations for Other Services ?    ?  ?Functional Status Assessment Patient has had a recent decline in their functional status and demonstrates the ability to make significant improvements in function in a reasonable and predictable amount of time.  ? ?  ?Precautions / Restrictions Precautions ?Precautions: Back ?Precaution Booklet Issued: Yes (comment) ?Required Braces or Orthoses:  (no brace needed per orders) ?Restrictions ?Weight Bearing Restrictions:  No  ? ?  ? ?Mobility ? Bed Mobility ?Overal bed mobility: Needs Assistance ?Bed Mobility: Rolling, Sidelying to Sit ?Rolling: Modified independent (Device/Increase time) ?Sidelying to sit: Modified independent (Device/Increase time) ?  ?  ?  ?General bed mobility comments: increased time ?  ? ?Transfers ?Overall transfer level: Needs assistance ?Equipment used: Rolling walker (2 wheels) ?Transfers: Sit to/from Stand ?Sit to Stand: Supervision ?  ?  ?  ?  ?  ?  ?  ? ?Ambulation/Gait ?Ambulation/Gait assistance: Supervision ?Gait Distance (Feet): 250 Feet ?Assistive device: Rolling walker (2 wheels), None (125' with RW, 125' without device) ?Gait Pattern/deviations: Step-through pattern ?Gait velocity: functional ?Gait velocity interpretation: >2.62 ft/sec, indicative of community ambulatory ?  ?General Gait Details: pt with slowed step-through gait, 2 brief standing rest breaks ? ?Stairs ?Stairs: Yes ?Stairs assistance: Supervision ?Stair Management: One rail Right, Step to pattern, Forwards ?Number of Stairs: 4 ?  ? ?Wheelchair Mobility ?  ? ?Modified Rankin (Stroke Patients Only) ?  ? ?  ? ?Balance Overall balance assessment: Needs assistance ?Sitting-balance support: No upper extremity supported, Feet supported ?Sitting balance-Leahy Scale: Good ?  ?  ?Standing balance support: No upper extremity supported, During functional activity ?Standing balance-Leahy Scale: Good ?  ?  ?  ?  ?  ?  ?  ?  ?  ?  ?  ?  ?   ? ? ? ?Pertinent Vitals/Pain Pain Assessment ?Pain Assessment: Faces ?Faces Pain Scale: Hurts little more ?Pain Location: back ?Pain Descriptors / Indicators: Sore ?Pain Intervention(s): Monitored during session  ? ? ?Home Living Family/patient expects to be discharged to:: Private residence ?Living Arrangements: Spouse/significant other;Children ?Available Help at Discharge: Family ?Type of Home: House ?Home Access: Stairs to enter ?Entrance Stairs-Rails: Right;Left;Can reach both ?  Entrance Stairs-Number of  Steps: 2 ?  ?Home Layout: One level ?Home Equipment: Conservation officer, nature (2 wheels) ?   ?  ?Prior Function Prior Level of Function : Independent/Modified Independent;Driving;Working/employed Patent examiner) ?  ?  ?  ?  ?  ?  ?  ?  ?  ? ? ?Hand Dominance  ? Dominant Hand: Right ? ?  ?Extremity/Trunk Assessment  ? Upper Extremity Assessment ?Upper Extremity Assessment: Overall WFL for tasks assessed ?  ? ?Lower Extremity Assessment ?Lower Extremity Assessment: Overall WFL for tasks assessed ?  ? ?Cervical / Trunk Assessment ?Cervical / Trunk Assessment: Back Surgery  ?Communication  ? Communication: No difficulties  ?Cognition Arousal/Alertness: Awake/alert ?Behavior During Therapy: [facility] for tasks assessed/performed ?Overall Cognitive Status: Within Functional Limits for tasks assessed ?  ?  ?  ?  ?  ?  ?  ?  ?  ?  ?  ?  ?  ?  ?  ?  ?  ?  ?  ? ?  ?General Comments General comments (skin integrity, edema, etc.): VSS on RA ? ?  ?Exercises    ? ?Assessment/Plan  ?  ?PT Assessment Patient needs continued PT services  ?PT Problem List Decreased strength;Decreased activity tolerance;Decreased balance;Decreased mobility ? ?   ?  ?PT Treatment Interventions DME instruction;Stair training;Gait training;Functional mobility training;Therapeutic activities;Therapeutic exercise;Balance training;Patient/family education   ? ?PT Goals (Current goals can be found in the Care Plan section)  ?Acute Rehab PT Goals ?Patient Stated Goal: to return to work ?PT Goal Formulation: With patient/family ?Time For Goal Achievement: 12/31/21 ?Potential to Achieve Goals: Good ? ?  ?Frequency Min 5X/week ?  ? ? ?Co-evaluation   ?  ?  ?  ?  ? ? ?  ?AM-PAC PT "6 Clicks" Mobility  ?Outcome Measure Help needed turning from your back to your side while in a flat bed without using bedrails?: None ?Help needed moving from lying on your back to sitting on the side of a flat bed without using bedrails?: None ?Help needed moving to and from a bed to a chair (including a  wheelchair)?: A Little ?Help needed standing up from a chair using your arms (e.g., wheelchair or bedside chair)?: A Little ?Help needed to walk in hospital room?: A Little ?Help needed climbing 3-5 steps with a railing? : A Little ?6 Click Score: 20 ? ?  ?End of Session   ?Activity Tolerance: Patient tolerated treatment well ?Patient left: in bed;with call bell/phone within reach;with family/visitor present ?Nurse Communication: Mobility status ?PT Visit Diagnosis: Other abnormalities of gait and mobility (R26.89) ?  ? ?Time: 0354-6568 ?PT Time Calculation (min) (ACUTE ONLY): 28 min ? ? ?Charges:   PT Evaluation ?$PT Eval Low Complexity: 1 Low ?  ?  ?   ? ? ?Zenaida Niece, PT, DPT ?Acute Rehabilitation ?Pager: [phone] ?Office [phone] ? ? ?Zenaida Niece ?12/17/2021, 9:54 AM ? ?

## 2021-12-17 NOTE — Discharge Instructions (Addendum)
Walk daily avoid bending lifting.  You may shower with your dressing on.  See Dr. Lorin Mercy in 1 week.  Call if you have fever above 101.  Keep appointment with infectious disease doctor at discharge. ? ? ? ? ? ? ? ? ?Information on my medicine - XARELTO? (rivaroxaban) ? ?This medication education was reviewed with me or my healthcare representative as part of my discharge preparation.   ? ?WHY WAS XARELTO? PRESCRIBED FOR YOU? ?Xarelto? was prescribed to treat blood clots that may have been found in the veins of your legs (deep vein thrombosis) or in your lungs (pulmonary embolism) and to reduce the risk of them occurring again. ? ?What do you need to know about Xarelto?? ?The starting dose is one 15 mg tablet taken TWICE daily with food for the FIRST 21 DAYS then on (enter date)  01/06/22  the dose is changed to one 20 mg tablet taken ONCE A DAY with your evening meal. ? ?DO NOT stop taking Xarelto? without talking to the health care provider who prescribed the medication.  Refill your prescription for 20 mg tablets before you run out. ? ?After discharge, you should have regular check-up appointments with your healthcare provider that is prescribing your Xarelto?Marland Kitchen  In the future your dose may need to be changed if your kidney function changes by a significant amount. ? ?What do you do if you miss a dose? ?If you are taking Xarelto? TWICE DAILY and you miss a dose, take it as soon as you remember. You may take two 15 mg tablets (total 30 mg) at the same time then resume your regularly scheduled 15 mg twice daily the next day. ? ?If you are taking Xarelto? ONCE DAILY and you miss a dose, take it as soon as you remember on the same day then continue your regularly scheduled once daily regimen the next day. Do not take two doses of Xarelto? at the same time.  ? ?Important Safety Information ?Xarelto? is a blood thinner medicine that can cause bleeding. You should call your healthcare provider right away if you experience  any of the following: ?Bleeding from an injury or your nose that does not stop. ?Unusual colored urine (red or dark brown) or unusual colored stools (red or black). ?Unusual bruising for unknown reasons. ?A serious fall or if you hit your head (even if there is no bleeding). ? ?Some medicines may interact with Xarelto? and might increase your risk of bleeding while on Xarelto?Marland Kitchen To help avoid this, consult your healthcare provider or pharmacist prior to using any new prescription or non-prescription medications, including herbals, vitamins, non-steroidal anti-inflammatory drugs (NSAIDs) and supplements. ? ?This website has more information on Xarelto?: [email or web address]://guerra-benson.com/.  ?

## 2021-12-19 ENCOUNTER — Telehealth: Payer: Self-pay | Admitting: Radiology

## 2021-12-19 NOTE — Telephone Encounter (Signed)
FYI ? ? ?Received call from [place], RN Case Manager with [facility]. She wanted to be sure that you are aware of patient's recent hospitalization and offer her assistance as a case manager if anything else was needed for the patient.  ? ?I advised you are aware, saw patient on day of discharge, and that post op appointment has been scheduled. She will document this.  If you need anything further from her, please call. ? ?CB [phone] ?

## 2021-12-19 NOTE — Discharge Summary (Signed)
Physician Discharge Summary  ?Patient ID: ?Brandon Burton ?MRN: [id number] ?DOB/AGE: [birth year] 56 y.o. ? ?Admit date: 12/09/2021 ?Discharge date: 12/19/2021 ? ?Admission Diagnoses:post 2 level laminectomy with CSF leak ? ?Discharge Diagnoses:  ?Principal Problem: ?  Postoperative CSF leak ?Active Problems: ?  Status post lumbar spine surgery for decompression of spinal cord ?  Wound drainage ?  DVT (deep venous thrombosis) ([place]) ?Post op infection with Staphlococcus epidermidis MRSE,Staphloccous Lugdunensis sensitive to all ABX tested, Group B Strept (S. Agalactiae) ? ?Discharged Condition: good ? ?Hospital Course: 56 year old male had lumbar spinal stenosis decompression for neurogenic claudication symptoms without instability with central decompression at L3-4 and L4-5 and removal of extruded fragment right L3-4 on 11/26/2021.  During the procedure he had an incidental durotomy repaired with 4-0 Nurolon sutures and DuraSeal.  There was no leakage after repair and the following morning he was discharged home with no headache and no CSF leak.  Patient had BMI of 38.958 256 pounds and had glucose levels between 101 and 120 not on any medication.  PCR for surgery on 11/26/2021 was negative.  I talked him in the interim postop by phone several times he was walking well with no numbness or tingling but after 5 days suddenly noted some serosanguineous mostly clear drainage from his incision and he was placed at bedrest.  He did not have any drainage after few days got up and states he was more active and suddenly noted some intermittent headache problems and increased drainage from his back.  I saw him on 12/09/2021 obtained a culture in the office which Gram stain showed no bacteria rare poly and no organisms.  I prepped with Betadine and obtain culture from his incision which was closed at the skin and had no cellulitis and not surprisingly few days later culture result grew group B strep.  When I had seen him since he was  continued to have drainage which was clear it was obvious that the CSF repair had continued to leak even though he had a very small tear dorsally at the L4-5 level repaired with 3 simple sutures.  He had times when he rolled over had some considerable amount of bloody drainage that squeezed out of the incision which was in a subcutaneous pocket and some days he has had to change the dressing 2 or 3 times.  Repair at the time of surgery was watertight at the time of closure.  When I saw him in the office 12/09/2021 admitted into the hospital and schedule him for exploration, cultures dural repair.  Surgery was done on 12/10/2021 and Dr. Basil Dess assisted.  There is a tiny area with some CSF leakage adjacent to the suture of the 4-0 Nurolon and several interrupted 6-0 Prolene sutures were used plus again application of DuraSeal and again there was watertight closure no leakage with inspiration to 30 cm water pressure by CRNA.  And I kept him in bed for 5 days head down.  I personally closed his incision multiple sutures deep fascia and after repair of the dura we placed him vancomycin powder down deep.  I did not find any necrotic tissue down deep in the subcutaneous tissue there was some serosanguineous slightly cloudy fluid and cultures are listed as above.  I placed bioabsorbable beads with both tobramycin and also vancomycin in the subcutaneous tissue with his BMI this was a thick layer and I meticulously closed the dead space in the subcutaneous fat and then did interrupted vertical mattress nylon sutures with  the knots off to the side and then used Dermabond sealing the skin and attempt to stop drainage.  Patient was at bedrest and on day 3 of this hospitalization had bloody drainage again from his incision.  Several times I milked  bloody fluid out and reapplied sterile dressing after Betadine.  I did not put him on chemical DVT prophylaxis since his wound was continuing to drain copious amounts of bloody  drainage intermittently now is trying to get the wound sealed and this was discussed with patient as well as wife extensively and documented in the chart.  Blood drained out was primarily blood and antibiotic bead white milky chocolate powder.  After several days flat I placed him on aspirin and I discussed with him there is a risk of DVT having  him at bedrest but with his dressing being saturated with blood and I was milking this out and we had reoperated in an attempt to get the wound to seal and stop bleeding as well as a CSF leak I had him on aspirin and SCDs and also TED hose.  I ordered a D-dimer he had no swelling negative Homan no swelling in his legs D-dimer was positive  at 6.91 and a Doppler test was obtained that showed no DVT in the left leg but right leg had DVT femoral vein without occlusion popliteal vein and posterior tibial vein. ? ?Patient was placed on Xarelto starter pack and was discharged on Xarelto will be on Xarelto for at least 2 months. ? ?Infectious disease consultation was obtained after surgery and the patient  was seen by Dr. Linus Salmons and then Dr. West Bali.  After cultures were obtained on the office of the day of admission 12/09/2021 I placed patient on Ancef.  At the time of surgery when putting vancomycin we placed patient with infectious disease consultation on vancomycin and pharmacy consult obtained levels which showed his peak and trough for low and his vancomycin was adjusted up.  Ultimately we obtained sensitivities as listed above and infectious disease recommended switching him to Zyvox 600 mg p.o. twice daily ? ?Patient started sit up on 12/15/2021 no headache.  I was changing his dressing daily with Mepilex after Betadine swab and patient was able to ambulate with therapy with dry dressing.  PICC line was entertained however infectious disease felt he did not need vancomycin and would do well with Zyvox 600 mg twice daily.  Patient is walking up and down the halls no swelling  in his leg no shortness of breath no drainage from his incision and was discharged home on Xarelto and also Zyvox.  He has appointment to see me in 1 week.  ? ?Patient is happy with his progress glad to be discharged happy with the results of surgery with relief of his claudication as well as drainage. ? ?Patient had hypoalbuminemia with initially albumin 3.6 dropped to 3.0 at this admission and despite increased protein intake as well as Ensure cans twice a day his albumin decreased to 2.7 noted on 12/17/2021. ? ?Infection markers obtained originally showed CRP was 16 and dropped to 11.4 and then 7.1 at the time of discharge.  Sedimentation rate remained elevated at discharge with patient had antibiotic beads and vancomycin powder placed which causes irritation.  White count on admission was 17,500 and decreased down to 12.3 at discharge.  Patient was afebrile but the first 3 to 4 days postop he did have spikes in temperature to 102 degrees. ? ?Blood cultures were obtained and  were negative.  Patient was afebrile for 24 hours at time of discharge incision was dry he was amatory no fever incision looked good there was no drainage.  Patient has an office follow-up to see me in 5 days. ? ?I been talking to him by phone and is ambulating daily with no leg pain no drainage no fever and no problems with his medications. ? ? ? ? ?Consults: ID ? ?Significant Diagnostic Studies: Venous LE doppler ? ?Treatments: antibiotics: as detailed above. and surgery: as described above.  ? ?Discharge Exam: ?Blood pressure 116/70, pulse 88, temperature 98.5 ?F (36.9 ?C), temperature source Oral, resp. rate 18, height '5\' 8"'$  (1.727 m), weight 114.8 kg, SpO2 98 %. ?PE: Incision was dry at the time of discharge good lower extremity strength no fever no chills afebrile. ? ?Disposition: Discharge disposition: 01-Home or Self Care ? ? ? ? ? ? ?Discharge Instructions   ? ? Incentive spirometry RT   Complete by: As directed ?  ? ?  ? ?Allergies  as of 12/17/2021   ?No Known Allergies ?  ? ?  ?Medication List  ?  ? ?STOP taking these medications   ? ?gabapentin 100 MG capsule ?Commonly known as: NEURONTIN ?  ?methocarbamol 500 MG tablet ?Commonly known

## 2021-12-19 NOTE — Telephone Encounter (Signed)
noted 

## 2021-12-20 LAB — CULTURE, BLOOD (ROUTINE X 2)
Culture: NO GROWTH
Culture: NO GROWTH
Special Requests: ADEQUATE
Special Requests: ADEQUATE

## 2021-12-23 ENCOUNTER — Ambulatory Visit (INDEPENDENT_AMBULATORY_CARE_PROVIDER_SITE_OTHER): Payer: BC Managed Care – PPO | Admitting: Infectious Diseases

## 2021-12-23 ENCOUNTER — Other Ambulatory Visit: Payer: Self-pay

## 2021-12-23 ENCOUNTER — Encounter: Payer: Self-pay | Admitting: Infectious Diseases

## 2021-12-23 VITALS — BP 136/94 | HR 101 | Temp 98.8°F | Wt 240.0 lb

## 2021-12-23 DIAGNOSIS — Z5181 Encounter for therapeutic drug level monitoring: Secondary | ICD-10-CM | POA: Diagnosis not present

## 2021-12-23 DIAGNOSIS — T8149XA Infection following a procedure, other surgical site, initial encounter: Secondary | ICD-10-CM

## 2021-12-23 DIAGNOSIS — I82401 Acute embolism and thrombosis of unspecified deep veins of right lower extremity: Secondary | ICD-10-CM | POA: Diagnosis not present

## 2021-12-23 NOTE — Progress Notes (Addendum)
? ?  ? ?Patient Active Problem List  ? Diagnosis Date Noted  ? DVT (deep venous thrombosis) ([place]) 12/17/2021  ? Wound drainage 12/10/2021  ? Status post lumbar spine surgery for decompression of spinal cord 12/09/2021  ? Postoperative CSF leak 12/09/2021  ? Lumbar stenosis 11/26/2021  ? Spinal stenosis of lumbar region 07/04/2021  ? Lumbar disc herniation with radiculopathy 07/04/2021  ? History of colonic polyps 08/16/2018  ? GERD (gastroesophageal reflux disease) 07/08/2015  ? Rectal bleeding 07/08/2015  ? ANKLE SPRAIN 09/10/2009  ? CLOSED FRACTURE OF CALCANEUS 08/27/2009  ? SHOULDER STRAIN 02/27/2009  ? ? ?Patient's Medications  ?New Prescriptions  ? No medications on file  ?Previous Medications  ? AMLODIPINE (NORVASC) 10 MG TABLET    Take 10 mg by mouth daily.  ? LINEZOLID (ZYVOX) 600 MG TABLET    Take 1 tablet (600 mg total) by mouth 2 (two) times daily.  ? OMEPRAZOLE (PRILOSEC) 40 MG CAPSULE    TAKE 1 CAPSULE(40 MG) BY MOUTH DAILY  ? OXYCODONE-ACETAMINOPHEN (PERCOCET) 5-325 MG TABLET    Take 1 tablet by mouth every 4 (four) hours as needed for severe pain.  ? RIVAROXABAN (XARELTO) 15 MG TABS TABLET    Take 1 tablet (15 mg total) by mouth 2 (two) times daily with a meal.  ? RIVAROXABAN (XARELTO) 20 MG TABS TABLET    Take 1 tablet (20 mg total) by mouth daily with supper.  ?Modified Medications  ? No medications on file  ?Discontinued Medications  ? No medications on file  ? ? ?Subjective: ?Here for follow up for Lumbar surgical site infection/deep to dural space with dural leak s/p  Lumbar exploration L3-L5 decompression with dural repair 3/15. OR cx ( MRSE, MS staph lugdunensis, strep agalactiae). Accompanied by wife. He is taking linezolid twice a day as instructed. Denies missing doses. Denies fevers and chills. Denies nausea, vomiting and diarrhea. He has a fu with Dr Lorin Mercy on Thursday.No pain/tenderness in the lumbar area apart from mild itching.  No concerns otherwise.  ? ?Review of Systems: ?ROS all  systems reviewed and negative except as above ? ?Past Medical History:  ?Diagnosis Date  ? GERD (gastroesophageal reflux disease)   ? Hypertension   ? ?Past Surgical History:  ?Procedure Laterality Date  ? COLONOSCOPY N/A 07/22/2015  ? SLF:7 polyps removed/mild diverticulosis/moderate internal hemorrhoids  ? COLONOSCOPY N/A 11/18/2018  ? Procedure: COLONOSCOPY;  Surgeon: Danie Binder, MD;  [facility];  Service: Endoscopy;  Laterality: N/A;  2:15pm  ? EYE SURGERY    ? LUMBAR LAMINECTOMY N/A 12/10/2021  ? Procedure: LUMBAR RE-EXPLORATION;  Surgeon: [name], MD;  Location: [place];  Service: Orthopedics;  Laterality: N/A;  ? LUMBAR LAMINECTOMY/DECOMPRESSION MICRODISCECTOMY N/A 11/26/2021  ? Procedure: Lumbar three-four, Lumbar four-five Central Decompression, Lumbar Three-Four Microdiscectomhy;  Surgeon: [name], MD;  Location: [place] [facility];  Service: Orthopedics;  Laterality: N/A;  ? SHOULDER SURGERY Left 09/28/2013  ? fix previous clavicular fracture complication  ? ? ?Social History  ? ?Tobacco Use  ? Smoking status: Light Smoker  ?  Types: Cigars  ? Smokeless tobacco: Never  ? Tobacco comments:  ?  weekend / social  ?Vaping Use  ? Vaping Use: Never used  ?Substance Use Topics  ? Alcohol use: Not Currently  ?  Comment: social, every 1-2 months approx 1 drink per occasion  ? Drug use: Yes  ?  Types: Marijuana  ?  Comment: sometimes for pain  ? ? ?Family  History  ?Problem Relation Age of Onset  ? Thyroid cancer Mother   ? Heart attack Father   ? Colon cancer Neg Hx   ? Colon polyps Neg Hx   ? ? ?No Known Allergies ? ?Health Maintenance  ?Topic Date Due  ? COVID-19 Vaccine (1) Never done  ? HIV Screening  Never done  ? Hepatitis C Screening  Never done  ? TETANUS/TDAP  Never done  ? Zoster Vaccines- Shingrix (1 of 2) Never done  ? INFLUENZA VACCINE  Never done  ? COLONOSCOPY (Pts 45-62yr Insurance coverage will need to be confirmed)  11/18/2028  ? HPV VACCINES  Aged Out  ? ? ?Objective: ?BP (!) 136/94    Pulse (!) 101   Temp 98.8 ?F (37.1 ?C) (Oral)   Wt 240 lb (108.9 kg)   BMI 36.49 kg/m?  ? ? ?Physical Exam ?Constitutional:   ?   Appearance: Normal appearance.  ?HENT:  ?   Head: Normocephalic and atraumatic.   ?   Mouth: Mucous membranes are moist.  ?Eyes: ?   Conjunctiva/sclera: Conjunctivae normal.  ?   Pupils:  ? ?Cardiovascular:  ?   Rate and Rhythm: Normal rate and regular rhythm.  ?   Heart sounds: ? ?Pulmonary:  ?   Effort: Pulmonary effort is normal.  ?   Breath sounds: Normal breath sounds.  ? ?Abdominal:  ?   General: Non distended  ?   Palpations: soft.  ? ?Musculoskeletal:     ?   General: Normal range of motion.  ? ? ? ?Skin: ?   General: Skin is warm and dry.  ?   Comments: ? ?Neurological:  ?   General: grossly non focal  ?   Mental Status: awake, alert and oriented to person, place, and time.  ? ?Psychiatric:     ?   Mood and Affect: Mood normal.  ? ?Lab Results ?Lab Results  ?Component Value Date  ? WBC 12.3 (H) 12/17/2021  ? HGB 11.2 (L) 12/17/2021  ? HCT 34.5 (L) 12/17/2021  ? MCV 87.6 12/17/2021  ? PLT 536 (H) 12/17/2021  ?  ?Lab Results  ?Component Value Date  ? CREATININE 1.05 12/17/2021  ? BUN 18 12/17/2021  ? NA 133 (L) 12/17/2021  ? K 4.3 12/17/2021  ? CL 103 12/17/2021  ? CO2 21 (L) 12/17/2021  ?  ?Lab Results  ?Component Value Date  ? ALT 54 (H) 12/17/2021  ? AST 32 12/17/2021  ? ALKPHOS 110 12/17/2021  ? BILITOT 0.5 12/17/2021  ?  ?No results found for: CHOL, HDL, LDLCALC, LDLDIRECT, TRIG, CHOLHDL ?No results found for: LABRPR, RPRTITER ?No results found for: HIV1RNAQUANT, HIV1RNAVL, CD4TABS ? ?Problem List Items Addressed This Visit   ? ?  ? Cardiovascular and Mediastinum  ? DVT (deep venous thrombosis) ([place]  ?  ? Other  ? Surgical site infection - Primary  ? Medication monitoring encounter  ? ? ?Assessment ?# Lumbar surgical site infection/deep to dural space with dural leak s/p  Lumbar exploration L3-L5 decompression with dural repair 3/15. OR cx ( MRSE, MS staph lugdunensis,  strep agalactiae) ?- 11/26/21:  L3-4, L4-5 decompression.  Near complete laminectomy L3, completed L4, complete laminectomy L5.  Repair of 3 mm Intra-Op durotomy with primary repair sutures.  Right L3-4 microdiscectomy removal of extruded fragment ?  ?# Medication Monitoring ?# RT leg DVT - on AC , fu with PCP/surgery  ? ?Plan ?DC linezolid from today, has completed 2 weeks of tx from last  OR and wound has healed  ?Will not do CBC as stopping linezolid ?Fu in 2 weeks for wound check off abtx ?Fu with Dr Lorin Mercy  ? ?I have personally spent 45 minutes involved in face-to-face and non-face-to-face activities for this patient on the day of the visit. Professional time spent includes the following activities: Preparing to see the patient (review of tests), Obtaining and/or reviewing separately obtained history (admission/discharge record), Performing a medically appropriate examination and/or evaluation , Ordering medications/tests/procedures, referring and communicating with other health care professionals, Documenting clinical information in the EMR, Independently interpreting results (not separately reported), Communicating results to the patient/family/caregiver, Counseling and educating the patient/family/caregiver and Care coordination (not separately reported).  ? ?Wilber Oliphant, MD ?[facility] ?[facility] ?12/23/2021, 9:08 AM ? ?

## 2021-12-25 ENCOUNTER — Encounter: Payer: Self-pay | Admitting: Orthopaedic Surgery

## 2021-12-25 ENCOUNTER — Ambulatory Visit (INDEPENDENT_AMBULATORY_CARE_PROVIDER_SITE_OTHER): Payer: BC Managed Care – PPO | Admitting: Orthopaedic Surgery

## 2021-12-25 VITALS — Ht 68.0 in | Wt 240.0 lb

## 2021-12-25 DIAGNOSIS — I82401 Acute embolism and thrombosis of unspecified deep veins of right lower extremity: Secondary | ICD-10-CM

## 2021-12-25 DIAGNOSIS — Z9889 Other specified postprocedural states: Secondary | ICD-10-CM

## 2021-12-25 NOTE — Progress Notes (Addendum)
? ?Post-Op Visit Note ?  ?Patient: Brandon Burton           ?Date of Birth: [birth year]           ?MRN: [id number] ?Visit Date: 12/25/2021 ?PCP: Asencion Noble, MD ? ? ?Assessment & Plan: Postop lumbar reexploration with repair of previous dural repair.  Incision is dry.  No drainage on his dressings at the wife's been changing daily. ? ?Patient saw Dr. West Bali who stopped his antibiotics.  He remains on Xarelto 20 mg daily.  We put a new Mepilex dressing on today Will return in 1 week for nurse visit for suture removal.  Continue daily walking continue Xarelto for the DVT that occurred when he was at bedrest after reexploration and dural repair.  Patient had bloody drainage from his incision immediately postop and he was not put on chemical DVT prophylaxis due to his lumbar spinal surgery and risks of hematoma, extradural compression etc.  Patient doing well no calf swelling.  Pain is good daily walking is increasing.  Nurse visit next week for suture removal I will recheck him myself in 3 weeks. ? ?Chief Complaint:  ?Chief Complaint  ?Patient presents with  ? Lower Back - Routine Post Op  ?  12/10/2021 Lumbar Re-Exploration ?11/26/2021 L3-4, L4-5 central decompression, L3-4 microdiscectomy  ? ?Visit Diagnoses:  ?1. Status post lumbar spine surgery for decompression of spinal cord   ?2. Deep vein thrombosis (DVT) of right lower extremity, unspecified chronicity, unspecified vein (HCC)   ? ? ?Plan: Nurse visit 1 week for suture removal.  Follow-up with me 2 weeks after that.  He will continue over the Xarelto for at least 6 weeks from his surgery possibly 3 months. ? ?Follow-Up Instructions: Return in about 1 week (around 01/01/2022).  ? ?Orders:  ?No orders of the defined types were placed in this encounter. ? ?No orders of the defined types were placed in this encounter. ? ? ?Imaging: ?No results found. ? ?PMFS History: ?Patient Active Problem List  ? Diagnosis Date Noted  ? Surgical site infection 12/23/2021  ? Medication  monitoring encounter 12/23/2021  ? DVT (deep venous thrombosis) ([place]) 12/17/2021  ? Wound drainage 12/10/2021  ? Status post lumbar spine surgery for decompression of spinal cord 12/09/2021  ? Postoperative CSF leak 12/09/2021  ? Lumbar stenosis 11/26/2021  ? Spinal stenosis of lumbar region 07/04/2021  ? Lumbar disc herniation with radiculopathy 07/04/2021  ? History of colonic polyps 08/16/2018  ? GERD (gastroesophageal reflux disease) 07/08/2015  ? Rectal bleeding 07/08/2015  ? ANKLE SPRAIN 09/10/2009  ? CLOSED FRACTURE OF CALCANEUS 08/27/2009  ? SHOULDER STRAIN 02/27/2009  ? ?Past Medical History:  ?Diagnosis Date  ? GERD (gastroesophageal reflux disease)   ? Hypertension   ?  ?Family History  ?Problem Relation Age of Onset  ? Thyroid cancer Mother   ? Heart attack Father   ? Colon cancer Neg Hx   ? Colon polyps Neg Hx   ?  ?Past Surgical History:  ?Procedure Laterality Date  ? COLONOSCOPY N/A 07/22/2015  ? SLF:7 polyps removed/mild diverticulosis/moderate internal hemorrhoids  ? COLONOSCOPY N/A 11/18/2018  ? Procedure: COLONOSCOPY;  Surgeon: Danie Binder, MD;  [facility];  Service: Endoscopy;  Laterality: N/A;  2:15pm  ? EYE SURGERY    ? LUMBAR LAMINECTOMY N/A 12/10/2021  ? Procedure: LUMBAR RE-EXPLORATION;  Surgeon: [name], MD;  Location: [place];  Service: Orthopedics;  Laterality: N/A;  ? LUMBAR LAMINECTOMY/DECOMPRESSION MICRODISCECTOMY N/A 11/26/2021  ?  Procedure: Lumbar three-four, Lumbar four-five Central Decompression, Lumbar Three-Four Microdiscectomhy;  Surgeon: [name], MD;  Location: [place];  Service: Orthopedics;  Laterality: N/A;  ? SHOULDER SURGERY Left 09/28/2013  ? fix previous clavicular fracture complication  ? ?Social History  ? ?Occupational History  ? Not on file  ?Tobacco Use  ? Smoking status: Light Smoker  ?  Types: Cigars  ? Smokeless tobacco: Never  ? Tobacco comments:  ?  weekend / social  ?Vaping Use  ? Vaping Use: Never used  ?Substance and Sexual Activity  ?  Alcohol use: Not Currently  ?  Comment: social, every 1-2 months approx 1 drink per occasion  ? Drug use: Yes  ?  Types: Marijuana  ?  Comment: sometimes for pain  ? Sexual activity: Not on file  ? ? ? ?

## 2022-01-06 ENCOUNTER — Ambulatory Visit: Payer: BC Managed Care – PPO | Admitting: Infectious Diseases

## 2022-01-06 ENCOUNTER — Inpatient Hospital Stay: Payer: BC Managed Care – PPO | Admitting: Infectious Diseases

## 2022-01-08 ENCOUNTER — Ambulatory Visit (INDEPENDENT_AMBULATORY_CARE_PROVIDER_SITE_OTHER): Payer: BC Managed Care – PPO | Admitting: Orthopaedic Surgery

## 2022-01-08 ENCOUNTER — Encounter: Payer: Self-pay | Admitting: Orthopaedic Surgery

## 2022-01-08 ENCOUNTER — Telehealth: Payer: Self-pay | Admitting: Radiology

## 2022-01-08 VITALS — Ht 68.0 in | Wt 240.0 lb

## 2022-01-08 DIAGNOSIS — G9782 Other postprocedural complications and disorders of nervous system: Secondary | ICD-10-CM

## 2022-01-08 DIAGNOSIS — G9600 Cerebrospinal fluid leak, unspecified: Secondary | ICD-10-CM

## 2022-01-08 DIAGNOSIS — Z9889 Other specified postprocedural states: Secondary | ICD-10-CM

## 2022-01-08 NOTE — Addendum Note (Signed)
Addended by: Meyer Cory on: 01/08/2022 11:30 AM ? ? Modules accepted: Orders ? ?

## 2022-01-08 NOTE — Telephone Encounter (Signed)
Notes from 12/20/21 forward, out of work note, and OP note faxed to [facility] per patient request. FMLA form emailed to [employer] at [email or web address]. ?

## 2022-01-08 NOTE — Progress Notes (Signed)
? ?Post-Op Visit Note ?  ?Patient: Brandon Burton           ?Date of Birth: [birth year]           ?MRN: [id number] ?Visit Date: 01/08/2022 ?PCP: Asencion Noble, MD ? ? ?Assessment & Plan: Post two-level lumbar decompression reexploration dural repair.  Postop DVT right leg.  He is on Xarelto and needs a Doppler test [facility] next week to evaluate right lower extremity DVT.  He has about a weeks left of the Xarelto.  Work slip given no work x1 month.  Previously was scheduled to return to work May 11.  He will stay off ibuprofen he can use plain Tylenol.  He is not taking any narcotic medicine for pain is walking daily but notices his legs still feel weak but notices improvement weekly in his strength. ? ?Chief Complaint:  ?Chief Complaint  ?Patient presents with  ? Lower Back - Follow-up  ?  12/10/2021 Lumbar Re-Exploration ?11/26/2021 L3-4, L4-5 central decompression, L3-4 microdiscectomy  ? ?Visit Diagnoses:  ?1. Status post lumbar spine surgery for decompression of spinal cord   ?2. Postoperative CSF leak   ? ? ?Plan: ?Stop for next week and any 10.  Recheck 3 weeks.  Work note given no work x1 month. ? ?Follow-Up Instructions: Return in about 3 weeks (around 01/29/2022).  ? ?Orders:  ?No orders of the defined types were placed in this encounter. ? ?No orders of the defined types were placed in this encounter. ? ? ?Imaging: ?No results found. ? ?PMFS History: ?Patient Active Problem List  ? Diagnosis Date Noted  ? Surgical site infection 12/23/2021  ? Medication monitoring encounter 12/23/2021  ? DVT (deep venous thrombosis) ([place]) 12/17/2021  ? Wound drainage 12/10/2021  ? Status post lumbar spine surgery for decompression of spinal cord 12/09/2021  ? Postoperative CSF leak 12/09/2021  ? Lumbar stenosis 11/26/2021  ? Spinal stenosis of lumbar region 07/04/2021  ? History of colonic polyps 08/16/2018  ? GERD (gastroesophageal reflux disease) 07/08/2015  ? Rectal bleeding 07/08/2015  ? ANKLE SPRAIN 09/10/2009  ?  CLOSED FRACTURE OF CALCANEUS 08/27/2009  ? SHOULDER STRAIN 02/27/2009  ? ?Past Medical History:  ?Diagnosis Date  ? GERD (gastroesophageal reflux disease)   ? Hypertension   ?  ?Family History  ?Problem Relation Age of Onset  ? Thyroid cancer Mother   ? Heart attack Father   ? Colon cancer Neg Hx   ? Colon polyps Neg Hx   ?  ?Past Surgical History:  ?Procedure Laterality Date  ? COLONOSCOPY N/A 07/22/2015  ? SLF:7 polyps removed/mild diverticulosis/moderate internal hemorrhoids  ? COLONOSCOPY N/A 11/18/2018  ? Procedure: COLONOSCOPY;  Surgeon: Danie Binder, MD;  [facility];  Service: Endoscopy;  Laterality: N/A;  2:15pm  ? EYE SURGERY    ? LUMBAR LAMINECTOMY N/A 12/10/2021  ? Procedure: LUMBAR RE-EXPLORATION;  Surgeon: [name], MD;  Location: [place];  Service: Orthopedics;  Laterality: N/A;  ? LUMBAR LAMINECTOMY/DECOMPRESSION MICRODISCECTOMY N/A 11/26/2021  ? Procedure: Lumbar three-four, Lumbar four-five Central Decompression, Lumbar Three-Four Microdiscectomhy;  Surgeon: [name], MD;  Location: [place];  Service: Orthopedics;  Laterality: N/A;  ? SHOULDER SURGERY Left 09/28/2013  ? fix previous clavicular fracture complication  ? ?Social History  ? ?Occupational History  ? Not on file  ?Tobacco Use  ? Smoking status: Light Smoker  ?  Types: Cigars  ? Smokeless tobacco: Never  ? Tobacco comments:  ?  weekend / social  ?  Vaping Use  ? Vaping Use: Never used  ?Substance and Sexual Activity  ? Alcohol use: Not Currently  ?  Comment: social, every 1-2 months approx 1 drink per occasion  ? Drug use: Yes  ?  Types: Marijuana  ?  Comment: sometimes for pain  ? Sexual activity: Not on file  ? ? ? ?

## 2022-01-16 ENCOUNTER — Ambulatory Visit (HOSPITAL_COMMUNITY)
Admission: RE | Admit: 2022-01-16 | Discharge: 2022-01-16 | Disposition: A | Discharge status: Home / Self Care | Payer: BC Managed Care – PPO | Source: Ambulatory Visit | Attending: Orthopaedic Surgery | Admitting: Orthopaedic Surgery

## 2022-01-16 DIAGNOSIS — I82411 Acute embolism and thrombosis of right femoral vein: Secondary | ICD-10-CM | POA: Diagnosis not present

## 2022-01-16 DIAGNOSIS — G9600 Cerebrospinal fluid leak, unspecified: Secondary | ICD-10-CM | POA: Diagnosis not present

## 2022-01-16 DIAGNOSIS — Z9889 Other specified postprocedural states: Secondary | ICD-10-CM | POA: Diagnosis not present

## 2022-01-16 DIAGNOSIS — G9782 Other postprocedural complications and disorders of nervous system: Secondary | ICD-10-CM | POA: Insufficient documentation

## 2022-01-19 ENCOUNTER — Other Ambulatory Visit: Payer: Self-pay

## 2022-01-19 ENCOUNTER — Other Ambulatory Visit: Payer: Self-pay | Admitting: Orthopaedic Surgery

## 2022-01-19 MED ORDER — RIVAROXABAN 20 MG PO TABS: 20.0000 mg | ORAL_TABLET | Freq: Every day | ORAL | Status: DC

## 2022-01-19 NOTE — Progress Notes (Signed)
Tried calling patient to advise per Dr Lorin Mercy still as partial clot and refill on blood thinners sent to pharmacy. No answer. Dr Lorin Mercy stated he was able to reach patient.  ?

## 2022-01-20 ENCOUNTER — Other Ambulatory Visit: Payer: Self-pay | Admitting: Orthopaedic Surgery

## 2022-02-11 ENCOUNTER — Ambulatory Visit (INDEPENDENT_AMBULATORY_CARE_PROVIDER_SITE_OTHER): Payer: BC Managed Care – PPO

## 2022-02-11 ENCOUNTER — Ambulatory Visit: Payer: BC Managed Care – PPO | Admitting: Orthopedic Surgery

## 2022-02-11 DIAGNOSIS — M25562 Pain in left knee: Secondary | ICD-10-CM

## 2022-02-11 DIAGNOSIS — M25561 Pain in right knee: Secondary | ICD-10-CM

## 2022-02-11 DIAGNOSIS — G8929 Other chronic pain: Secondary | ICD-10-CM

## 2022-02-11 NOTE — Patient Instructions (Signed)
You have received an injection of steroids into the joint. 15% of patients will have increased pain within the 24 hours postinjection.  ? ?This is transient and will go away.  ? ?We recommend that you use ice packs on the injection site for 20 minutes every 2 hours and extra strength Tylenol 2 tablets every 8 as needed until the pain resolves. ? ?If you continue to have pain after taking the Tylenol and using the ice please call the office for further instructions. ? ? ? ?DICLOFENAC gel  ?

## 2022-02-11 NOTE — Progress Notes (Signed)
Chief Complaint  ?Patient presents with  ? Knee Pain  ?  Bilateral/ the left one is hurting really bad. Want to see if it has any fluid in it and maybe get an injection.  ? ?56 year old male status post recent lumbar disc surgery chronic left knee pain comes in with bilateral knee pain.  He developed a blood clot after his back surgery and then he had to be taken off his anti-inflammatories his knee pain increased after that no recent trauma ? ?X-rays today show no progression of his arthritic disease ? ?He has no fluid on his knee he has tenderness on the medial lateral joint lines knee is otherwise stable ? ?I think he will do well with a cortisone shot and some topical diclofenac gel ? ?Follow-up as needed ? ?Procedure note left knee injection  ? ?verbal consent was obtained to inject left knee joint ? ?Timeout was completed to confirm the site of injection ? ?The medications used were depomedrol 40 mg and 1% lidocaine 3 cc ?Anesthesia was provided by ethyl chloride and the skin was prepped with alcohol. ? ?After cleaning the skin with alcohol a 20-gauge needle was used to inject the left knee joint. There were no complications. A sterile bandage was applied. ? ? ?Xrays  ?

## 2022-02-12 ENCOUNTER — Encounter: Payer: Self-pay | Admitting: Orthopaedic Surgery

## 2022-02-12 ENCOUNTER — Ambulatory Visit (INDEPENDENT_AMBULATORY_CARE_PROVIDER_SITE_OTHER): Payer: BC Managed Care – PPO | Admitting: Orthopaedic Surgery

## 2022-02-12 VITALS — Ht 68.0 in | Wt 240.0 lb

## 2022-02-12 DIAGNOSIS — Z9889 Other specified postprocedural states: Secondary | ICD-10-CM

## 2022-02-12 NOTE — Progress Notes (Signed)
Post-Op Visit Note   Patient: Brandon Burton           Date of Birth: [birth year]           MRN: [id number] Visit Date: 02/12/2022 PCP: Asencion Noble, MD   Assessment & Plan: Postop two-level lumbar decompression with microdiscectomy left L3-4.  Intra-Op dural tear repaired and then later increased leakage had to have reexploration 2 weeks later on 12/10/2021.  He has done well postop.  He states he has not gotten the strength back for full work resumption at this point and has gotten good relief of his claudication symptoms.  Sensation legs are intact.  He can heel walk but feels like he still has some weakness in his cast with toe walking as he had preop.  Recent injection in his left knee by Dr. Aline Brochure.  He remains out of work.  Work slip given no work x6 weeks recheck 4 weeks.  Physical therapy [facility] for lower extremity strengthening.  Recheck 4 weeks and we can discuss picking a date for work resumption.  Chief Complaint:  Chief Complaint  Patient presents with   Lower Back - Follow-up    12/10/2021 Lumbar Re-exploration 11/26/2021 L3-4, L4-5 central decompression, L3-4 microdiscectomy   Visit Diagnoses:  1. Status post lumbar spine surgery for decompression of spinal cord     Plan: Outpatient therapy recheck 4 weeks.  Work slip given no work x6 weeks.  Follow-Up Instructions: Return in about 4 weeks (around 03/12/2022).   Orders:  Orders Placed This Encounter  Procedures   Ambulatory referral to Physical Therapy   No orders of the defined types were placed in this encounter.   Imaging: No results found.  PMFS History: Patient Active Problem List   Diagnosis Date Noted   Surgical site infection 12/23/2021   Medication monitoring encounter 12/23/2021   DVT (deep venous thrombosis) ([place]) 12/17/2021   Wound drainage 12/10/2021   Status post lumbar spine surgery for decompression of spinal cord 12/09/2021   Postoperative CSF leak 12/09/2021   Lumbar stenosis  11/26/2021   Spinal stenosis of lumbar region 07/04/2021   History of colonic polyps 08/16/2018   GERD (gastroesophageal reflux disease) 07/08/2015   Rectal bleeding 07/08/2015   ANKLE SPRAIN 09/10/2009   CLOSED FRACTURE OF CALCANEUS 08/27/2009   SHOULDER STRAIN 02/27/2009   Past Medical History:  Diagnosis Date   GERD (gastroesophageal reflux disease)    Hypertension     Family History  Problem Relation Age of Onset   Thyroid cancer Mother    Heart attack Father    Colon cancer Neg Hx    Colon polyps Neg Hx     Past Surgical History:  Procedure Laterality Date   COLONOSCOPY N/A 07/22/2015   SLF:7 polyps removed/mild diverticulosis/moderate internal hemorrhoids   COLONOSCOPY N/A 11/18/2018   Procedure: COLONOSCOPY;  Surgeon: Danie Binder, MD;  [facility];  Service: Endoscopy;  Laterality: N/A;  2:15pm   EYE SURGERY     LUMBAR LAMINECTOMY N/A 12/10/2021   Procedure: LUMBAR RE-EXPLORATION;  Surgeon: [name], MD;  Location: [place];  Service: Orthopedics;  Laterality: N/A;   LUMBAR LAMINECTOMY/DECOMPRESSION MICRODISCECTOMY N/A 11/26/2021   Procedure: Lumbar three-four, Lumbar four-five Central Decompression, Lumbar Three-Four Microdiscectomhy;  Surgeon: [name], MD;  Location: [place];  Service: Orthopedics;  Laterality: N/A;   SHOULDER SURGERY Left 09/28/2013   fix previous clavicular fracture complication   Social History   Occupational History   Not on  file  Tobacco Use   Smoking status: Light Smoker    Types: Cigars   Smokeless tobacco: Never   Tobacco comments:    weekend / social  Vaping Use   Vaping Use: Never used  Substance and Sexual Activity   Alcohol use: Not Currently    Comment: social, every 1-2 months approx 1 drink per occasion   Drug use: Yes    Types: Marijuana    Comment: sometimes for pain   Sexual activity: Not on file

## 2022-02-12 NOTE — Progress Notes (Deleted)
Office Visit Note   Patient: Brandon Burton           Date of Birth: 1966/07/06           MRN: [id number] Visit Date: 02/12/2022              Requested by: Asencion Noble, MD [address] [place],  [place] [address] PCP: Asencion Noble, MD   Assessment & Plan: Visit Diagnoses: No diagnosis found.  Plan: ***  Follow-Up Instructions: No follow-ups on file.   Orders:  No orders of the defined types were placed in this encounter.  No orders of the defined types were placed in this encounter.     Procedures: No procedures performed   Clinical Data: No additional findings.   Subjective: Chief Complaint  Patient presents with   Lower Back - Follow-up    12/10/2021 Lumbar Re-exploration 11/26/2021 L3-4, L4-5 central decompression, L3-4 microdiscectomy    HPI  Review of Systems   Objective: Vital Signs: Ht '5\' 8"'$  (1.727 m)   Wt 240 lb (108.9 kg)   BMI 36.49 kg/m   Physical Exam  Ortho Exam  Specialty Comments:  No specialty comments available.  Imaging: No results found.   PMFS History: Patient Active Problem List   Diagnosis Date Noted   Surgical site infection 12/23/2021   Medication monitoring encounter 12/23/2021   DVT (deep venous thrombosis) ([place]) 12/17/2021   Wound drainage 12/10/2021   Status post lumbar spine surgery for decompression of spinal cord 12/09/2021   Postoperative CSF leak 12/09/2021   Lumbar stenosis 11/26/2021   Spinal stenosis of lumbar region 07/04/2021   History of colonic polyps 08/16/2018   GERD (gastroesophageal reflux disease) 07/08/2015   Rectal bleeding 07/08/2015   ANKLE SPRAIN 09/10/2009   CLOSED FRACTURE OF CALCANEUS 08/27/2009   SHOULDER STRAIN 02/27/2009   Past Medical History:  Diagnosis Date   GERD (gastroesophageal reflux disease)    Hypertension     Family History  Problem Relation Age of Onset   Thyroid cancer Mother    Heart attack Father    Colon cancer Neg Hx    Colon polyps Neg Hx     Past  Surgical History:  Procedure Laterality Date   COLONOSCOPY N/A 07/22/2015   SLF:7 polyps removed/mild diverticulosis/moderate internal hemorrhoids   COLONOSCOPY N/A 11/18/2018   Procedure: COLONOSCOPY;  Surgeon: Danie Binder, MD;  [facility];  Service: Endoscopy;  Laterality: N/A;  2:15pm   EYE SURGERY     LUMBAR LAMINECTOMY N/A 12/10/2021   Procedure: LUMBAR RE-EXPLORATION;  Surgeon: [name], MD;  Location: [facility] [place];  Service: Orthopedics;  Laterality: N/A;   LUMBAR LAMINECTOMY/DECOMPRESSION MICRODISCECTOMY N/A 11/26/2021   Procedure: Lumbar three-four, Lumbar four-five Central Decompression, Lumbar Three-Four Microdiscectomhy;  Surgeon: [name], MD;  Location: [place];  Service: Orthopedics;  Laterality: N/A;   SHOULDER SURGERY Left 09/28/2013   fix previous clavicular fracture complication   Social History   Occupational History   Not on file  Tobacco Use   Smoking status: Light Smoker    Types: Cigars   Smokeless tobacco: Never   Tobacco comments:    weekend / social  Vaping Use   Vaping Use: Never used  Substance and Sexual Activity   Alcohol use: Not Currently    Comment: social, every 1-2 months approx 1 drink per occasion   Drug use: Yes    Types: Marijuana    Comment: sometimes for pain   Sexual activity: Not  on file

## 2022-02-26 ENCOUNTER — Other Ambulatory Visit: Payer: Self-pay | Admitting: Orthopaedic Surgery

## 2022-02-26 MED ORDER — TRAMADOL HCL 50 MG PO TABS: 50.0000 mg | ORAL_TABLET | Freq: Two times a day (BID) | ORAL | 0 refills | Status: DC | PRN

## 2022-03-09 NOTE — Therapy (Signed)
OUTPATIENT PHYSICAL THERAPY THORACOLUMBAR EVALUATION   Patient Name: Brandon Burton MRN: [id number] DOB:[birth year], 56 y.o., male Today's Date: 03/10/2022   PT End of Session - 03/10/22 1126     Visit Number 1    Number of Visits 8    Date for PT Re-Evaluation 04/07/22    Authorization Type [employer] PPO; 71 visits PT OT combined; no authorization; copay $20.00; no dede    Authorization - Visit Number 60    Progress Note Due on Visit 8    PT Start Time 1120    PT Stop Time 1200    PT Time Calculation (min) 40 min             Past Medical History:  Diagnosis Date   GERD (gastroesophageal reflux disease)    Hypertension    Past Surgical History:  Procedure Laterality Date   COLONOSCOPY N/A 07/22/2015   SLF:7 polyps removed/mild diverticulosis/moderate internal hemorrhoids   COLONOSCOPY N/A 11/18/2018   Procedure: COLONOSCOPY;  Surgeon: Danie Binder, MD;  [facility];  Service: Endoscopy;  Laterality: N/A;  2:15pm   EYE SURGERY     LUMBAR LAMINECTOMY N/A 12/10/2021   Procedure: LUMBAR RE-EXPLORATION;  Surgeon: [name], MD;  Location: [place];  Service: Orthopedics;  Laterality: N/A;   LUMBAR LAMINECTOMY/DECOMPRESSION MICRODISCECTOMY N/A 11/26/2021   Procedure: Lumbar three-four, Lumbar four-five Central Decompression, Lumbar Three-Four Microdiscectomhy;  Surgeon: [name], MD;  Location: [facility];  Service: Orthopedics;  Laterality: N/A;   SHOULDER SURGERY Left 09/28/2013   fix previous clavicular fracture complication   Patient Active Problem List   Diagnosis Date Noted   Surgical site infection 12/23/2021   Medication monitoring encounter 12/23/2021   DVT (deep venous thrombosis) ([place]) 12/17/2021   Wound drainage 12/10/2021   Status post lumbar spine surgery for decompression of spinal cord 12/09/2021   Postoperative CSF leak 12/09/2021   Lumbar stenosis 11/26/2021   Spinal stenosis of lumbar region 07/04/2021   History of colonic polyps 08/16/2018    GERD (gastroesophageal reflux disease) 07/08/2015   Rectal bleeding 07/08/2015   ANKLE SPRAIN 09/10/2009   CLOSED FRACTURE OF CALCANEUS 08/27/2009   SHOULDER STRAIN 02/27/2009    PCP: Asencion Noble, MD  REFERRING PROVIDER: [name], MD   REFERRING DIAG: [phone] (ICD-10-CM) - Status post lumbar spine surgery for decompression of spinal cord   Rationale for Evaluation and Treatment Rehabilitation  THERAPY DIAG:  Low back pain, unspecified back pain laterality, unspecified chronicity, unspecified whether sciatica present  Muscle weakness (generalized)  Difficulty in walking, not elsewhere classified  ONSET DATE: 12/10/2021  SUBJECTIVE:  SUBJECTIVE STATEMENT: Over the last couple of years; was having increasing numbness in legs. Had surgery to decompress spine 11/28/2021 and then 12/10/2021 per Dr. Lorin Mercy.  Reports weakness in his legs since surgery. Wants to be able to run with his granddaughter; play softball PERTINENT HISTORY:  Also sees Dr. Aline Brochure for both shoulders ( torn RTC bilaterally) and OA in both knees left knee more than right.. on blood thinners so unable to take arthritis medications. Has an active blood clot in right thigh  PAIN:  Are you having pain? Yes: NPRS scale: 7/10 Pain location: left shoulder and knees Pain description: aching; sore Aggravating factors: activity Relieving factors: rest   PRECAUTIONS: Back  WEIGHT BEARING RESTRICTIONS No  FALLS:  Has patient fallen in last 6 months? No  LIVING ENVIRONMENT: Lives with: lives with their spouse Lives in: House/apartment Stairs: Yes: External: 3 steps; on right going up, on left going up, and can reach both Has following equipment at home: Gilford Rile - 2 wheeled  OCCUPATION: works at [employer]; Retail buyer; Pensions consultant;  have to climb steps and ladders and walk quite a distance.   PLOF: Independent  PATIENT GOALS get my legs stronger to try to go back to work   OBJECTIVE:   DIAGNOSTIC FINDINGS:   05/05/2021  3 views lumbar    Right leg radicular pain and lower back pain    Scattered disc changes anterior osteophytes and facet arthritis truncal asymmetry on the coronal plane and decreased lumbar lordosis   Consistent with spondylosis lumbar spine  PATIENT SURVEYS:  FOTO 47    COGNITION:  Overall cognitive status: Within functional limits for tasks assessed     SENSATION: Not tested  POSTURE: decreased lumbar lordosis  PALPATION: Non tender around incision  LUMBAR ROM:   Active  AROM  % available eval  Flexion 60%   Extension 20%  Right lateral flexion 75%  Left lateral flexion 75%  Right rotation 75%  Left rotation 75%    (Blank rows = not tested)   LOWER EXTREMITY MMT:    MMT Right eval Left eval  Hip flexion 4 4  Hip extension 4- 4  Hip abduction    Hip adduction    Hip internal rotation    Hip external rotation    Knee flexion    Knee extension 4+ 4+  Ankle dorsiflexion 4+ 4+  Ankle plantarflexion    Ankle inversion    Ankle eversion     (Blank rows = not tested)    GAIT: Distance walked: 80 Assistive device utilized: None Level of assistance: Modified independence Comments: slight antalgic gait    TODAY'S TREATMENT  Physical therapy evaluation; HEP instruction   PATIENT EDUCATION:  Education details:HEP and plan of care Person educated: Patient Education method: Explanation Education comprehension: verbalized understanding, returned demonstration, and needs further education   HOME EXERCISE PROGRAM: Access Code: [facility] URL: [email or web address] Date: 03/10/2022 Prepared by: [facility]  Exercises - Supine Lower Trunk Rotation  - 2 x daily - 7 x weekly - 1 sets - 10 reps - Prone on Elbows Stretch  - 2 x daily - 7 x  weekly - 1 sets - 1 reps - 2 min hold - Supine Transversus Abdominis Bracing - Hands on Stomach  - 2 x daily - 7 x weekly - 1 sets - 10 reps  ASSESSMENT:  CLINICAL IMPRESSION: Patient is a 56 y.o. gentleman who was seen today for physical therapy evaluation and treatment for Post lumbar decompression for spinal  stenosis LE strengthening. He presents with decreased lumbar mobility and strength and lower extremity weakness that negatively impacts his ability to interact with his granddaughter, to play softball and to return to work.  Patient will benefit from skilled PT interventions to address low back pain, decreased lumbar mobility and core weakness, LE weakness, and the instruction, development and modification of HEP.   OBJECTIVE IMPAIRMENTS Abnormal gait, decreased activity tolerance, decreased endurance, decreased mobility, difficulty walking, decreased ROM, decreased strength, decreased safety awareness, hypomobility, impaired perceived functional ability, increased muscle spasms, impaired flexibility, impaired sensation, impaired tone, impaired UE functional use, impaired vision/preception, improper body mechanics, postural dysfunction, and pain.   ACTIVITY LIMITATIONS carrying, lifting, bending, sitting, standing, squatting, sleeping, stairs, transfers, reach over head, and locomotion level  PARTICIPATION LIMITATIONS: cleaning, laundry, community activity, occupation, and yard work  PERSONAL FACTORS Fitness are also affecting patient's functional outcome.   REHAB POTENTIAL: Good  CLINICAL DECISION MAKING: Stable/uncomplicated  EVALUATION COMPLEXITY: Low   GOALS: Goals reviewed with patient? No  SHORT TERM GOALS: Target date: 03/24/2022   patient will be independent with initial HEP   Baseline: Goal status: INITIAL  2.  Patient will demonstrate improved lumbar extension to 50% available to improve ability to reach overhead. Baseline: 20% available Goal status:  INITIAL    LONG TERM GOALS: Target date: 04/07/2022  Patient will report at least 50% improvement in overall symptoms and/or function to demonstrate improved functional mobility  Baseline:  Goal status: INITIAL  2.   Patient will improve FOTO score to predicted value to demonstrate improved functional mobility Baseline: 47 Goal status: INITIAL  3.   Patient will be independent with advanced HEP and self management strategies to improve quality of life and functional outcomes.  Baseline:  Goal status: INITIAL  4.  Patient will increase bilateral Lower extremity strength to 5/5 tested motions to improve ability to navigate steps and  to promote return to ambulation community distances with minimal deviation.  Baseline:  MMT Right eval Left eval  Hip flexion 4 4  Hip extension 4- 4  Hip abduction    Hip adduction    Hip internal rotation    Hip external rotation    Knee flexion    Knee extension 4+ 4+  Ankle dorsiflexion 4+ 4+   Goal status: INITIAL  5.  Patient will be able to stand/walk x 30 min to improve ability to walk longer distances for work.  Baseline:  Goal status: INITIAL   PLAN: PT FREQUENCY: 2x/week  PT DURATION: 4 weeks  PLANNED INTERVENTIONS: Therapeutic exercises, Therapeutic activity, Neuromuscular re-education, Balance training, Gait training, Patient/Family education, Joint manipulation, Joint mobilization, Stair training, Vestibular training, Canalith repositioning, Visual/preceptual remediation/compensation, Orthotic/Fit training, Prosthetic training, DME instructions, Aquatic Therapy, Dry Needling, Cognitive remediation, Electrical stimulation, Spinal manipulation, Spinal mobilization, Cryotherapy, Moist heat, Manual lymph drainage, Compression bandaging, scar mobilization, Splintting, Taping, Vasopneumatic device, Traction, Ultrasound, Parrafin, Fluidotherapy, Biofeedback, Ionotophoresis '4mg'$ /ml Dexamethasone, Manual therapy, and  Re-evaluation.  PLAN FOR NEXT SESSION: Review HEP and goals; progress lumbar mobility and core stabilization as able; lower extremity strengthening.    3:47 PM, 03/10/22 Jeremia Groot Small Magdeline Prange MPT [facility] [place] [phone]

## 2022-03-10 ENCOUNTER — Ambulatory Visit (HOSPITAL_COMMUNITY): Payer: BC Managed Care – PPO | Attending: Orthopaedic Surgery

## 2022-03-10 DIAGNOSIS — M6281 Muscle weakness (generalized): Secondary | ICD-10-CM | POA: Diagnosis not present

## 2022-03-10 DIAGNOSIS — M545 Low back pain, unspecified: Secondary | ICD-10-CM | POA: Diagnosis not present

## 2022-03-10 DIAGNOSIS — R262 Difficulty in walking, not elsewhere classified: Secondary | ICD-10-CM | POA: Diagnosis not present

## 2022-03-10 DIAGNOSIS — Z9889 Other specified postprocedural states: Secondary | ICD-10-CM | POA: Diagnosis not present

## 2022-03-12 ENCOUNTER — Encounter: Payer: Self-pay | Admitting: Orthopaedic Surgery

## 2022-03-12 ENCOUNTER — Ambulatory Visit (INDEPENDENT_AMBULATORY_CARE_PROVIDER_SITE_OTHER): Payer: BC Managed Care – PPO | Admitting: Orthopaedic Surgery

## 2022-03-12 VITALS — Ht 68.0 in | Wt 240.0 lb

## 2022-03-12 DIAGNOSIS — Z9889 Other specified postprocedural states: Secondary | ICD-10-CM

## 2022-03-12 DIAGNOSIS — I82401 Acute embolism and thrombosis of unspecified deep veins of right lower extremity: Secondary | ICD-10-CM

## 2022-03-12 DIAGNOSIS — G9600 Cerebrospinal fluid leak, unspecified: Secondary | ICD-10-CM

## 2022-03-12 DIAGNOSIS — G9782 Other postprocedural complications and disorders of nervous system: Secondary | ICD-10-CM

## 2022-03-12 NOTE — Addendum Note (Signed)
Addended by: Derek Mound A on: 03/12/2022 02:01 PM   Modules accepted: Orders

## 2022-03-12 NOTE — Progress Notes (Signed)
Post-Op Visit Note   Patient: Brandon Burton           Date of Birth: [birth year]           MRN: [id number] Visit Date: 03/12/2022 PCP: Asencion Noble, MD   Assessment & Plan: Postop decompression two-level lumbar for severe stenosis Intra-Op dural tear with repair later reexploration.  No CSF problems after that.  He then developed DVT been on Xarelto.  He is ready to get repeat Dopplers at [facility].  Work resumption date is July/fifth/2023.  I told him I will call him with the Doppler results we can decide if we can stop the Xarelto or needs to be continued. Therapy was ordered last month and he has only had 1 visit which was 2 days ago.  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  Recheck 1 month. Chief Complaint:  Chief Complaint  Patient presents with   Lower Back - Routine Post Op    Doing ok.   Visit Diagnoses:  1. Status post lumbar spine surgery for decompression of spinal cord   2. Postoperative CSF leak   3. Deep vein thrombosis (DVT) of right lower extremity, unspecified chronicity, unspecified vein (HCC)     Plan: Return 1 month.  Follow-Up Instructions: Return in about 1 month (around 04/11/2022).   Orders:  No orders of the defined types were placed in this encounter.  No orders of the defined types were placed in this encounter.   Imaging: No results found.  PMFS History: Patient Active Problem List   Diagnosis Date Noted   Surgical site infection 12/23/2021   Medication monitoring encounter 12/23/2021   DVT (deep venous thrombosis) ([place]) 12/17/2021   Wound drainage 12/10/2021   Status post lumbar spine surgery for decompression of spinal cord 12/09/2021   Postoperative CSF leak 12/09/2021   Lumbar stenosis 11/26/2021   Spinal stenosis of lumbar region 07/04/2021   History of colonic polyps 08/16/2018   GERD (gastroesophageal reflux disease) 07/08/2015   Rectal bleeding 07/08/2015   ANKLE  SPRAIN 09/10/2009   CLOSED FRACTURE OF CALCANEUS 08/27/2009   SHOULDER STRAIN 02/27/2009   Past Medical History:  Diagnosis Date   GERD (gastroesophageal reflux disease)    Hypertension     Family History  Problem Relation Age of Onset   Thyroid cancer Mother    Heart attack Father    Colon cancer Neg Hx    Colon polyps Neg Hx     Past Surgical History:  Procedure Laterality Date   COLONOSCOPY N/A 07/22/2015   SLF:7 polyps removed/mild diverticulosis/moderate internal hemorrhoids   COLONOSCOPY N/A 11/18/2018   Procedure: COLONOSCOPY;  Surgeon: Danie Binder, MD;  [facility];  Service: Endoscopy;  Laterality: N/A;  2:15pm   EYE SURGERY     LUMBAR LAMINECTOMY N/A 12/10/2021   Procedure: LUMBAR RE-EXPLORATION;  Surgeon: [name], MD;  Location: [place];  Service: Orthopedics;  Laterality: N/A;   LUMBAR LAMINECTOMY/DECOMPRESSION MICRODISCECTOMY N/A 11/26/2021   Procedure: Lumbar three-four, Lumbar four-five Central Decompression, Lumbar Three-Four Microdiscectomhy;  Surgeon: [name], MD;  Location: [place];  Service: Orthopedics;  Laterality: N/A;   SHOULDER SURGERY Left 09/28/2013   fix previous clavicular fracture complication   Social History   Occupational History   Not on file  Tobacco Use   Smoking status: Light Smoker    Types: Cigars   Smokeless tobacco: Never  Tobacco comments:    weekend / social  Vaping Use   Vaping Use: Never used  Substance and Sexual Activity   Alcohol use: Not Currently    Comment: social, every 1-2 months approx 1 drink per occasion   Drug use: Yes    Types: Marijuana    Comment: sometimes for pain   Sexual activity: Not on file

## 2022-03-23 ENCOUNTER — Ambulatory Visit (HOSPITAL_COMMUNITY)
Admission: RE | Admit: 2022-03-23 | Discharge: 2022-03-23 | Disposition: A | Discharge status: Home / Self Care | Payer: BC Managed Care – PPO | Source: Ambulatory Visit | Attending: Orthopaedic Surgery | Admitting: Orthopaedic Surgery

## 2022-03-23 DIAGNOSIS — I82401 Acute embolism and thrombosis of unspecified deep veins of right lower extremity: Secondary | ICD-10-CM | POA: Diagnosis not present

## 2022-03-23 DIAGNOSIS — I82411 Acute embolism and thrombosis of right femoral vein: Secondary | ICD-10-CM | POA: Diagnosis not present

## 2022-03-26 ENCOUNTER — Ambulatory Visit (HOSPITAL_COMMUNITY): Payer: BC Managed Care – PPO | Attending: Orthopaedic Surgery

## 2022-03-26 ENCOUNTER — Encounter (HOSPITAL_COMMUNITY): Payer: Self-pay

## 2022-03-26 DIAGNOSIS — M545 Low back pain, unspecified: Secondary | ICD-10-CM | POA: Diagnosis not present

## 2022-03-26 DIAGNOSIS — R262 Difficulty in walking, not elsewhere classified: Secondary | ICD-10-CM | POA: Diagnosis not present

## 2022-03-26 DIAGNOSIS — M6281 Muscle weakness (generalized): Secondary | ICD-10-CM | POA: Insufficient documentation

## 2022-03-26 NOTE — Therapy (Signed)
OUTPATIENT PHYSICAL THERAPY THORACOLUMBAR TREATMENT   Patient Name: Brandon Burton MRN: [id number] DOB:1965/09/30, 56 y.o., male Today's Date: 03/26/2022   PT End of Session - 03/26/22 1410     Visit Number 2    Number of Visits 8    Date for PT Re-Evaluation 04/07/22    Authorization Type [employer] PPO; 43 visits PT OT combined; no authorization; copay $20.00; no dede    Authorization - Visit Number 60    Progress Note Due on Visit 8    PT Start Time 2355    PT Stop Time 1443    PT Time Calculation (min) 40 min    Activity Tolerance Patient tolerated treatment well    Behavior During Therapy WFL for tasks assessed/performed              Past Medical History:  Diagnosis Date   GERD (gastroesophageal reflux disease)    Hypertension    Past Surgical History:  Procedure Laterality Date   COLONOSCOPY N/A 07/22/2015   SLF:7 polyps removed/mild diverticulosis/moderate internal hemorrhoids   COLONOSCOPY N/A 11/18/2018   Procedure: COLONOSCOPY;  Surgeon: Danie Binder, MD;  [facility];  Service: Endoscopy;  Laterality: N/A;  2:15pm   EYE SURGERY     LUMBAR LAMINECTOMY N/A 12/10/2021   Procedure: LUMBAR RE-EXPLORATION;  Surgeon: [name], MD;  Location: [place];  Service: Orthopedics;  Laterality: N/A;   LUMBAR LAMINECTOMY/DECOMPRESSION MICRODISCECTOMY N/A 11/26/2021   Procedure: Lumbar three-four, Lumbar four-five Central Decompression, Lumbar Three-Four Microdiscectomhy;  Surgeon: [name], MD;  Location: [place];  Service: Orthopedics;  Laterality: N/A;   SHOULDER SURGERY Left 09/28/2013   fix previous clavicular fracture complication   Patient Active Problem List   Diagnosis Date Noted   Surgical site infection 12/23/2021   Medication monitoring encounter 12/23/2021   DVT (deep venous thrombosis) ([place]) 12/17/2021   Wound drainage 12/10/2021   Status post lumbar spine surgery for decompression of spinal cord 12/09/2021   Postoperative CSF leak  12/09/2021   Lumbar stenosis 11/26/2021   Spinal stenosis of lumbar region 07/04/2021   History of colonic polyps 08/16/2018   GERD (gastroesophageal reflux disease) 07/08/2015   Rectal bleeding 07/08/2015   ANKLE SPRAIN 09/10/2009   CLOSED FRACTURE OF CALCANEUS 08/27/2009   SHOULDER STRAIN 02/27/2009    PCP: Asencion Noble, MD  REFERRING PROVIDER: [name], MD   REFERRING DIAG: [phone] (ICD-10-CM) - Status post lumbar spine surgery for decompression of spinal cord   Rationale for Evaluation and Treatment Rehabilitation  THERAPY DIAG:  Low back pain, unspecified back pain laterality, unspecified chronicity, unspecified whether sciatica present  Muscle weakness (generalized)  Difficulty in walking, not elsewhere classified  ONSET DATE: 12/10/2021  SUBJECTIVE:  SUBJECTIVE STATEMENT:   03/26/22:  Pt reports he feels stiffness and weakness following 6 months of rest.  Stated MD stated he could begin BLT activities and exercise.  Reports he is supposed to RTW next week.  Feels the job may impact his ability to complete therapy.  Reports work required 1,800 steps to get into work.  Feels the arthritis in his knees keep him from beginning a walking program.  Reports he has began HEP without questions.  Feels he can walk for a mile currently prior fatigue, feels his stride is altered.    Eval subjective:Over the last couple of years; was having increasing numbness in legs. Had surgery to decompress spine 11/28/2021 and then 12/10/2021 per Dr. Lorin Mercy.  Reports weakness in his legs since surgery. Wants to be able to run with his granddaughter; play softball PERTINENT HISTORY:  Also sees Dr. Aline Brochure for both shoulders ( torn RTC bilaterally) and OA in both knees left knee more than right.. on blood thinners so  unable to take arthritis medications. Has an active blood clot in right thigh  PAIN:  Are you having pain? Yes: NPRS scale: 7/10 Pain location: left shoulder and knees Pain description: aching; sore Aggravating factors: activity Relieving factors: rest   PRECAUTIONS: Back  WEIGHT BEARING RESTRICTIONS No  FALLS:  Has patient fallen in last 6 months? No  LIVING ENVIRONMENT: Lives with: lives with their spouse Lives in: House/apartment Stairs: Yes: External: 3 steps; on right going up, on left going up, and can reach both Has following equipment at home: Gilford Rile - 2 wheeled  OCCUPATION: works at [employer]; Retail buyer; Pensions consultant; have to climb steps and ladders and walk quite a distance.   PLOF: Independent  PATIENT GOALS get my legs stronger to try to go back to work   OBJECTIVE:   DIAGNOSTIC FINDINGS:   05/05/2021  3 views lumbar    Right leg radicular pain and lower back pain    Scattered disc changes anterior osteophytes and facet arthritis truncal asymmetry on the coronal plane and decreased lumbar lordosis   Consistent with spondylosis lumbar spine  PATIENT SURVEYS:  FOTO 47    COGNITION:  Overall cognitive status: Within functional limits for tasks assessed     SENSATION: Not tested  POSTURE: decreased lumbar lordosis  PALPATION: Non tender around incision  LUMBAR ROM:   Active  AROM  % available eval  Flexion 60%   Extension 20%  Right lateral flexion 75%  Left lateral flexion 75%  Right rotation 75%  Left rotation 75%    (Blank rows = not tested)   LOWER EXTREMITY MMT:    MMT Right eval Left eval  Hip flexion 4 4  Hip extension 4- 4  Hip abduction    Hip adduction    Hip internal rotation    Hip external rotation    Knee flexion    Knee extension 4+ 4+  Ankle dorsiflexion 4+ 4+  Ankle plantarflexion    Ankle inversion    Ankle eversion     (Blank rows = not tested)    GAIT: Distance walked: 80 Assistive device  utilized: None Level of assistance: Modified independence Comments: slight antalgic gait    TODAY'S TREATMENT  03/26/22: Prone: POE x 2 min  Prone pressup 5x 10" Supine: Transverse abdominis bracing, cueing to pair with exhalation 10x5"  Bridge 10x 5"  LTR 5x 10" Standing: 3D hip excursion 10x each, cueing for squat mechanics Gait training: heel to toe mechanics and arm  swing x 234f  Physical therapy evaluation; HEP instruction   PATIENT EDUCATION:  Education details: Reviewed goals, educated importance of compliance with HEP for maximal benefits, pt able to recall with min cueing for form with current exercise program. Eval: HEP and plan of care Person educated: Patient Education method: Explanation Education comprehension: verbalized understanding, returned demonstration, and needs further education   HOME EXERCISE PROGRAM: Access [facility]: [facility]: [email or web address] Date: 03/10/2022 Prepared by: [facility]  Exercises - Supine Lower Trunk Rotation  - 2 x daily - 7 x weekly - 1 sets - 10 reps - Prone on Elbows Stretch  - 2 x daily - 7 x weekly - 1 sets - 1 reps - 2 min hold - Supine Transversus Abdominis Bracing - Hands on Stomach  - 2 x daily - 7 x weekly - 1 sets - 10 reps  ASSESSMENT:  CLINICAL IMPRESSION: Reviewed goals, educated importance of compliance with HEP for maximal benefits, pt able to recall with min cueing for form with current exercise program.  Session focus on lumbar mobility and proximal strengthening, pt able to demonstrated good mechanics with all exercises with no reports of pain.  Added prone press up, bridge and 3D hip excursion to HEP, printout given and verbalized understanding with all new exercises.  Gait training to improve heel to toe mechanics and trunk rotation with arm swing.   OBJECTIVE IMPAIRMENTS Abnormal gait, decreased activity tolerance, decreased endurance, decreased mobility, difficulty walking, decreased ROM,  decreased strength, decreased safety awareness, hypomobility, impaired perceived functional ability, increased muscle spasms, impaired flexibility, impaired sensation, impaired tone, impaired UE functional use, impaired vision/preception, improper body mechanics, postural dysfunction, and pain.   ACTIVITY LIMITATIONS carrying, lifting, bending, sitting, standing, squatting, sleeping, stairs, transfers, reach over head, and locomotion level  PARTICIPATION LIMITATIONS: cleaning, laundry, community activity, occupation, and yard work  PERSONAL FACTORS Fitness are also affecting patient's functional outcome.   REHAB POTENTIAL: Good  CLINICAL DECISION MAKING: Stable/uncomplicated  EVALUATION COMPLEXITY: Low   GOALS: Goals reviewed with patient? No  SHORT TERM GOALS: Target date: 03/24/2022   patient will be independent with initial HEP   Baseline: Goal status: IN PROGRESS  2.  Patient will demonstrate improved lumbar extension to 50% available to improve ability to reach overhead. Baseline: 20% available Goal status: IN PROGRESS    LONG TERM GOALS: Target date: 04/07/2022  Patient will report at least 50% improvement in overall symptoms and/or function to demonstrate improved functional mobility  Baseline:  Goal status: IN PROGRESS  2.   Patient will improve FOTO score to predicted value to demonstrate improved functional mobility Baseline: 47 Goal status: IN PROGRESS  3.   Patient will be independent with advanced HEP and self management strategies to improve quality of life and functional outcomes.  Baseline:  Goal status: IN PROGRESS  4.  Patient will increase bilateral Lower extremity strength to 5/5 tested motions to improve ability to navigate steps and  to promote return to ambulation community distances with minimal deviation.  Baseline:  MMT Right eval Left eval  Hip flexion 4 4  Hip extension 4- 4  Hip abduction    Hip adduction    Hip internal rotation     Hip external rotation    Knee flexion    Knee extension 4+ 4+  Ankle dorsiflexion 4+ 4+   Goal status: IN PROGRESS  5.  Patient will be able to stand/walk x 30 min to improve ability to walk longer distances for work.  Baseline:  Goal status: IN PROGRESS   PLAN: PT FREQUENCY: 2x/week  PT DURATION: 4 weeks  PLANNED INTERVENTIONS: Therapeutic exercises, Therapeutic activity, Neuromuscular re-education, Balance training, Gait training, Patient/Family education, Joint manipulation, Joint mobilization, Stair training, Vestibular training, Canalith repositioning, Visual/preceptual remediation/compensation, Orthotic/Fit training, Prosthetic training, DME instructions, Aquatic Therapy, Dry Needling, Cognitive remediation, Electrical stimulation, Spinal manipulation, Spinal mobilization, Cryotherapy, Moist heat, Manual lymph drainage, Compression bandaging, scar mobilization, Splintting, Taping, Vasopneumatic device, Traction, Ultrasound, Parrafin, Fluidotherapy, Biofeedback, Ionotophoresis '4mg'$ /ml Dexamethasone, Manual therapy, and Re-evaluation.  PLAN FOR NEXT SESSION: Review HEP compliance; progress lumbar mobility and core stabilization as able; lower extremity strengthening.  Review proper lifting mechanics prior RTW.  Ihor Austin, LPTA/CLT; CBIS [phone]  6:01 PM, 03/26/22

## 2022-03-27 ENCOUNTER — Telehealth: Payer: Self-pay

## 2022-03-27 NOTE — Telephone Encounter (Signed)
Pt doesn't have mychart and has been left multiple messages from both Dr. Lorin Mercy and myself. He has an appt with you on 04/01/22. Can you please make sure he has recevied the message below. Thank you  Per Dr. Lorin Mercy: continue blood thinner for at least 3 more months and will need repeat test in 3 months thanks

## 2022-04-01 ENCOUNTER — Encounter (HOSPITAL_COMMUNITY): Payer: BC Managed Care – PPO

## 2022-04-02 ENCOUNTER — Other Ambulatory Visit: Payer: Self-pay | Admitting: Orthopaedic Surgery

## 2022-04-03 ENCOUNTER — Other Ambulatory Visit: Payer: Self-pay | Admitting: Orthopaedic Surgery

## 2022-04-03 ENCOUNTER — Other Ambulatory Visit: Payer: Self-pay | Admitting: Surgical

## 2022-04-03 ENCOUNTER — Telehealth: Payer: Self-pay | Admitting: Orthopaedic Surgery

## 2022-04-03 MED ORDER — RIVAROXABAN 20 MG PO TABS: 20.0000 mg | ORAL_TABLET | Freq: Every day | ORAL | 0 refills | Status: DC

## 2022-04-03 NOTE — Telephone Encounter (Signed)
Lvm informing pt.

## 2022-04-03 NOTE — Telephone Encounter (Signed)
It wouldn't let me send this in, only printed. Can you please send into pharmacy

## 2022-04-03 NOTE — Telephone Encounter (Signed)
Sent in refill

## 2022-04-03 NOTE — Telephone Encounter (Signed)
Please send Xarelto to the Pharmacy

## 2022-04-03 NOTE — Telephone Encounter (Signed)
I sent a message to Dr. Lorin Mercy but he is out of office. Can you please send in for him?

## 2022-04-07 ENCOUNTER — Encounter (HOSPITAL_COMMUNITY): Payer: BC Managed Care – PPO

## 2022-04-09 ENCOUNTER — Encounter (HOSPITAL_COMMUNITY): Payer: BC Managed Care – PPO

## 2022-04-14 ENCOUNTER — Encounter (HOSPITAL_COMMUNITY): Payer: BC Managed Care – PPO

## 2022-04-16 ENCOUNTER — Encounter (HOSPITAL_COMMUNITY): Payer: BC Managed Care – PPO

## 2022-04-16 DIAGNOSIS — M48062 Spinal stenosis, lumbar region with neurogenic claudication: Secondary | ICD-10-CM | POA: Diagnosis not present

## 2022-04-16 DIAGNOSIS — I82401 Acute embolism and thrombosis of unspecified deep veins of right lower extremity: Secondary | ICD-10-CM | POA: Diagnosis not present

## 2022-05-04 ENCOUNTER — Telehealth: Payer: Self-pay | Admitting: Radiology

## 2022-05-04 NOTE — Telephone Encounter (Signed)
His form is supposed to go to [place] I think, Dr Lorin Mercy has him out, I put a sticky note on it, and will bring ot you this afternoon.

## 2022-05-05 ENCOUNTER — Telehealth: Payer: Self-pay

## 2022-05-05 ENCOUNTER — Encounter: Payer: Self-pay | Admitting: Orthopedic Surgery

## 2022-05-05 ENCOUNTER — Telehealth: Payer: Self-pay | Admitting: Orthopedic Surgery

## 2022-05-05 NOTE — Telephone Encounter (Signed)
Received form for patient to complete. IC s/w patient advised unable to complete form until he follows up with Dr Lorin Mercy. Last seen in June and was to follow up in 1 month.  Made an appt for him to see Dr Lorin Mercy this Thursday in [place] to discuss work status and a 1 month follow up

## 2022-05-05 NOTE — Telephone Encounter (Signed)
Patient called - said someone called regarding his form; states it is mainly for knee pain but has seen Dr Aline Brochure for other medical problems. Asked if we can do the note and form for at least 8 hours at a time, due to new work schedule. I relayed form fee/authorization form process, and note has been done as requested, from start date 04/16/22.

## 2022-05-06 NOTE — Telephone Encounter (Signed)
Patient came to sign authorization form for the intermittent FMLA which he does request to be done by Dr Aline Brochure for knees; patient was seen by Dr Aline Brochure for knees 02/11/22.  Please advise patient if okay to proceed with form.

## 2022-05-06 NOTE — Telephone Encounter (Signed)
I discussed with Brandon Burton- patient will see Dr Lorin Mercy in [place] tomorrow and we will go from there.

## 2022-05-07 ENCOUNTER — Ambulatory Visit: Payer: BC Managed Care – PPO | Admitting: Orthopaedic Surgery

## 2022-05-08 NOTE — Telephone Encounter (Signed)
Patient responded that the FMLA intermittent form is due today, 05/11/22; please advise.

## 2022-05-08 NOTE — Telephone Encounter (Signed)
Need more information: is this for intermittent leave ? Or 12 week leave

## 2022-05-08 NOTE — Telephone Encounter (Signed)
Called patient to relay

## 2022-05-08 NOTE — Telephone Encounter (Signed)
[name] , approved

## 2022-05-11 NOTE — Telephone Encounter (Signed)
Please let patient (or patient's wife/designated contact. Otila Kluver, [phone], when intermittent FMLA form has been sent. Aware letter is done.

## 2022-05-11 NOTE — Telephone Encounter (Signed)
noted 

## 2022-05-11 NOTE — Telephone Encounter (Signed)
I called LMVM for Brandon Burton advising patient's form has been faxed in.

## 2022-05-14 ENCOUNTER — Encounter: Payer: Self-pay | Admitting: Orthopaedic Surgery

## 2022-05-14 ENCOUNTER — Ambulatory Visit (INDEPENDENT_AMBULATORY_CARE_PROVIDER_SITE_OTHER): Payer: BC Managed Care – PPO | Admitting: Orthopaedic Surgery

## 2022-05-14 VITALS — Ht 68.0 in | Wt 243.0 lb

## 2022-05-14 DIAGNOSIS — I82401 Acute embolism and thrombosis of unspecified deep veins of right lower extremity: Secondary | ICD-10-CM

## 2022-05-14 DIAGNOSIS — Z9889 Other specified postprocedural states: Secondary | ICD-10-CM

## 2022-05-14 NOTE — Progress Notes (Signed)
Office Visit Note   Patient: Brandon Burton           Date of Birth: [birth year]           MRN: [id number] Visit Date: 05/14/2022              Requested by: Asencion Noble, MD [address] [place],  [place] [address] PCP: Asencion Noble, MD   Assessment & Plan: Visit Diagnoses:  1. Deep vein thrombosis (DVT) of right lower extremity, unspecified chronicity, unspecified vein (HCC)   2. Status post lumbar spine surgery for decompression of spinal cord     Plan: Recheck 10 weeks.  Venous Doppler set up in 2 weeks I will call him with the results.  If DVT is resolved we can consider stopping the Xarelto and then repeating a Doppler if he gets recurrent symptoms.  If there is persistent clot we may need to consider hematologic referral to evaluate him for possible hypercoagulable condition.  Patient is happy that has been able to get back to work.  Follow-Up Instructions: Return in about 10 weeks (around 07/23/2022).   Orders:  Orders Placed This Encounter  Procedures   US Venous Img Lower Unilateral Right (DVT)   No orders of the defined types were placed in this encounter.     Procedures: No procedures performed   Clinical Data: No additional findings.   Subjective: Chief Complaint  Patient presents with   Lower Back - Follow-up    11/26/2021 L3-4, L4-5 dentral decompression, L3-4 microdiscectomy 12/10/2021 lumbar re-exploration    HPI 56 year old male returns post two-level decompression L3-4 L4-5 on 11/26/2021 and reexploration of dural repair 12/10/2021.  Patient is back at work he states he has some days when he has soreness in his legs.  He has used ibuprofen.  He requested a note for yesterday and today since he had a flare.  Patient had DVT after the surgery and last Doppler showed some femoral vein DVT and he remains on Xarelto and needs an update venous Doppler in about 2 weeks to assess the previous right femoral vein DVT.  Minimal swelling in his legs.  Review of  Systems all the systems updated unchanged   Objective: Vital Signs: Ht '5\' 8"'$  (1.727 m)   Wt 243 lb (110.2 kg)   BMI 36.95 kg/m   Physical Exam Constitutional:      Appearance: He is well-developed.  HENT:     Head: Normocephalic and atraumatic.     Right Ear: External ear normal.     Left Ear: External ear normal.  Eyes:     Pupils: Pupils are equal, round, and reactive to light.  Neck:     Thyroid: No thyromegaly.     Trachea: No tracheal deviation.  Cardiovascular:     Rate and Rhythm: Normal rate.  Pulmonary:     Effort: Pulmonary effort is normal.     Breath sounds: No wheezing.  Abdominal:     General: Bowel sounds are normal.     Palpations: Abdomen is soft.  Musculoskeletal:     Cervical back: Neck supple.  Skin:    General: Skin is warm and dry.     Capillary Refill: Capillary refill takes less than 2 seconds.  Neurological:     Mental Status: He is alert and oriented to person, place, and time.  Psychiatric:        Behavior: Behavior normal.        Thought Content: Thought content normal.  Judgment: Judgment normal.     Ortho Exam anterior tib gastrocsoleus is healed.  Lumbar incisions well-healed no drainage no tenderness.  Negative straight leg raising 90 degrees negative logroll hips.  Distal pulses are normal.  No lower extremity edema.  Specialty Comments:  No specialty comments available.  Imaging: No results found.   PMFS History: Patient Active Problem List   Diagnosis Date Noted   Surgical site infection 12/23/2021   Medication monitoring encounter 12/23/2021   DVT (deep venous thrombosis) ([place]) 12/17/2021   Wound drainage 12/10/2021   Status post lumbar spine surgery for decompression of spinal cord 12/09/2021   Postoperative CSF leak 12/09/2021   Lumbar stenosis 11/26/2021   Spinal stenosis of lumbar region 07/04/2021   History of colonic polyps 08/16/2018   GERD (gastroesophageal reflux disease) 07/08/2015   Rectal bleeding  07/08/2015   ANKLE SPRAIN 09/10/2009   CLOSED FRACTURE OF CALCANEUS 08/27/2009   SHOULDER STRAIN 02/27/2009   Past Medical History:  Diagnosis Date   GERD (gastroesophageal reflux disease)    Hypertension     Family History  Problem Relation Age of Onset   Thyroid cancer Mother    Heart attack Father    Colon cancer Neg Hx    Colon polyps Neg Hx     Past Surgical History:  Procedure Laterality Date   COLONOSCOPY N/A 07/22/2015   SLF:7 polyps removed/mild diverticulosis/moderate internal hemorrhoids   COLONOSCOPY N/A 11/18/2018   Procedure: COLONOSCOPY;  Surgeon: Danie Binder, MD;  [facility];  Service: Endoscopy;  Laterality: N/A;  2:15pm   EYE SURGERY     LUMBAR LAMINECTOMY N/A 12/10/2021   Procedure: LUMBAR RE-EXPLORATION;  Surgeon: [name], MD;  Location: [place];  Service: Orthopedics;  Laterality: N/A;   LUMBAR LAMINECTOMY/DECOMPRESSION MICRODISCECTOMY N/A 11/26/2021   Procedure: Lumbar three-four, Lumbar four-five Central Decompression, Lumbar Three-Four Microdiscectomhy;  Surgeon: [name], MD;  Location: [place];  Service: Orthopedics;  Laterality: N/A;   SHOULDER SURGERY Left 09/28/2013   fix previous clavicular fracture complication   Social History   Occupational History   Not on file  Tobacco Use   Smoking status: Light Smoker    Types: Cigars   Smokeless tobacco: Never   Tobacco comments:    weekend / social  Vaping Use   Vaping Use: Never used  Substance and Sexual Activity   Alcohol use: Not Currently    Comment: social, every 1-2 months approx 1 drink per occasion   Drug use: Yes    Types: Marijuana    Comment: sometimes for pain   Sexual activity: Not on file

## 2022-06-10 ENCOUNTER — Other Ambulatory Visit: Payer: Self-pay | Admitting: Surgical

## 2022-06-10 NOTE — Telephone Encounter (Signed)
[name] patient

## 2022-06-10 NOTE — Telephone Encounter (Signed)
Continue, pending repeat doppler 10/23

## 2022-06-18 ENCOUNTER — Telehealth: Payer: Self-pay | Admitting: Radiology

## 2022-06-18 NOTE — Telephone Encounter (Signed)
Patient requests note excusing him from work 06/15/2022-06/18/2022 due to back spasms and knee pain. He has been unable to take his arthritis medications.  Please advise. OK for note?

## 2022-06-18 NOTE — Telephone Encounter (Signed)
Note entered and printed. Patient advised.

## 2022-06-22 ENCOUNTER — Other Ambulatory Visit: Payer: Self-pay | Admitting: Orthopedic Surgery

## 2022-06-22 DIAGNOSIS — M5127 Other intervertebral disc displacement, lumbosacral region: Secondary | ICD-10-CM

## 2022-07-16 ENCOUNTER — Other Ambulatory Visit: Payer: Self-pay | Admitting: Orthopaedic Surgery

## 2022-07-20 ENCOUNTER — Ambulatory Visit (HOSPITAL_COMMUNITY)
Admission: RE | Admit: 2022-07-20 | Discharge: 2022-07-20 | Disposition: A | Discharge status: Home / Self Care | Payer: BC Managed Care – PPO | Source: Ambulatory Visit | Attending: Orthopaedic Surgery | Admitting: Orthopaedic Surgery

## 2022-07-20 DIAGNOSIS — I824Z1 Acute embolism and thrombosis of unspecified deep veins of right distal lower extremity: Secondary | ICD-10-CM | POA: Diagnosis not present

## 2022-07-20 DIAGNOSIS — I82401 Acute embolism and thrombosis of unspecified deep veins of right lower extremity: Secondary | ICD-10-CM | POA: Insufficient documentation

## 2022-07-21 NOTE — Telephone Encounter (Signed)
I called patient and left voicemail advising, per Dr. Lorin Mercy, continue Xarelto x one month and then transition to ASA '81mg'$  daily.

## 2022-08-03 ENCOUNTER — Telehealth: Payer: Self-pay

## 2022-08-03 NOTE — Telephone Encounter (Signed)
Refill request received from Brandon Burton for Omeprazole 40 mg Capsules, once daily. Pt was last seen on 07/05/2020.

## 2022-08-05 ENCOUNTER — Telehealth: Payer: Self-pay | Admitting: Internal Medicine

## 2022-08-05 ENCOUNTER — Other Ambulatory Visit: Payer: Self-pay | Admitting: Internal Medicine

## 2022-08-05 MED ORDER — OMEPRAZOLE 40 MG PO CPDR: 40.0000 mg | DELAYED_RELEASE_CAPSULE | Freq: Every day | ORAL | 0 refills | Status: DC

## 2022-08-05 NOTE — Telephone Encounter (Signed)
Left patient a message to schedule an OV for refills.  I was going to offer a virtual appt if he was interested.  Asked him to call back to schedule.

## 2022-08-05 NOTE — Telephone Encounter (Signed)
Refilled x90 days, needs OV

## 2022-08-06 ENCOUNTER — Ambulatory Visit: Payer: BC Managed Care – PPO | Admitting: Orthopaedic Surgery

## 2022-08-27 ENCOUNTER — Telehealth: Payer: Self-pay | Admitting: Radiology

## 2022-08-27 DIAGNOSIS — I82401 Acute embolism and thrombosis of unspecified deep veins of right lower extremity: Secondary | ICD-10-CM

## 2022-08-27 NOTE — Addendum Note (Signed)
Addended by: Meyer Cory on: 08/27/2022 05:05 PM   Modules accepted: Orders

## 2022-08-27 NOTE — Telephone Encounter (Signed)
Patient's wife called stating patient will be out of Xarelto on Monday. She thinks that patient was told to start aspirin once he completed the Xarelto and wondered if we were going to repeat doppler to be sure that clot was gone.   Please advise on change from Xarelto to aspirin.  Patient's wife asked if doppler can be done prior to follow up appointment if we need to repeat it.

## 2022-08-27 NOTE — Telephone Encounter (Signed)
I advised patient and wife, per Dr. Lorin Mercy ASA '325mg'$  qd once Xarelto has run out. Also advised, new doppler will be ordered so that he can have that done prior to return office visit.

## 2022-08-28 ENCOUNTER — Telehealth: Payer: Self-pay | Admitting: Radiology

## 2022-08-28 NOTE — Telephone Encounter (Signed)
Wife called, needs to schedule an appt for patient close to time FMLA needs to be renewed.  FMLA is good through 1/20/*2024.  Please call her and schedule him an appt right before this.  Thanks.[phone]

## 2022-09-06 ENCOUNTER — Other Ambulatory Visit: Payer: Self-pay | Admitting: Surgery

## 2022-09-10 ENCOUNTER — Ambulatory Visit: Payer: BC Managed Care – PPO | Admitting: Orthopaedic Surgery

## 2022-09-17 ENCOUNTER — Encounter (HOSPITAL_COMMUNITY): Payer: Self-pay

## 2022-09-17 NOTE — Therapy (Signed)
PHYSICAL THERAPY DISCHARGE SUMMARY  Visits from Start of Care: 2  Current functional level related to goals / functional outcomes: NA   Remaining deficits: NA   Education / Equipment: NA   Patient agrees to discharge. Patient goals were not met. Patient is being discharged due to not returning since the last visit.  

## 2022-09-22 ENCOUNTER — Ambulatory Visit (HOSPITAL_COMMUNITY): Admission: RE | Admit: 2022-09-22 | Payer: BC Managed Care – PPO | Source: Ambulatory Visit

## 2022-09-24 ENCOUNTER — Encounter (HOSPITAL_COMMUNITY): Payer: Self-pay

## 2022-09-24 ENCOUNTER — Ambulatory Visit: Payer: BC Managed Care – PPO | Admitting: Gastroenterology

## 2022-09-24 ENCOUNTER — Ambulatory Visit (HOSPITAL_COMMUNITY): Admission: RE | Admit: 2022-09-24 | Payer: BC Managed Care – PPO | Source: Ambulatory Visit

## 2022-10-06 ENCOUNTER — Ambulatory Visit (HOSPITAL_COMMUNITY): Admission: RE | Admit: 2022-10-06 | Payer: BC Managed Care – PPO | Source: Ambulatory Visit

## 2022-10-07 ENCOUNTER — Telehealth: Payer: Self-pay

## 2022-10-07 ENCOUNTER — Telehealth: Payer: Self-pay | Admitting: Radiology

## 2022-10-07 ENCOUNTER — Ambulatory Visit (HOSPITAL_COMMUNITY)
Admission: RE | Admit: 2022-10-07 | Discharge: 2022-10-07 | Disposition: A | Discharge status: Home / Self Care | Payer: BC Managed Care – PPO | Source: Ambulatory Visit | Attending: Orthopaedic Surgery | Admitting: Orthopaedic Surgery

## 2022-10-07 ENCOUNTER — Other Ambulatory Visit: Payer: Self-pay

## 2022-10-07 ENCOUNTER — Other Ambulatory Visit: Payer: Self-pay | Admitting: Radiology

## 2022-10-07 DIAGNOSIS — I82401 Acute embolism and thrombosis of unspecified deep veins of right lower extremity: Secondary | ICD-10-CM | POA: Insufficient documentation

## 2022-10-07 MED ORDER — RIVAROXABAN 20 MG PO TABS: ORAL_TABLET | ORAL | 0 refills | Status: DC

## 2022-10-07 NOTE — Telephone Encounter (Signed)
Please see call report for repeat doppler and advise.  IMPRESSION: 1. Acute below-the-knee DVT involving the gastrocnemius vein. 2. Chronic nonocclusive DVT in the distal superficial femoral vein peripherally, potentially with some exaggerated collateral venous flow in this region.

## 2022-10-07 NOTE — Telephone Encounter (Signed)
Talked with patient and advised him of Dr. Lorin Mercy message and that Rx for Xarelto was sent to his pharmacy.  Patient voiced that he understands.

## 2022-10-07 NOTE — Telephone Encounter (Signed)
-----   Message from Shelle Iron, [place] sent at 10/07/2022  4:04 PM EST ----- Yes ma'am. done ----- Message ----- From: Mal Misty, RMA Sent: 10/07/2022   3:46 PM EST To: Shelle Iron, RMA  Can you send this Rx in for [facility] please? She had asked me to check for a reply from Dr. Lorin Mercy.  Didn't won't to do Rx incorrectly.     Thank you ----- Message ----- From: [name], MD Sent: 10/07/2022   2:34 PM EST To: Carmine Savoy, RT  Send in new Rx back on Xarelto . Thanks   hematology may modify when they see him. thanks ----- Message ----- From: Carmine Savoy, RT Sent: 10/07/2022   1:06 PM EST To: [name], MD  Referral entered, patient advised. We had patient complete Xarelto and transition to one aspirin daily after last visit. Does he need to restart Xarelto? He will need new prescription. Thanks. ----- Message ----- From: [name], MD Sent: 10/07/2022  12:03 PM EST To: Carmine Savoy, RT  Hematology referral to make sure he does not have a hypercoagulable state since he has had persistent DVT present still on Xarelto thanks please call patient and let him know thanks ----- Message ----- From: Interface, Rad Results In Sent: 10/07/2022  11:24 AM EST To: [name], MD

## 2022-10-15 ENCOUNTER — Encounter: Payer: Self-pay | Admitting: Orthopedic Surgery

## 2022-10-15 ENCOUNTER — Ambulatory Visit: Payer: BC Managed Care – PPO | Admitting: Internal Medicine

## 2022-10-15 ENCOUNTER — Ambulatory Visit: Payer: BC Managed Care – PPO | Admitting: Orthopedic Surgery

## 2022-10-15 VITALS — BP 129/74 | HR 78 | Ht 68.0 in | Wt 249.0 lb

## 2022-10-15 DIAGNOSIS — M25561 Pain in right knee: Secondary | ICD-10-CM | POA: Diagnosis not present

## 2022-10-15 DIAGNOSIS — G8929 Other chronic pain: Secondary | ICD-10-CM | POA: Diagnosis not present

## 2022-10-15 DIAGNOSIS — M1712 Unilateral primary osteoarthritis, left knee: Secondary | ICD-10-CM

## 2022-10-15 NOTE — Progress Notes (Signed)
Chief Complaint  Patient presents with   Knee Pain    History of L knee pain and states the R is starting to bother him as well     Encounter Diagnoses  Name Primary?   Chronic pain of left knee Yes   Chronic pain of right knee    Primary osteoarthritis of left knee     Chun is having bilateral knee pain but he is now on a blood thinner cannot take his anti-inflammatories due to bilateral knee pain right and left.  The Tylenol does not really get rid him of the pain  At this time he is having arthritic type symptoms.  His knee range of motion seems good he walks without support there is no limp  He will have his FMLA papers continued with no changes  Follow-up.

## 2022-10-28 NOTE — Progress Notes (Incomplete Revision)
[facility] [address] [place] [address]   CLINIC:  Medical Oncology/Hematology  CONSULT NOTE  Patient Care Team: Asencion Noble, MD as PCP - General (Internal Medicine) Danie Binder, MD (Inactive) as Consulting Physician (Gastroenterology)  CHIEF COMPLAINTS/PURPOSE OF CONSULTATION:  Right leg DVT   HISTORY OF PRESENTING ILLNESS:   Brandon Burton 57 y.o. male is here at the request of Dr. Rodell Perna (orthopedic surgeon) for evaluation of hypercoagulable state in setting of persistent right leg DVT.  Patient underwent surgery for lumbar decompression and microdiscectomy performed by Dr. Lorin Mercy on 11/26/2021.  This was complicated by postoperative CSF leak requiring 10-day hospitalization in March 2023.  During hospitalization, he was on strict bedrest due to CSF leak and was unable to be placed on chemical DVT prophylaxis due to copious bloody drainage from his wound.  Patient had provoked DVT secondary to this event, with bilateral lower extremity vascular US on 12/16/2021 showing acute DVT of right popliteal vein, right femoral vein, and right posterior tibial veins.  Patient has been taking Xarelto since 12/17/2021.  He has had several interval venous ultrasound which showed failure of initial DVT to resolve as well as new acute on chronic blood clots (further detailed below).  Patient reports that he has been compliant with Xarelto and has been taking it "religiously."  He was temporarily off of Xarelto at the instruction of Dr. Lorin Mercy for about 4 to 6 weeks around November/December 2023, but was placed back on Xarelto when it was found that he had persistence of his prior DVT.  He has been tolerating Xarelto well without any major bleeding events.  He denies any unilateral leg swelling, pain, or erythema.  No shortness of breath, dyspnea on exertion, chest pain, cough, hemoptysis, or palpitations.  Patient has no prior history of DVT or PE.  He denies any family history  of coagulopathy or miscarriages.  Denies any prior history of malignancy and is up-to-date on his age-appropriate cancer screenings with colonoscopy in 2020 negative for any malignant or premalignant findings.  He is a non-smoker.  Additional risk factors for DVT include obesity and hypertension.  His PMH is significant for hypertension, degenerative disc disease, arthritis, and GERD.  He works at a [employer].  He is married and lives at home with his wife.  He denies any tobacco, alcohol, illicit drug use.  Family history negative for any clotting disorders.  Patient's mother had thyroid cancer.   MEDICAL HISTORY:  Past Medical History:  Diagnosis Date   GERD (gastroesophageal reflux disease)    Hypertension     SURGICAL HISTORY: Past Surgical History:  Procedure Laterality Date   COLONOSCOPY N/A 07/22/2015   SLF:7 polyps removed/mild diverticulosis/moderate internal hemorrhoids   COLONOSCOPY N/A 11/18/2018   Procedure: COLONOSCOPY;  Surgeon: Danie Binder, MD;  Location: [facility];  Service: Endoscopy;  Laterality: N/A;  2:15pm   EYE SURGERY     LUMBAR LAMINECTOMY N/A 12/10/2021   Procedure: LUMBAR RE-EXPLORATION;  Surgeon: [name], MD;  Location: [facility];  Service: Orthopedics;  Laterality: N/A;   LUMBAR LAMINECTOMY/DECOMPRESSION MICRODISCECTOMY N/A 11/26/2021   Procedure: Lumbar three-four, Lumbar four-five Central Decompression, Lumbar Three-Four Microdiscectomhy;  Surgeon: [name], MD;  Location: [facility];  Service: Orthopedics;  Laterality: N/A;   SHOULDER SURGERY Left 09/28/2013   fix previous clavicular fracture complication    SOCIAL HISTORY: Social History   Socioeconomic History   Marital status: Married  Spouse name: Not on file   Number of children: Not on file   Years of education: Not on file   Highest education level: Not on file  Occupational History   Not on file  Tobacco Use   Smoking status: Light Smoker    Types:  Cigars   Smokeless tobacco: Never   Tobacco comments:    weekend / social  Vaping Use   Vaping Use: Never used  Substance and Sexual Activity   Alcohol use: Not Currently    Comment: social, every 1-2 months approx 1 drink per occasion   Drug use: Yes    Types: Marijuana    Comment: sometimes for pain   Sexual activity: Not on file  Other Topics Concern   Not on file  Social History Narrative   Not on file   Social Determinants of Health   Financial Resource Strain: Not on file  Food Insecurity: Not on file  Transportation Needs: Not on file  Physical Activity: Not on file  Stress: Not on file  Social Connections: Not on file  Intimate Partner Violence: Not on file    FAMILY HISTORY: Family History  Problem Relation Age of Onset   Thyroid cancer Mother    Heart attack Father    Colon cancer Neg Hx    Colon polyps Neg Hx     ALLERGIES:  has No Known Allergies.  MEDICATIONS:  Current Outpatient Medications  Medication Sig Dispense Refill   amLODipine (NORVASC) 10 MG tablet Take 10 mg by mouth daily.     linezolid (ZYVOX) 600 MG tablet Take 1 tablet (600 mg total) by mouth 2 (two) times daily. (Patient not taking: Reported on 10/15/2022) 45 tablet 0   methocarbamol (ROBAXIN) 500 MG tablet TAKE 1 TABLET(500 MG) BY MOUTH THREE TIMES DAILY (Patient not taking: Reported on 10/15/2022) 60 tablet 1   omeprazole (PRILOSEC) 40 MG capsule Take 1 capsule (40 mg total) by mouth daily. 90 capsule 0   oxyCODONE-acetaminophen (PERCOCET) 5-325 MG tablet Take 1 tablet by mouth every 4 (four) hours as needed for severe pain. (Patient not taking: Reported on 02/12/2022) 45 tablet 0   rivaroxaban (XARELTO) 20 MG TABS tablet TAKE 1 TABLET(20 MG) BY MOUTH DAILY 30 tablet 0   traMADol (ULTRAM) 50 MG tablet Take 1 tablet (50 mg total) by mouth every 12 (twelve) hours as needed. (Patient not taking: Reported on 05/14/2022) 30 tablet 0   No current facility-administered medications for this visit.     REVIEW OF SYSTEMS:    Review of Systems  Constitutional:  Positive for fatigue (50%). Negative for appetite change, chills, diaphoresis, fever and unexpected weight change.  HENT:   Negative for lump/mass and nosebleeds.   Eyes:  Negative for eye problems.  Respiratory:  Negative for cough, hemoptysis and shortness of breath.   Cardiovascular:  Negative for chest pain, leg swelling and palpitations.  Gastrointestinal:  Negative for abdominal pain, blood in stool, constipation, diarrhea, nausea and vomiting.  Genitourinary:  Positive for difficulty urinating (weak stream). Negative for hematuria.   Musculoskeletal:  Positive for arthralgias.  Skin: Negative.   Neurological:  Positive for numbness. Negative for dizziness, headaches and light-headedness.  Hematological:  Does not bruise/bleed easily.      PHYSICAL EXAMINATION:   ECOG PERFORMANCE STATUS: 1 - Symptomatic but completely ambulatory  There were no vitals filed for this visit. There were no vitals filed for this visit.  Physical Exam Constitutional:      Appearance: Normal appearance.  He is obese.  HENT:     Head: Normocephalic and atraumatic.     Mouth/Throat:     Mouth: Mucous membranes are moist.  Eyes:     Extraocular Movements: Extraocular movements intact.     Pupils: Pupils are equal, round, and reactive to light.  Cardiovascular:     Rate and Rhythm: Normal rate and regular rhythm.     Pulses: Normal pulses.     Heart sounds: Normal heart sounds.  Pulmonary:     Effort: Pulmonary effort is normal.     Breath sounds: Normal breath sounds.  Abdominal:     General: Bowel sounds are normal.     Palpations: Abdomen is soft.     Tenderness: There is no abdominal tenderness.  Musculoskeletal:        General: No swelling.     Right lower leg: No edema.     Left lower leg: No edema.  Lymphadenopathy:     Cervical: No cervical adenopathy.  Skin:    General: Skin is warm and dry.  Neurological:      General: No focal deficit present.     Mental Status: He is alert and oriented to person, place, and time.  Psychiatric:        Mood and Affect: Mood normal.        Behavior: Behavior normal.       LABORATORY DATA:  I have reviewed the data as listed No results found for this or any previous visit (from the past 2160 hour(s)).  RADIOGRAPHIC STUDIES: I have personally reviewed the radiological images as listed and agreed with the findings in the report. US Venous Img Lower Unilateral Right (DVT)  Result Date: 10/07/2022 CLINICAL DATA:  Previous DVT in the right lower extremity EXAM: RIGHT LOWER EXTREMITY VENOUS DOPPLER ULTRASOUND TECHNIQUE: Gray-scale sonography with compression, as well as color and duplex ultrasound, were performed to evaluate the deep venous system(s) from the level of the common femoral vein through the popliteal and proximal calf veins. COMPARISON:  None Available. FINDINGS: VENOUS Below the knee DVT of the right gastrocnemius vein is present. In addition there is some suspected peripheral chronic DVT in the distal right femoral vein potentially with exaggerated collateral venous flow extending around the margin of the right distal superficial femoral artery. However, no acute femoral vein DVT is observed. Likewise there no findings of acute DVT involving the common femoral vein or popliteal vein, or the calf veins with the exception of the gastrocnemius. Limited views of the contralateral common femoral vein are unremarkable. OTHER None. Limitations: none IMPRESSION: 1. Acute below-the-knee DVT involving the gastrocnemius vein. 2. Chronic nonocclusive DVT in the distal superficial femoral vein peripherally, potentially with some exaggerated collateral venous flow in this region. Electronically Signed   By: Van Clines M.D.   On: 10/07/2022 11:22     ASSESSMENT & PLAN:  1.  Provoked LLE DVT, incomplete resolution despite anticoagulation - Seen at the request of Dr.  Rodell Perna (orthopedic surgeon) for evaluation of hypercoagulable state in setting of persistent right leg DVT. - Provoked DVT in March 2023 following back surgery: Lumbar decompression and microdiscectomy performed by Dr. Lorin Mercy on 11/26/2021.  This was complicated by postoperative CSF leak requiring 10-day hospitalization in March 2023. Several days of strict bedrest due to CSF leak, unable to be placed on chemical DVT prophylaxis due to ongoing bleeding from wound. Vascular US on 12/16/2021 showed acute DVT of right popliteal vein, right femoral vein, and right posterior  tibial veins.   - Patient has been compliant with Xarelto since it was started on 12/17/2021. - He was temporarily off of Xarelto at the instruction of Dr. Lorin Mercy for about 4 to 6 weeks around November/December 2023 and was on aspirin 81 mg daily at that time instead.  Xarelto restarted as of 10/07/2022. - Follow-up venous ultrasounds have showed failure of initial DVT to resolve, as well as new acute on chronic blood clots: (03/23/2022): New occlusive thrombus in right femoral vein in the proximal mid thigh; previously identified thrombus within distal thigh femoral vein and calf veins remains present but is now nonocclusive (07/20/2022): No convincing evidence of acute DVT.  Interval resolution of DVT from within the femoral artery.  Chronic/subacute thrombus persists in deep gastrocnemius veins of the calf. (10/07/2022): Acute below the knee DVT involving gastrocnemius vein with chronic nonocclusive DVT and distal superficial femoral vein - No prior history of DVT or PE.  No family history of clotting disorders or miscarriages. - PLAN: Will check a hypercoagulable workup today to include PT gene mutation, factor V Leiden, lupus anticoagulant panel, anticardiolipin antibodies, beta-2 glycoprotein antibodies, D-dimer, CBC/D, CMP - Due to evident failure of Xarelto, we will transition patient to warfarin.  Extensive warfarin education  provided to patient during visit today.  Will start at 5 mg daily and follow closely with serial INR during the initiation phase. - Due to evidence of breakthrough clots while on anticoagulation, patient will likely require lifelong anticoagulation -- Repeat venous US in 3 months   2.  Other history - PMH: Hypertension, degenerative disc disease, arthritis, GERD - SOCIAL: He works at a [employer]. He is married and lives at home with his wife. He denies any tobacco, alcohol, illicit drug use.  - FAMILY: Family history negative for any clotting disorders. Patient's mother had thyroid cancer.    PLAN SUMMARY: >> Labs TODAY (beta-2 glycoprotein, cardiolipin, CBC/differential, CMP, D-dimer, factor V Leiden, lupus anticoagulant panel, PT gene mutation) >> Lab appointment [facility], starting on Monday afternoon 11/02/2022 for INR CHECK >> Referral to nutritionist for warfarin dietary education >> Same-day labs (CBC/D, CMP, INR, D-dimer) + office visit in 1 month    All questions were answered. The patient knows to call the clinic with any problems, questions or concerns.  Medical decision making: Moderate  Time spent on visit: I spent 75 minutes counseling the patient face to face. The total time spent in the appointment was 100 minutes and more than 50% was on counseling.  I, Tarri Abernethy PA-C, have seen this patient in conjunction with Dr. Derek Jack.  Greater than 50% of visit was performed by Dr. Delton Coombes.   Harriett Rush, PA-C 10/31/2022 11:30 PM  DR. KATRAGADDA: I have independently evaluated this patient and formulated my assessment and plan.  I agree with HPI, assessment and plan written by Casey Burkitt, PA-C.  This patient is evaluated for recurrent right lower extremity DVT.  The recurrences happened while he is on Xarelto.  Last Doppler on 10/07/2022 showed acute below the knee DVT involving gastrocnemius vein and chronic  nonocclusive DVT in the distal superficial femoral vein peripherally.  Doppler on 07/20/2022 showed resolution of DVT in the femoral artery with no acute DVT.  Chronic/subacute thrombus persists in the deep gastrocnemius veins in the calf.  He is otherwise active and is working full-time.  I have recommended that he discontinue Xarelto and start on Coumadin.  Will likely repeat Doppler in 3  months.  Will check for antiphospholipid syndrome, factor V Leiden and prothrombin gene mutation.

## 2022-10-28 NOTE — Progress Notes (Addendum)
[facility] [address] [place] [address]   CLINIC:  Medical Oncology/Hematology  CONSULT NOTE  Patient Care Team: Asencion Noble, MD as PCP - General (Internal Medicine) Danie Binder, MD (Inactive) as Consulting Physician (Gastroenterology)  CHIEF COMPLAINTS/PURPOSE OF CONSULTATION:  Right leg DVT   HISTORY OF PRESENTING ILLNESS:   Brandon Burton 57 y.o. male is here at the request of Dr. Rodell Perna (orthopedic surgeon) for evaluation of hypercoagulable state in setting of persistent right leg DVT.  Patient underwent surgery for lumbar decompression and microdiscectomy performed by Dr. Lorin Mercy on 11/26/2021.  This was complicated by postoperative CSF leak requiring 10-day hospitalization in March 2023.  During hospitalization, he was on strict bedrest due to CSF leak and was unable to be placed on chemical DVT prophylaxis due to copious bloody drainage from his wound.  Patient had provoked DVT secondary to this event, with bilateral lower extremity vascular US on 12/16/2021 showing acute DVT of right popliteal vein, right femoral vein, and right posterior tibial veins.  Patient has been taking Xarelto since 12/17/2021.  He has had several interval venous ultrasound which showed failure of initial DVT to resolve as well as new acute on chronic blood clots (further detailed below).  Patient reports that he has been compliant with Xarelto and has been taking it "religiously."  He was temporarily off of Xarelto at the instruction of Dr. Lorin Mercy for about 4 to 6 weeks around November/December 2023, but was placed back on Xarelto when it was found that he had persistence of his prior DVT.  He has been tolerating Xarelto well without any major bleeding events.  He denies any unilateral leg swelling, pain, or erythema.  No shortness of breath, dyspnea on exertion, chest pain, cough, hemoptysis, or palpitations.  Patient has no prior history of DVT or PE.  He denies any family history  of coagulopathy or miscarriages.  Denies any prior history of malignancy and is up-to-date on his age-appropriate cancer screenings with colonoscopy in 2020 negative for any malignant or premalignant findings.  He is a non-smoker.  Additional risk factors for DVT include obesity and hypertension.  His PMH is significant for hypertension, degenerative disc disease, arthritis, and GERD.  He works at a [employer].  He is married and lives at home with his wife.  He denies any tobacco, alcohol, illicit drug use.  Family history negative for any clotting disorders.  Patient's mother had thyroid cancer.   MEDICAL HISTORY:  Past Medical History:  Diagnosis Date   GERD (gastroesophageal reflux disease)    Hypertension     SURGICAL HISTORY: Past Surgical History:  Procedure Laterality Date   COLONOSCOPY N/A 07/22/2015   SLF:7 polyps removed/mild diverticulosis/moderate internal hemorrhoids   COLONOSCOPY N/A 11/18/2018   Procedure: COLONOSCOPY;  Surgeon: Danie Binder, MD;  [facility]: [facility];  Service: Endoscopy;  Laterality: N/A;  2:15pm   EYE SURGERY     LUMBAR LAMINECTOMY N/A 12/10/2021   Procedure: LUMBAR RE-EXPLORATION;  Surgeon: [name], MD;  Location: [facility];  Service: Orthopedics;  Laterality: N/A;   LUMBAR LAMINECTOMY/DECOMPRESSION MICRODISCECTOMY N/A 11/26/2021   Procedure: Lumbar three-four, Lumbar four-five Central Decompression, Lumbar Three-Four Microdiscectomhy;  Surgeon: [name], MD;  Location: [facility];  Service: Orthopedics;  Laterality: N/A;   SHOULDER SURGERY Left 09/28/2013   fix previous clavicular fracture complication    SOCIAL HISTORY: Social History   Socioeconomic History   Marital status: Married  Spouse name: Not on file   Number of children: Not on file   Years of education: Not on file   Highest education level: Not on file  Occupational History   Not on file  Tobacco Use   Smoking status: Light Smoker    Types:  Cigars   Smokeless tobacco: Never   Tobacco comments:    weekend / social  Vaping Use   Vaping Use: Never used  Substance and Sexual Activity   Alcohol use: Not Currently    Comment: social, every 1-2 months approx 1 drink per occasion   Drug use: Yes    Types: Marijuana    Comment: sometimes for pain   Sexual activity: Not on file  Other Topics Concern   Not on file  Social History Narrative   Not on file   Social Determinants of Health   Financial Resource Strain: Not on file  Food Insecurity: Not on file  Transportation Needs: Not on file  Physical Activity: Not on file  Stress: Not on file  Social Connections: Not on file  Intimate Partner Violence: Not on file    FAMILY HISTORY: Family History  Problem Relation Age of Onset   Thyroid cancer Mother    Heart attack Father    Colon cancer Neg Hx    Colon polyps Neg Hx     ALLERGIES:  has No Known Allergies.  MEDICATIONS:  Current Outpatient Medications  Medication Sig Dispense Refill   amLODipine (NORVASC) 10 MG tablet Take 10 mg by mouth daily.     linezolid (ZYVOX) 600 MG tablet Take 1 tablet (600 mg total) by mouth 2 (two) times daily. (Patient not taking: Reported on 10/15/2022) 45 tablet 0   methocarbamol (ROBAXIN) 500 MG tablet TAKE 1 TABLET(500 MG) BY MOUTH THREE TIMES DAILY (Patient not taking: Reported on 10/15/2022) 60 tablet 1   omeprazole (PRILOSEC) 40 MG capsule Take 1 capsule (40 mg total) by mouth daily. 90 capsule 0   oxyCODONE-acetaminophen (PERCOCET) 5-325 MG tablet Take 1 tablet by mouth every 4 (four) hours as needed for severe pain. (Patient not taking: Reported on 02/12/2022) 45 tablet 0   rivaroxaban (XARELTO) 20 MG TABS tablet TAKE 1 TABLET(20 MG) BY MOUTH DAILY 30 tablet 0   traMADol (ULTRAM) 50 MG tablet Take 1 tablet (50 mg total) by mouth every 12 (twelve) hours as needed. (Patient not taking: Reported on 05/14/2022) 30 tablet 0   No current facility-administered medications for this visit.     REVIEW OF SYSTEMS:    Review of Systems  Constitutional:  Positive for fatigue (50%). Negative for appetite change, chills, diaphoresis, fever and unexpected weight change.  HENT:   Negative for lump/mass and nosebleeds.   Eyes:  Negative for eye problems.  Respiratory:  Negative for cough, hemoptysis and shortness of breath.   Cardiovascular:  Negative for chest pain, leg swelling and palpitations.  Gastrointestinal:  Negative for abdominal pain, blood in stool, constipation, diarrhea, nausea and vomiting.  Genitourinary:  Positive for difficulty urinating (weak stream). Negative for hematuria.   Musculoskeletal:  Positive for arthralgias.  Skin: Negative.   Neurological:  Positive for numbness. Negative for dizziness, headaches and light-headedness.  Hematological:  Does not bruise/bleed easily.      PHYSICAL EXAMINATION:   ECOG PERFORMANCE STATUS: 1 - Symptomatic but completely ambulatory  There were no vitals filed for this visit. There were no vitals filed for this visit.  Physical Exam Constitutional:      Appearance: Normal appearance.  He is obese.  HENT:     Head: Normocephalic and atraumatic.     Mouth/Throat:     Mouth: Mucous membranes are moist.  Eyes:     Extraocular Movements: Extraocular movements intact.     Pupils: Pupils are equal, round, and reactive to light.  Cardiovascular:     Rate and Rhythm: Normal rate and regular rhythm.     Pulses: Normal pulses.     Heart sounds: Normal heart sounds.  Pulmonary:     Effort: Pulmonary effort is normal.     Breath sounds: Normal breath sounds.  Abdominal:     General: Bowel sounds are normal.     Palpations: Abdomen is soft.     Tenderness: There is no abdominal tenderness.  Musculoskeletal:        General: No swelling.     Right lower leg: No edema.     Left lower leg: No edema.  Lymphadenopathy:     Cervical: No cervical adenopathy.  Skin:    General: Skin is warm and dry.  Neurological:      General: No focal deficit present.     Mental Status: He is alert and oriented to person, place, and time.  Psychiatric:        Mood and Affect: Mood normal.        Behavior: Behavior normal.       LABORATORY DATA:  I have reviewed the data as listed No results found for this or any previous visit (from the past 2160 hour(s)).  RADIOGRAPHIC STUDIES: I have personally reviewed the radiological images as listed and agreed with the findings in the report. US Venous Img Lower Unilateral Right (DVT)  Result Date: 10/07/2022 CLINICAL DATA:  Previous DVT in the right lower extremity EXAM: RIGHT LOWER EXTREMITY VENOUS DOPPLER ULTRASOUND TECHNIQUE: Gray-scale sonography with compression, as well as color and duplex ultrasound, were performed to evaluate the deep venous system(s) from the level of the common femoral vein through the popliteal and proximal calf veins. COMPARISON:  None Available. FINDINGS: VENOUS Below the knee DVT of the right gastrocnemius vein is present. In addition there is some suspected peripheral chronic DVT in the distal right femoral vein potentially with exaggerated collateral venous flow extending around the margin of the right distal superficial femoral artery. However, no acute femoral vein DVT is observed. Likewise there no findings of acute DVT involving the common femoral vein or popliteal vein, or the calf veins with the exception of the gastrocnemius. Limited views of the contralateral common femoral vein are unremarkable. OTHER None. Limitations: none IMPRESSION: 1. Acute below-the-knee DVT involving the gastrocnemius vein. 2. Chronic nonocclusive DVT in the distal superficial femoral vein peripherally, potentially with some exaggerated collateral venous flow in this region. Electronically Signed   By: Van Clines M.D.   On: 10/07/2022 11:22     ASSESSMENT & PLAN:  1.  Provoked LLE DVT, incomplete resolution despite anticoagulation - Seen at the request of Dr.  Rodell Perna (orthopedic surgeon) for evaluation of hypercoagulable state in setting of persistent right leg DVT. - Provoked DVT in March 2023 following back surgery: Lumbar decompression and microdiscectomy performed by Dr. Lorin Mercy on 11/26/2021.  This was complicated by postoperative CSF leak requiring 10-day hospitalization in March 2023. Several days of strict bedrest due to CSF leak, unable to be placed on chemical DVT prophylaxis due to ongoing bleeding from wound. Vascular US on 12/16/2021 showed acute DVT of right popliteal vein, right femoral vein, and right posterior  tibial veins.   - Patient has been compliant with Xarelto since it was started on 12/17/2021. - He was temporarily off of Xarelto at the instruction of Dr. Lorin Mercy for about 4 to 6 weeks around November/December 2023 and was on aspirin 81 mg daily at that time instead.  Xarelto restarted as of 10/07/2022. - Follow-up venous ultrasounds have showed failure of initial DVT to resolve, as well as new acute on chronic blood clots: (03/23/2022): New occlusive thrombus in right femoral vein in the proximal mid thigh; previously identified thrombus within distal thigh femoral vein and calf veins remains present but is now nonocclusive (07/20/2022): No convincing evidence of acute DVT.  Interval resolution of DVT from within the femoral artery.  Chronic/subacute thrombus persists in deep gastrocnemius veins of the calf. (10/07/2022): Acute below the knee DVT involving gastrocnemius vein with chronic nonocclusive DVT and distal superficial femoral vein - No prior history of DVT or PE.  No family history of clotting disorders or miscarriages. - PLAN: Will check a hypercoagulable workup today to include PT gene mutation, factor V Leiden, lupus anticoagulant panel, anticardiolipin antibodies, beta-2 glycoprotein antibodies, D-dimer, CBC/D, CMP - Due to evident failure of Xarelto, we will transition patient to warfarin.  Extensive warfarin education  provided to patient during visit today.  Will start at 5 mg daily and follow closely with serial INR during the initiation phase. - Due to evidence of breakthrough clots while on anticoagulation, patient will likely require lifelong anticoagulation -- Repeat venous US in 3 months   2.  Other history - PMH: Hypertension, degenerative disc disease, arthritis, GERD - SOCIAL: He works at a [employer]. He is married and lives at home with his wife. He denies any tobacco, alcohol, illicit drug use.  - FAMILY: Family history negative for any clotting disorders. Patient's mother had thyroid cancer.    PLAN SUMMARY: >> Labs TODAY (beta-2 glycoprotein, cardiolipin, CBC/differential, CMP, D-dimer, factor V Leiden, lupus anticoagulant panel, PT gene mutation) >> Lab appointment [facility], starting on Monday afternoon 11/02/2022 for INR CHECK >> Referral to nutritionist for warfarin dietary education >> Same-day labs (CBC/D, CMP, INR, D-dimer) + office visit in 1 month    All questions were answered. The patient knows to call the clinic with any problems, questions or concerns.  Medical decision making: Moderate  Time spent on visit: I spent 75 minutes counseling the patient face to face. The total time spent in the appointment was 100 minutes and more than 50% was on counseling.  I, Tarri Abernethy PA-C, have seen this patient in conjunction with Dr. Derek Jack.  Greater than 50% of visit was performed by Dr. Delton Coombes.   Harriett Rush, PA-C 10/31/2022 11:30 PM  DR. KATRAGADDA: I have independently evaluated this patient and formulated my assessment and plan.  I agree with HPI, assessment and plan written by Casey Burkitt, PA-C.  This patient is evaluated for recurrent right lower extremity DVT.  The recurrences happened while he is on Xarelto.  Last Doppler on 10/07/2022 showed acute below the knee DVT involving gastrocnemius vein and chronic  nonocclusive DVT in the distal superficial femoral vein peripherally.  Doppler on 07/20/2022 showed resolution of DVT in the femoral artery with no acute DVT.  Chronic/subacute thrombus persists in the deep gastrocnemius veins in the calf.  He is otherwise active and is working full-time.  I have recommended that he discontinue Xarelto and start on Coumadin.  Will likely repeat Doppler in 3  months.  Will check for antiphospholipid syndrome, factor V Leiden and prothrombin gene mutation.

## 2022-10-29 ENCOUNTER — Ambulatory Visit: Payer: BC Managed Care – PPO | Admitting: Gastroenterology

## 2022-10-29 ENCOUNTER — Other Ambulatory Visit: Payer: Self-pay

## 2022-10-29 ENCOUNTER — Inpatient Hospital Stay: Payer: BC Managed Care – PPO | Attending: Hematology | Admitting: Hematology

## 2022-10-29 ENCOUNTER — Encounter: Payer: Self-pay | Admitting: Gastroenterology

## 2022-10-29 ENCOUNTER — Inpatient Hospital Stay: Payer: BC Managed Care – PPO

## 2022-10-29 VITALS — BP 131/82 | HR 72 | Temp 98.6°F | Resp 17 | Ht 68.0 in | Wt 245.3 lb

## 2022-10-29 VITALS — BP 139/80 | HR 96 | Temp 98.6°F | Ht 69.0 in | Wt 248.2 lb

## 2022-10-29 DIAGNOSIS — I82401 Acute embolism and thrombosis of unspecified deep veins of right lower extremity: Secondary | ICD-10-CM

## 2022-10-29 DIAGNOSIS — Z79899 Other long term (current) drug therapy: Secondary | ICD-10-CM | POA: Diagnosis not present

## 2022-10-29 DIAGNOSIS — Z808 Family history of malignant neoplasm of other organs or systems: Secondary | ICD-10-CM | POA: Insufficient documentation

## 2022-10-29 DIAGNOSIS — I82531 Chronic embolism and thrombosis of right popliteal vein: Secondary | ICD-10-CM

## 2022-10-29 DIAGNOSIS — K219 Gastro-esophageal reflux disease without esophagitis: Secondary | ICD-10-CM | POA: Insufficient documentation

## 2022-10-29 DIAGNOSIS — I82562 Chronic embolism and thrombosis of left calf muscular vein: Secondary | ICD-10-CM | POA: Insufficient documentation

## 2022-10-29 DIAGNOSIS — F17200 Nicotine dependence, unspecified, uncomplicated: Secondary | ICD-10-CM | POA: Diagnosis not present

## 2022-10-29 DIAGNOSIS — I10 Essential (primary) hypertension: Secondary | ICD-10-CM | POA: Insufficient documentation

## 2022-10-29 DIAGNOSIS — Z7901 Long term (current) use of anticoagulants: Secondary | ICD-10-CM | POA: Insufficient documentation

## 2022-10-29 MED ORDER — WARFARIN SODIUM 5 MG PO TABS: 5.0000 mg | ORAL_TABLET | Freq: Every day | ORAL | 0 refills | Status: DC

## 2022-10-29 MED ORDER — OMEPRAZOLE 40 MG PO CPDR: 40.0000 mg | DELAYED_RELEASE_CAPSULE | Freq: Every day | ORAL | 3 refills | Status: DC

## 2022-10-29 NOTE — Progress Notes (Signed)
Gastroenterology Office Note     Primary Care Physician:  Asencion Noble, MD  Primary Gastroenterologist: Dr. Abbey Chatters    Chief Complaint   Chief Complaint  Patient presents with   Follow-up    Follow up for refills on medications     History of Present Illness   Brandon Burton is a 57 y.o. male presenting today in follow-up with a history of chronic GERD, distant history of adenomas with surveillance due in 2025, here for refills.    No abdominal pain, N/V, changes in bowel habits, constipation, diarrhea, overt GI bleeding, uncontrolled GERD, dysphagia, unexplained weight loss, lack of appetite, unexplained weight gain.    Omeprazole 40 mg daily.     Past Medical History:  Diagnosis Date   DVT (deep venous thrombosis) (HCC)    GERD (gastroesophageal reflux disease)    Hypertension     Past Surgical History:  Procedure Laterality Date   COLONOSCOPY N/A 07/22/2015   SLF:7 polyps removed/mild diverticulosis/moderate internal hemorrhoids   COLONOSCOPY N/A 11/18/2018   Diverticulosis, hemorrhoids, otherwise normal. 2025 due.   EYE SURGERY     LUMBAR LAMINECTOMY N/A 12/10/2021   Procedure: LUMBAR RE-EXPLORATION;  Surgeon: [name], MD;  Location: [facility];  Service: Orthopedics;  Laterality: N/A;   LUMBAR LAMINECTOMY/DECOMPRESSION MICRODISCECTOMY N/A 11/26/2021   Procedure: Lumbar three-four, Lumbar four-five Central Decompression, Lumbar Three-Four Microdiscectomhy;  Surgeon: [name], MD;  Location: [facility];  Service: Orthopedics;  Laterality: N/A;   SHOULDER SURGERY Left 09/28/2013   fix previous clavicular fracture complication    Current Outpatient Medications  Medication Sig Dispense Refill   amLODipine (NORVASC) 10 MG tablet Take 10 mg by mouth daily.     rivaroxaban (XARELTO) 20 MG TABS tablet TAKE 1 TABLET(20 MG) BY MOUTH DAILY 30 tablet 0   tadalafil (CIALIS) 20 MG tablet SMARTSIG:1 Tablet(s) By Mouth Every 36 Hours PRN     warfarin (COUMADIN) 5  MG tablet Take 1 tablet (5 mg total) by mouth daily at 4 PM. 30 tablet 0   omeprazole (PRILOSEC) 40 MG capsule Take 1 capsule (40 mg total) by mouth daily. 90 capsule 3   No current facility-administered medications for this visit.    Allergies as of 10/29/2022   (No Known Allergies)    Family History  Problem Relation Age of Onset   Thyroid cancer Mother    Heart attack Father    Colon cancer Neg Hx    Colon polyps Neg Hx     Social History   Socioeconomic History   Marital status: Married    Spouse name: Not on file   Number of children: Not on file   Years of education: Not on file   Highest education level: Not on file  Occupational History   Not on file  Tobacco Use   Smoking status: Light Smoker    Types: Cigars   Smokeless tobacco: Never   Tobacco comments:    weekend / social  Vaping Use   Vaping Use: Never used  Substance and Sexual Activity   Alcohol use: Not Currently    Comment: social, every 1-2 months approx 1 drink per occasion   Drug use: Yes    Types: Marijuana    Comment: sometimes for pain   Sexual activity: Not on file  Other Topics Concern   Not on file  Social History Narrative   Not on file   Social Determinants of Health   Financial Resource Strain:  Not on file  Food Insecurity: Food Insecurity Present (10/29/2022)   Hunger Vital Sign    Worried About Running Out of Food in the Last Year: Sometimes true    Ran Out of Food in the Last Year: Sometimes true  Transportation Needs: No Transportation Needs (10/29/2022)   PRAPARE - Hydrologist (Medical): No    Lack of Transportation (Non-Medical): No  Physical Activity: Not on file  Stress: Not on file  Social Connections: Not on file  Intimate Partner Violence: Not At Risk (10/29/2022)   Humiliation, Afraid, Rape, and Kick questionnaire    Fear of Current or Ex-Partner: No    Emotionally Abused: No    Physically Abused: No    Sexually Abused: No     Review  of Systems   Gen: Denies any fever, chills, fatigue, weight loss, lack of appetite.  CV: Denies chest pain, heart palpitations, peripheral edema, syncope.  Resp: Denies shortness of breath at rest or with exertion. Denies wheezing or cough.  GI: Denies dysphagia or odynophagia. Denies jaundice, hematemesis, fecal incontinence. GU : Denies urinary burning, urinary frequency, urinary hesitancy MS: Denies joint pain, muscle weakness, cramps, or limitation of movement.  Derm: Denies rash, itching, dry skin Psych: Denies depression, anxiety, memory loss, and confusion Heme: Denies bruising, bleeding, and enlarged lymph nodes.   Physical Exam   BP 139/80   Pulse 96   Temp 98.6 F (37 C)   Ht '5\' 9"'$  (1.753 m)   Wt 248 lb 3.2 oz (112.6 kg)   BMI 36.65 kg/m  General:   Alert and oriented. Pleasant and cooperative. Well-nourished and well-developed.  Head:  Normocephalic and atraumatic. Eyes:  Without icterus Abdomen:  +BS, soft, non-tender and non-distended. No HSM noted. No guarding or rebound. No masses appreciated.  Rectal:  Deferred  Msk:  Symmetrical without gross deformities. Normal posture. Extremities:  Without edema. Neurologic:  Alert and  oriented x4;  grossly normal neurologically. Skin:  Intact without significant lesions or rashes. Psych:  Alert and cooperative. Normal mood and affect.   Assessment   Brandon Burton is a 57 y.o. male presenting today in follow-up with a history of  chronic GERD, distant history of adenomas with surveillance due in 2025, here for refills.   GERD remains well controlled on once daily PPI without any alarm signs/symptoms.   Colonoscopy will be due in 2025. No concerning lower GI signs/symptoms.   The patient was found to have elevated blood pressure when vital signs were checked in the office. The blood pressure was rechecked by the nursing staff, and it was found to be persistently elevated >140/90 mmHg. I personally advised the patient to  follow up closely with the PCP for hypertension control.    PLAN    Continue omeprazole once daily Refills X 1 year Return in 1 year Colonoscopy to be arranged at next visit   Annitta Needs, PhD, ANP-BC [facility] Gastroenterology

## 2022-10-29 NOTE — Patient Instructions (Signed)
Continue omeprazole daily.  We will see you in 1 year!  Your next colonoscopy is due in 2025!  It was a pleasure to see you today. I want to create trusting relationships with patients and provide genuine, compassionate, and quality care. I truly value your feedback, so please be on the lookout for a survey regarding your visit with me today. I appreciate your time in completing this!    Annitta Needs, PhD, ANP-BC [facility] Gastroenterology

## 2022-10-29 NOTE — Patient Instructions (Addendum)
[facility] at [place] **   You were seen today by Dr. Delton Coombes & Tarri Abernethy PA-C for your left leg DVT (blood clot).    DEEP VEIN THROMBOSIS (blood clot in your left leg) Since your blood clot did not get better (and actually showed evidence of new blood clots) while taking Xarelto, we will need to switch you to a new blood thinner (see below). Since you had new blood clots while you were on treatment for your first blood clot, you will be at high risk of recurrent blood clots throughout your lifetime.  Therefore, you will need to be on blood thinners indefinitely to prevent future blood clots. Please see the attached handout for additional information regarding DVT.  We are starting you and a blood thinner called WARFARIN (Coumadin). Prescription sent to your pharmacy for WARFARIN 5 mg.  Take this at the same time each evening. Take your first dose on Saturday 10/31/2022. It takes several days for warfarin to reach sufficient levels in your body to make your blood thin enough, so until you hear otherwise from our office you should also CONTINUE to take your Xarelto. We will check your INR (blood test to see how thick or thin your blood is) on Monday (11/02/2022) afternoon.  You will receive a phone call from [place] Monday afternoon/evening with further instructions about the dose of your warfarin. Many medications can interfere with warfarin.  Please let all of your other providers know that you are taking warfarin, and let us know if you stop any of your previous medications or start any new medications. Medications like Tylenol can interfere with warfarin.  If you take these medications as needed "from time to time," they should be avoided.  If you take a regular dose daily, you can continue to take them. Vitamin K, which is present in many different foods, can also cause interference with warfarin. It is important that you  eat a CONSISTENT AMOUNT OF VITAMIN K in your diet. We will refer you to a dietitian for assistance with dietary education and meal planning. Seek IMMEDIATE medical attention if you have any signs of major bleeding episodes (from your blood being too thin) or new blood clots (from your blood being too thick).  Although warfarin significantly decreases your risks of recurrent blood clots, there is a very rare chance that you could still have a blood clot while taking appropriately dosed warfarin.   **Please see the paperwork that you were given in your binder for additional details and other important information.  LABS: We will check LABS TODAY before you leave the hospital.  FOLLOW-UP APPOINTMENT: Same-day labs and office visit in 1 month.  ** Thank you for trusting me with your healthcare!  I strive to provide all of my patients with quality care at each visit.  If you receive a survey for this visit, I would be so grateful to you for taking the time to provide feedback.  Thank you in advance!  ~ Lakita Sahlin                   Dr. Derek Jack   &   Tarri Abernethy, PA-C   - - - - - - - - - - - - - - - - - -    Thank you for choosing [place] at [facility] to provide your oncology and hematology care.  To afford each patient quality  time with our provider, please arrive at least 15 minutes before your scheduled appointment time.   If you have a lab appointment with the [facility] please come in thru the Main Entrance and check in at the main information desk.  You need to re-schedule your appointment should you arrive 10 or more minutes late.  We strive to give you quality time with our providers, and arriving late affects you and other patients whose appointments are after yours.  Also, if you no show three or more times for appointments you may be dismissed from the clinic at the providers discretion.     Again, thank you for choosing [facility].  Our hope is that these requests will decrease the amount of time that you wait before being seen by our physicians.       _____________________________________________________________  Should you have questions after your visit to [facility], please contact our office at [phone] and follow the prompts.  Our office hours are 8:00 a.m. and 4:30 p.m. Monday - Friday.  Please note that voicemails left after 4:00 p.m. may not be returned until the following business day.  We are closed weekends and major holidays.  You do have access to a nurse 24-7, just call the main number to the clinic [phone] and do not press any options, hold on the line and a nurse will answer the phone.    For prescription refill requests, have your pharmacy contact our office and allow 72 hours.

## 2022-10-30 ENCOUNTER — Other Ambulatory Visit: Payer: Self-pay

## 2022-10-30 DIAGNOSIS — I82401 Acute embolism and thrombosis of unspecified deep veins of right lower extremity: Secondary | ICD-10-CM

## 2022-11-02 ENCOUNTER — Inpatient Hospital Stay: Payer: BC Managed Care – PPO

## 2022-11-03 ENCOUNTER — Inpatient Hospital Stay: Payer: BC Managed Care – PPO

## 2022-11-04 ENCOUNTER — Telehealth: Payer: Self-pay

## 2022-11-04 ENCOUNTER — Inpatient Hospital Stay: Payer: BC Managed Care – PPO

## 2022-11-04 ENCOUNTER — Encounter: Payer: Self-pay | Admitting: Physician Assistant

## 2022-11-04 NOTE — Progress Notes (Signed)
Patient was seen by myself and Dr. Raliegh Ip on 10/29/2022.  We feel that he has failed Xarelto therapy and needs to switch to warfarin.  This was discussed with patient extensively during his visit and he was provided in clinic education on warfarin.  However, patient did not follow-up for his coagulopathy labs or INR check.  He has not picked up his warfarin prescription from [facility].  He has not yet returned our messages to him.  The above information has been communicated to the referring provider (Dr. Rodell Perna).  Harriett Rush, PA-C 11/04/22 4:24 PM

## 2022-11-04 NOTE — Telephone Encounter (Signed)
Message left on patients voice mail for home and cell phone to call the cancer center for follow up regarding warfarin and lab work.

## 2022-11-04 NOTE — Telephone Encounter (Signed)
[facility] and the patient did not pick up his Warfarin prescription.

## 2022-11-05 ENCOUNTER — Other Ambulatory Visit: Payer: Self-pay | Admitting: Internal Medicine

## 2022-11-05 ENCOUNTER — Inpatient Hospital Stay: Payer: BC Managed Care – PPO

## 2022-11-06 ENCOUNTER — Inpatient Hospital Stay: Payer: BC Managed Care – PPO

## 2022-11-07 ENCOUNTER — Other Ambulatory Visit: Payer: Self-pay | Admitting: Internal Medicine

## 2022-11-15 ENCOUNTER — Other Ambulatory Visit: Payer: Self-pay | Admitting: Orthopaedic Surgery

## 2022-11-16 ENCOUNTER — Inpatient Hospital Stay: Payer: BC Managed Care – PPO | Admitting: Dietician

## 2022-11-16 NOTE — Telephone Encounter (Signed)
EPIC says Heme/onc said needs to be on warfarin due to failed Xarelto. So denied refill, they are treating

## 2022-11-16 NOTE — Progress Notes (Signed)
Patient did not show for nutrition appointment. 

## 2022-11-18 DIAGNOSIS — I82401 Acute embolism and thrombosis of unspecified deep veins of right lower extremity: Secondary | ICD-10-CM | POA: Diagnosis not present

## 2022-11-20 ENCOUNTER — Other Ambulatory Visit: Payer: Self-pay

## 2022-11-20 DIAGNOSIS — I82401 Acute embolism and thrombosis of unspecified deep veins of right lower extremity: Secondary | ICD-10-CM

## 2022-11-20 DIAGNOSIS — Z7901 Long term (current) use of anticoagulants: Secondary | ICD-10-CM

## 2022-11-23 ENCOUNTER — Inpatient Hospital Stay: Payer: BC Managed Care – PPO

## 2022-11-23 DIAGNOSIS — Z7901 Long term (current) use of anticoagulants: Secondary | ICD-10-CM | POA: Diagnosis not present

## 2022-11-23 DIAGNOSIS — Z808 Family history of malignant neoplasm of other organs or systems: Secondary | ICD-10-CM | POA: Diagnosis not present

## 2022-11-23 DIAGNOSIS — I82401 Acute embolism and thrombosis of unspecified deep veins of right lower extremity: Secondary | ICD-10-CM

## 2022-11-23 DIAGNOSIS — Z79899 Other long term (current) drug therapy: Secondary | ICD-10-CM | POA: Diagnosis not present

## 2022-11-23 DIAGNOSIS — I10 Essential (primary) hypertension: Secondary | ICD-10-CM | POA: Diagnosis not present

## 2022-11-23 DIAGNOSIS — F17200 Nicotine dependence, unspecified, uncomplicated: Secondary | ICD-10-CM | POA: Diagnosis not present

## 2022-11-23 DIAGNOSIS — I82562 Chronic embolism and thrombosis of left calf muscular vein: Secondary | ICD-10-CM | POA: Diagnosis not present

## 2022-11-23 DIAGNOSIS — K219 Gastro-esophageal reflux disease without esophagitis: Secondary | ICD-10-CM | POA: Diagnosis not present

## 2022-11-23 LAB — COMPREHENSIVE METABOLIC PANEL
ALT: 22 U/L (ref 0–44)
AST: 26 U/L (ref 15–41)
Albumin: 3.9 g/dL (ref 3.5–5.0)
Alkaline Phosphatase: 96 U/L (ref 38–126)
Anion gap: 7 (ref 5–15)
BUN: 11 mg/dL (ref 6–20)
CO2: 23 mmol/L (ref 22–32)
Calcium: 8.5 mg/dL — ABNORMAL LOW (ref 8.9–10.3)
Chloride: 108 mmol/L (ref 98–111)
Creatinine, Ser: 0.94 mg/dL (ref 0.61–1.24)
GFR, Estimated: >60 mL/min (ref >60)
Glucose, Bld: 97 mg/dL (ref 70–99)
Potassium: 3.5 mmol/L (ref 3.5–5.1)
Sodium: 138 mmol/L (ref 135–145)
Total Bilirubin: 0.4 mg/dL (ref 0.3–1.2)
Total Protein: 8 g/dL (ref 6.5–8.1)

## 2022-11-23 LAB — CBC WITH DIFFERENTIAL/PLATELET
Abs Immature Granulocytes: 0.01 10*3/uL (ref 0.00–0.07)
Basophils Absolute: 0 10*3/uL (ref 0.0–0.1)
Basophils Relative: 1 %
Eosinophils Absolute: 0.5 10*3/uL (ref 0.0–0.5)
Eosinophils Relative: 8 %
HCT: 41.9 % (ref 39.0–52.0)
Hemoglobin: 13.5 g/dL (ref 13.0–17.0)
Immature Granulocytes: 0 %
Lymphocytes Relative: 39 %
Lymphs Abs: 2.2 10*3/uL (ref 0.7–4.0)
MCH: 28.5 pg (ref 26.0–34.0)
MCHC: 32.2 g/dL (ref 30.0–36.0)
MCV: 88.6 fL (ref 80.0–100.0)
Monocytes Absolute: 0.5 10*3/uL (ref 0.1–1.0)
Monocytes Relative: 8 %
Neutro Abs: 2.6 10*3/uL (ref 1.7–7.7)
Neutrophils Relative %: 44 %
Platelets: 461 10*3/uL — ABNORMAL HIGH (ref 150–400)
RBC: 4.73 MIL/uL (ref 4.22–5.81)
RDW: 14.9 % (ref 11.5–15.5)
WBC: 5.8 10*3/uL (ref 4.0–10.5)
nRBC: 0 % (ref 0.0–0.2)

## 2022-11-23 LAB — PROTIME-INR
INR: 1.2 (ref 0.8–1.2)
Prothrombin Time: 15.2 seconds (ref 11.4–15.2)

## 2022-11-23 LAB — D-DIMER, QUANTITATIVE: D-Dimer, Quant: 0.43 ug/mL-FEU (ref 0.00–0.50)

## 2022-11-24 LAB — CARDIOLIPIN ANTIBODIES, IGG, IGM, IGA
Anticardiolipin IgA: <9 APL U/mL (ref 0–11)
Anticardiolipin IgG: <9 GPL U/mL (ref 0–14)
Anticardiolipin IgM: <9 MPL U/mL (ref 0–12)

## 2022-11-24 LAB — LUPUS ANTICOAGULANT PANEL
DRVVT: 38.3 s (ref 0.0–47.0)
PTT Lupus Anticoagulant: 36.6 s (ref 0.0–43.5)

## 2022-11-24 LAB — BETA-2-GLYCOPROTEIN I ABS, IGG/M/A
Beta-2 Glyco I IgG: <9 GPI IgG units (ref 0–20)
Beta-2-Glycoprotein I IgA: <9 GPI IgA units (ref 0–25)
Beta-2-Glycoprotein I IgM: <9 GPI IgM units (ref 0–32)

## 2022-11-30 ENCOUNTER — Ambulatory Visit: Payer: BC Managed Care – PPO | Admitting: Physician Assistant

## 2022-11-30 ENCOUNTER — Other Ambulatory Visit: Payer: BC Managed Care – PPO

## 2022-12-01 LAB — PROTHROMBIN GENE MUTATION

## 2022-12-01 NOTE — Progress Notes (Deleted)
NO SHOW

## 2022-12-02 ENCOUNTER — Inpatient Hospital Stay: Payer: BC Managed Care – PPO | Admitting: Physician Assistant

## 2022-12-04 LAB — FACTOR 5 LEIDEN

## 2022-12-14 ENCOUNTER — Ambulatory Visit: Payer: BC Managed Care – PPO | Admitting: Physician Assistant

## 2022-12-18 NOTE — Progress Notes (Unsigned)
Brandon Burton, [place] [address]   CLINIC:  Medical Oncology/Hematology  PCP:  Brandon Noble, MD [address] [place] [address] [phone]   REASON FOR VISIT:  Follow-up for RLE DVT with incomplete resolution and recurrent DVTs despite anticoagulation  CURRENT THERAPY: Xarelto  INTERVAL HISTORY:   Brandon Burton 57 y.o. male returns for routine follow-up of his RLE DVT.  He was seen for initial consultation by Dr. Delton Burton and Brandon Abernethy PA-C on 10/29/2022.   Patient is now taking warfarin as recommended by hematology clinic, with INR and warfarin dosing managed by his PCP.  He reports that he bleeds easier when cut but denies any epistaxis, hematemesis, melena, or hematochezia.  He denies any unilateral leg swelling, pain, or erythema. No shortness of breath, dyspnea on exertion, chest pain, cough, hemoptysis, or palpitations.  He has 70% energy and 100% appetite. He endorses that he is maintaining a stable weight.  ASSESSMENT & PLAN:  1.  Provoked LLE DVT, incomplete resolution despite anticoagulation - Seen at the request of Brandon Burton (orthopedic surgeon) for evaluation of hypercoagulable state in setting of persistent right leg DVT. - Provoked DVT in March 2023 following back surgery: Lumbar decompression and microdiscectomy performed by Brandon Burton on 11/26/2021.  This was complicated by postoperative CSF leak requiring 10-day hospitalization in March 2023. Several days of strict bedrest due to CSF leak, unable to be placed on chemical DVT prophylaxis due to ongoing bleeding from wound. Vascular US on 12/16/2021 showed acute DVT of right popliteal vein, right femoral vein, and right posterior tibial veins.   - Patient has been compliant with Xarelto since it was started on 12/17/2021. - He was temporarily off of Xarelto at the instruction of Brandon Burton for about 4 to 6 weeks around November/December 2023 and was on aspirin 81 mg  daily at that time instead.  Xarelto restarted as of 10/07/2022. - Follow-up venous ultrasounds have showed failure of initial DVT to resolve, as well as new acute on chronic blood clots: (03/23/2022): New occlusive thrombus in right femoral vein in the proximal mid thigh; previously identified thrombus within distal thigh femoral vein and calf veins remains present but is now nonocclusive (07/20/2022): No convincing evidence of acute DVT.  Interval resolution of DVT from within the femoral artery.  Chronic/subacute thrombus persists in deep gastrocnemius veins of the calf. (10/07/2022): Acute below the knee DVT involving gastrocnemius vein with chronic nonocclusive DVT and distal superficial femoral vein - No prior history of DVT or PE.  No family history of clotting disorders or miscarriages. - Coagulopathy workup (11/23/2022): Negative lupus anticoagulant, anticardiolipin antibodies, beta-2 glycoprotein antibodies.  Negative PT gene mutation.  Factor V Leiden negative. - D-dimer normalized at 0.43 as of 11/23/2022.   - At initial visit on 10/29/2022, patient was recommended to switch to warfarin due to evident failure of Xarelto. - Currently on warfarin (since February 2024), with INR monitoring and dose adjustments by his PCP - No major blood loss or symptoms of recurrent DVT/PE at this time - PLAN: Reinforcing education provided to patient regarding evident failure of Xarelto, and importance of transitioning patient to warfarin.  Due to evidence of breakthrough clots while on anticoagulation, patient will likely require lifelong anticoagulation - Repeat venous ultrasound 3 months after initiation of warfarin    2.  Thrombocytosis - Mild to moderate thrombocytosis since at least February 2023 with platelets ranging from 461 to 643 - PLAN: Given the fact  the patient has also had VTE that failed to resolve on DOAC, we will check MPN panel to include JAK2 with reflex and BCR/ABL.   We will check ferritin  and iron/TIBC.  3.  Other history - PMH: Hypertension, degenerative disc disease, arthritis, GERD - SOCIAL: He works at a [employer]. He is married and lives at home with his wife. He denies any tobacco, alcohol, illicit drug use.  - FAMILY: Family history negative for any clotting disorders. Patient's mother had thyroid cancer.   PLAN SUMMARY: >> Labs in 3 months = CBC/D, CMP, D-dimer, INR, ferritin, iron/TIBC, JAK2 with reflex, BCR/ABL >> Venous ultrasound right leg in 3 months >> OFFICE visit in 3 months (2 weeks after labs/ultrasound)    REVIEW OF SYSTEMS:   Review of Systems  Constitutional:  Positive for fatigue. Negative for appetite change, chills, diaphoresis, fever and unexpected weight change.  HENT:   Negative for lump/mass and nosebleeds.   Eyes:  Negative for eye problems.  Respiratory:  Negative for cough, hemoptysis and shortness of breath.   Cardiovascular:  Negative for chest pain, leg swelling and palpitations.  Gastrointestinal:  Negative for abdominal pain, blood in stool, constipation, diarrhea, nausea and vomiting.  Genitourinary:  Negative for hematuria.   Skin: Negative.   Neurological:  Positive for numbness. Negative for dizziness, headaches and light-headedness.  Hematological:  Does not bruise/bleed easily.     PHYSICAL EXAM:  ECOG PERFORMANCE STATUS: 0 - Asymptomatic  There were no vitals filed for this visit. There were no vitals filed for this visit. Physical Exam Constitutional:      Appearance: Normal appearance. He is obese.  Cardiovascular:     Heart sounds: Normal heart sounds.  Pulmonary:     Breath sounds: Normal breath sounds.  Neurological:     General: No focal deficit present.     Mental Status: Mental status is at baseline.  Psychiatric:        Behavior: Behavior normal. Behavior is cooperative.    PAST MEDICAL/SURGICAL HISTORY:  Past Medical History:  Diagnosis Date   DVT (deep venous thrombosis)  (HCC)    GERD (gastroesophageal reflux disease)    Hypertension    Past Surgical History:  Procedure Laterality Date   COLONOSCOPY N/A 07/22/2015   SLF:7 polyps removed/mild diverticulosis/moderate internal hemorrhoids   COLONOSCOPY N/A 11/18/2018   Diverticulosis, hemorrhoids, otherwise normal. 2025 due.   EYE SURGERY     LUMBAR LAMINECTOMY N/A 12/10/2021   Procedure: LUMBAR RE-EXPLORATION;  Surgeon: [name], MD;  Location: [facility];  Service: Orthopedics;  Laterality: N/A;   LUMBAR LAMINECTOMY/DECOMPRESSION MICRODISCECTOMY N/A 11/26/2021   Procedure: Lumbar three-four, Lumbar four-five Central Decompression, Lumbar Three-Four Microdiscectomhy;  Surgeon: [name], MD;  Location: [facility];  Service: Orthopedics;  Laterality: N/A;   SHOULDER SURGERY Left 09/28/2013   fix previous clavicular fracture complication    SOCIAL HISTORY:  Social History   Socioeconomic History   Marital status: Married    Spouse name: Not on file   Number of children: Not on file   Years of education: Not on file   Highest education level: Not on file  Occupational History   Not on file  Tobacco Use   Smoking status: Light Smoker    Types: Cigars   Smokeless tobacco: Never   Tobacco comments:    weekend / social  Vaping Use   Vaping Use: Never used  Substance and Sexual Activity   Alcohol use: Not Currently  Comment: social, every 1-2 months approx 1 drink per occasion   Drug use: Yes    Types: Marijuana    Comment: sometimes for pain   Sexual activity: Not on file  Other Topics Concern   Not on file  Social History Narrative   Not on file   Social Determinants of Health   Financial Resource Strain: Not on file  Food Insecurity: Food Insecurity Present (10/29/2022)   Hunger Vital Sign    Worried About Running Out of Food in the Last Year: Sometimes true    Ran Out of Food in the Last Year: Sometimes true  Transportation Needs: No Transportation Needs (10/29/2022)   PRAPARE -  Hydrologist (Medical): No    Lack of Transportation (Non-Medical): No  Physical Activity: Not on file  Stress: Not on file  Social Connections: Not on file  Intimate Partner Violence: Not At Risk (10/29/2022)   Humiliation, Afraid, Rape, and Kick questionnaire    Fear of Current or Ex-Partner: No    Emotionally Abused: No    Physically Abused: No    Sexually Abused: No    FAMILY HISTORY:  Family History  Problem Relation Age of Onset   Thyroid cancer Mother    Heart attack Father    Colon cancer Neg Hx    Colon polyps Neg Hx     CURRENT MEDICATIONS:  Outpatient Encounter Medications as of 12/21/2022  Medication Sig   amLODipine (NORVASC) 10 MG tablet Take 10 mg by mouth daily.   omeprazole (PRILOSEC) 40 MG capsule TAKE 1 CAPSULE(40 MG) BY MOUTH DAILY   rivaroxaban (XARELTO) 20 MG TABS tablet TAKE 1 TABLET(20 MG) BY MOUTH DAILY   tadalafil (CIALIS) 20 MG tablet SMARTSIG:1 Tablet(s) By Mouth Every 36 Hours PRN   warfarin (COUMADIN) 5 MG tablet Take 1 tablet (5 mg total) by mouth daily at 4 PM.   No facility-administered encounter medications on file as of 12/21/2022.    ALLERGIES:  No Known Allergies  LABORATORY DATA:  I have reviewed the labs as listed.  CBC    Component Value Date/Time   WBC 5.8 11/23/2022 1332   RBC 4.73 11/23/2022 1332   HGB 13.5 11/23/2022 1332   HCT 41.9 11/23/2022 1332   PLT 461 (H) 11/23/2022 1332   MCV 88.6 11/23/2022 1332   MCH 28.5 11/23/2022 1332   MCHC 32.2 11/23/2022 1332   RDW 14.9 11/23/2022 1332   LYMPHSABS 2.2 11/23/2022 1332   MONOABS 0.5 11/23/2022 1332   EOSABS 0.5 11/23/2022 1332   BASOSABS 0.0 11/23/2022 1332      Latest Ref Rng & Units 11/23/2022    1:32 PM 12/17/2021    7:02 AM 12/16/2021    5:44 AM  CMP  Glucose 70 - 99 mg/dL 97  105  114   BUN 6 - 20 mg/dL 11  18  15    Creatinine 0.61 - 1.24 mg/dL 0.94  1.05  0.96   Sodium 135 - 145 mmol/L 138  133  134   Potassium 3.5 - 5.1 mmol/L 3.5   4.3  4.1   Chloride 98 - 111 mmol/L 108  103  102   CO2 22 - 32 mmol/L 23  21  22    Calcium 8.9 - 10.3 mg/dL 8.5  9.0  8.5   Total Protein 6.5 - 8.1 g/dL 8.0  7.0    Total Bilirubin 0.3 - 1.2 mg/dL 0.4  0.5    Alkaline Phos 38 - 126  U/L 96  110    AST 15 - 41 U/L 26  32    ALT 0 - 44 U/L 22  54      DIAGNOSTIC IMAGING:  I have independently reviewed the relevant imaging and discussed with the patient.   WRAP UP:  All questions were answered. The patient knows to call the clinic with any problems, questions or concerns.  Medical decision making: Moderate  Time spent on visit: I spent 20 minutes counseling the patient face to face. The total time spent in the appointment was 30 minutes and more than 50% was on counseling.  Harriett Rush, PA-C  12/21/22 9:55 AM

## 2022-12-21 ENCOUNTER — Other Ambulatory Visit: Payer: Self-pay

## 2022-12-21 ENCOUNTER — Inpatient Hospital Stay: Payer: BC Managed Care – PPO | Attending: Hematology | Admitting: Physician Assistant

## 2022-12-21 VITALS — BP 133/93 | HR 89 | Temp 98.0°F | Resp 18 | Ht 69.0 in | Wt 250.0 lb

## 2022-12-21 DIAGNOSIS — F17290 Nicotine dependence, other tobacco product, uncomplicated: Secondary | ICD-10-CM | POA: Diagnosis not present

## 2022-12-21 DIAGNOSIS — I82431 Acute embolism and thrombosis of right popliteal vein: Secondary | ICD-10-CM | POA: Insufficient documentation

## 2022-12-21 DIAGNOSIS — K219 Gastro-esophageal reflux disease without esophagitis: Secondary | ICD-10-CM | POA: Insufficient documentation

## 2022-12-21 DIAGNOSIS — Z7901 Long term (current) use of anticoagulants: Secondary | ICD-10-CM | POA: Diagnosis not present

## 2022-12-21 DIAGNOSIS — I82411 Acute embolism and thrombosis of right femoral vein: Secondary | ICD-10-CM | POA: Diagnosis not present

## 2022-12-21 DIAGNOSIS — D75839 Thrombocytosis, unspecified: Secondary | ICD-10-CM | POA: Diagnosis not present

## 2022-12-21 DIAGNOSIS — I82441 Acute embolism and thrombosis of right tibial vein: Secondary | ICD-10-CM | POA: Insufficient documentation

## 2022-12-21 DIAGNOSIS — I82531 Chronic embolism and thrombosis of right popliteal vein: Secondary | ICD-10-CM

## 2022-12-21 DIAGNOSIS — I10 Essential (primary) hypertension: Secondary | ICD-10-CM | POA: Insufficient documentation

## 2022-12-21 DIAGNOSIS — Z79899 Other long term (current) drug therapy: Secondary | ICD-10-CM | POA: Insufficient documentation

## 2022-12-21 DIAGNOSIS — I82401 Acute embolism and thrombosis of unspecified deep veins of right lower extremity: Secondary | ICD-10-CM

## 2022-12-21 NOTE — Patient Instructions (Signed)
[facility] at [place] **   You were seen today by Tarri Abernethy PA-C for your blood clots.   Continue to take warfarin daily, with dose and INR monitoring by your primary care doctor. We will check labs in 3 months. We will check ultrasound of your leg in 3 months to see if your blood clot is getting better.  FOLLOW-UP APPOINTMENT: Office visit in 3 months (2 weeks after labs and ultrasound)  ** Thank you for trusting me with your healthcare!  I strive to provide all of my patients with quality care at each visit.  If you receive a survey for this visit, I would be so grateful to you for taking the time to provide feedback.  Thank you in advance!  ~ Alexah Kivett                   Dr. Derek Jack   &   Tarri Abernethy, PA-C   - - - - - - - - - - - - - - - - - -    Thank you for choosing [place] at [facility] to provide your oncology and hematology care.  To afford each patient quality time with our provider, please arrive at least 15 minutes before your scheduled appointment time.   If you have a lab appointment with the [facility] please come in thru the Main Entrance and check in at the main information desk.  You need to re-schedule your appointment should you arrive 10 or more minutes late.  We strive to give you quality time with our providers, and arriving late affects you and other patients whose appointments are after yours.  Also, if you no show three or more times for appointments you may be dismissed from the clinic at the providers discretion.     Again, thank you for choosing [facility].  Our hope is that these requests will decrease the amount of time that you wait before being seen by our physicians.       _____________________________________________________________  Should you have questions after your visit to [facility], please contact our  office at [phone] and follow the prompts.  Our office hours are 8:00 a.m. and 4:30 p.m. Monday - Friday.  Please note that voicemails left after 4:00 p.m. may not be returned until the following business day.  We are closed weekends and major holidays.  You do have access to a nurse 24-7, just call the main number to the clinic [phone] and do not press any options, hold on the line and a nurse will answer the phone.    For prescription refill requests, have your pharmacy contact our office and allow 72 hours.

## 2023-01-07 ENCOUNTER — Other Ambulatory Visit: Payer: Self-pay | Admitting: Physician Assistant

## 2023-01-07 DIAGNOSIS — I82531 Chronic embolism and thrombosis of right popliteal vein: Secondary | ICD-10-CM

## 2023-01-07 DIAGNOSIS — Z7901 Long term (current) use of anticoagulants: Secondary | ICD-10-CM

## 2023-01-07 NOTE — Telephone Encounter (Signed)
Refill request

## 2023-01-12 ENCOUNTER — Other Ambulatory Visit: Payer: Self-pay | Admitting: *Deleted

## 2023-01-12 ENCOUNTER — Other Ambulatory Visit: Payer: Self-pay

## 2023-01-12 ENCOUNTER — Telehealth: Payer: Self-pay | Admitting: *Deleted

## 2023-01-12 DIAGNOSIS — I82531 Chronic embolism and thrombosis of right popliteal vein: Secondary | ICD-10-CM

## 2023-01-12 DIAGNOSIS — Z7901 Long term (current) use of anticoagulants: Secondary | ICD-10-CM

## 2023-01-12 NOTE — Telephone Encounter (Signed)
Warfarin and INR checks are being managed by patient's PCP.  PCP needs to send Rx for appropriate dose to pharmacy.

## 2023-01-12 NOTE — Telephone Encounter (Signed)
Received telephone call from wife stating that patient was out of warfarin and needs a refill.  Explained to her that we do not manage coumadin dosing.  It was understood that this would be managed by PCP and she would need to contact Dr. Ouida Sills to arrange for INR and refills on medication.  Rojelio Brenner PA-C made aware.

## 2023-01-13 ENCOUNTER — Telehealth: Payer: Self-pay | Admitting: *Deleted

## 2023-01-13 ENCOUNTER — Other Ambulatory Visit: Payer: Self-pay | Admitting: Physician Assistant

## 2023-01-13 DIAGNOSIS — I82531 Chronic embolism and thrombosis of right popliteal vein: Secondary | ICD-10-CM

## 2023-01-13 DIAGNOSIS — Z7901 Long term (current) use of anticoagulants: Secondary | ICD-10-CM

## 2023-01-13 MED ORDER — WARFARIN SODIUM 5 MG PO TABS: 5.0000 mg | ORAL_TABLET | Freq: Every day | ORAL | 0 refills | Status: DC

## 2023-01-13 NOTE — Telephone Encounter (Signed)
Call made to Dr. Alonza Smoker office to clarify if he was managing patient's coumadin dosing, as patient had mentioned at previous visits that he was doing so.  Per Dr. Ouida Sills, he will not be managing and has not been.  After coordinating with Rojelio Brenner, PAC, we will refill coumadin and check INR until patient can be transitioned to the coumadin clinic.  Wife made aware and education completed regarding importance of taking coumadin every day with no missed doses, as patient has not refilled since initial script in February.  States he has taken every day for the past week.  Will check INR tomorrow and adjust as needed.

## 2023-01-13 NOTE — Progress Notes (Signed)
At last visit, patient had informed us that his primary care provider (Dr. Ouida Sills) was checking his INR and managing his warfarin dosing.  Therefore, warfarin refills have been deferred to PCP.  However, when our Cancer Center staff called and spoke with Dr. Alonza Smoker nurse, she stated that his office was not managing patient's INR or warfarin dosing.  Refill for warfarin 5 mg sent to pharmacy and patient to be given strict instructions that he must present to [facility] tomorrow for INR check.  We will monitor INR in the short-term, but will place referral to cardiology for Coumadin Clinic for long-term warfarin management.  Carnella Guadalajara, PA-C  01/13/23 12:26 PM

## 2023-01-14 ENCOUNTER — Other Ambulatory Visit: Payer: Self-pay

## 2023-01-14 ENCOUNTER — Inpatient Hospital Stay: Payer: BC Managed Care – PPO | Attending: Hematology | Admitting: Physician Assistant

## 2023-01-14 DIAGNOSIS — Z7901 Long term (current) use of anticoagulants: Secondary | ICD-10-CM

## 2023-01-14 DIAGNOSIS — I82562 Chronic embolism and thrombosis of left calf muscular vein: Secondary | ICD-10-CM | POA: Diagnosis not present

## 2023-01-14 LAB — PROTIME-INR
INR: 1.2 (ref 0.8–1.2)
Prothrombin Time: 15.3 seconds — ABNORMAL HIGH (ref 11.4–15.2)

## 2023-01-14 NOTE — Progress Notes (Signed)
Patient admits that he is not 100% sure he has taken coumadin nightly.  Per Rojelio Brenner [facility] patient advised to take 5 mg every single night and we will reschedule for INR on Monday.

## 2023-01-15 ENCOUNTER — Other Ambulatory Visit: Payer: Self-pay

## 2023-01-15 ENCOUNTER — Inpatient Hospital Stay: Payer: BC Managed Care – PPO

## 2023-01-15 DIAGNOSIS — Z7901 Long term (current) use of anticoagulants: Secondary | ICD-10-CM

## 2023-01-18 ENCOUNTER — Inpatient Hospital Stay (HOSPITAL_BASED_OUTPATIENT_CLINIC_OR_DEPARTMENT_OTHER): Payer: BC Managed Care – PPO | Admitting: Physician Assistant

## 2023-01-18 DIAGNOSIS — I82562 Chronic embolism and thrombosis of left calf muscular vein: Secondary | ICD-10-CM | POA: Diagnosis not present

## 2023-01-18 DIAGNOSIS — Z7901 Long term (current) use of anticoagulants: Secondary | ICD-10-CM

## 2023-01-18 LAB — PROTIME-INR
INR: 1.6 — ABNORMAL HIGH (ref 0.8–1.2)
Prothrombin Time: 18.7 seconds — ABNORMAL HIGH (ref 11.4–15.2)

## 2023-01-18 NOTE — Progress Notes (Signed)
Please Advise

## 2023-01-18 NOTE — Progress Notes (Signed)
If he has been compliant with warfarin 5 mg QHS, please instruct him to take warfarin 7.5 mg (one-and-a-half tablets) QHS with repeat INR on Thursday or Friday this week.

## 2023-01-19 ENCOUNTER — Telehealth: Payer: Self-pay

## 2023-01-19 ENCOUNTER — Other Ambulatory Visit: Payer: BC Managed Care – PPO

## 2023-01-19 NOTE — Telephone Encounter (Signed)
-----   Message from Rebekah M Pennington, PA-C sent at 01/19/2023  9:54 AM EDT ----- If he has been compliant with warfarin 5 mg QHS, please instruct him to take warfarin 7.5 mg (one-and-a-half tablets) QHS with repeat INR on Thursday or Friday this week. 

## 2023-01-19 NOTE — Telephone Encounter (Signed)
Message left on patients voicemail Monday and today, January 18, 2022, to call for Warfarin directions and to schedule another lab appointment.

## 2023-01-19 NOTE — Telephone Encounter (Signed)
-----   Message from Carnella Guadalajara, [place] sent at 01/19/2023  9:54 AM EDT ----- If he has been compliant with warfarin 5 mg QHS, please instruct him to take warfarin 7.5 mg (one-and-a-half tablets) QHS with repeat INR on Thursday or Friday this week.

## 2023-01-19 NOTE — Telephone Encounter (Signed)
Spoke with the patient and his wife.  Patient states he has been taking the Warfarin  every night.  Reviewed new directions for Warfarin 7.5 mg and to recheck labs.  Patient is coming Friday at 1145.  Patient and wife verbalized understanding.

## 2023-01-21 ENCOUNTER — Other Ambulatory Visit: Payer: Self-pay

## 2023-01-21 DIAGNOSIS — Z7901 Long term (current) use of anticoagulants: Secondary | ICD-10-CM

## 2023-01-22 ENCOUNTER — Other Ambulatory Visit: Payer: BC Managed Care – PPO

## 2023-01-22 ENCOUNTER — Inpatient Hospital Stay (HOSPITAL_BASED_OUTPATIENT_CLINIC_OR_DEPARTMENT_OTHER): Payer: BC Managed Care – PPO | Admitting: Physician Assistant

## 2023-01-22 DIAGNOSIS — I82562 Chronic embolism and thrombosis of left calf muscular vein: Secondary | ICD-10-CM | POA: Diagnosis not present

## 2023-01-22 DIAGNOSIS — Z7901 Long term (current) use of anticoagulants: Secondary | ICD-10-CM

## 2023-01-22 LAB — PROTIME-INR
INR: 1.6 — ABNORMAL HIGH (ref 0.8–1.2)
Prothrombin Time: 18.7 seconds — ABNORMAL HIGH (ref 11.4–15.2)

## 2023-01-22 NOTE — Progress Notes (Signed)
Patient aware and agreeable with plan to increase to 10 mg QHS and will repeat his labs on either Monday or Tuesday. Schedulers will make that appointment.

## 2023-01-22 NOTE — Progress Notes (Signed)
Called patient and made him aware that his blood is still too thick. He states that he has been taking the 7.5 mg but that he has only taken it for two nights since you sent it in. He denies missing any doses of the warfarin.  Do you still want him to increase the dose to the 10 mg nightly?

## 2023-01-26 ENCOUNTER — Other Ambulatory Visit: Payer: Self-pay

## 2023-01-26 DIAGNOSIS — Z7901 Long term (current) use of anticoagulants: Secondary | ICD-10-CM

## 2023-01-27 ENCOUNTER — Encounter: Payer: Self-pay | Admitting: Internal Medicine

## 2023-01-27 ENCOUNTER — Inpatient Hospital Stay: Payer: BC Managed Care – PPO | Attending: Hematology

## 2023-01-27 ENCOUNTER — Ambulatory Visit: Payer: BC Managed Care – PPO | Attending: Internal Medicine | Admitting: Internal Medicine

## 2023-01-27 VITALS — BP 130/81 | HR 77 | Ht 69.0 in | Wt 248.8 lb

## 2023-01-27 DIAGNOSIS — I10 Essential (primary) hypertension: Secondary | ICD-10-CM | POA: Diagnosis not present

## 2023-01-27 DIAGNOSIS — Z7901 Long term (current) use of anticoagulants: Secondary | ICD-10-CM

## 2023-01-27 DIAGNOSIS — I82401 Acute embolism and thrombosis of unspecified deep veins of right lower extremity: Secondary | ICD-10-CM

## 2023-01-27 NOTE — Progress Notes (Signed)
Cardiology Office Note  Date: 01/27/2023   ID: Brandon Burton, DOB 1966/09/04, MRN [id number]  PCP:  Brandon Perches, MD  Cardiologist:  Brandon Bicker, MD Electrophysiologist:  None   Reason for Office Visit: Management of Coumadin   History of Present Illness: Brandon Burton is a 57 y.o. male known to have DVT was referred to cardiology clinic for management of Coumadin.  Patient had persistent DVT despite using Xarelto. Plan was made to switch Xarelto to Coumadin and hence patient was referred to cardiology clinic for management of Coumadin. Hypercoagulable workup was also requested by oncology, antiphospholipid syndrome, factor V Leyden and prothrombin gene mutation.  From cardiac standpoint, patient is asymptomatic.  No angina, DOE, palpitations, syncope, leg swelling.  Past Medical History:  Diagnosis Date   DVT (deep venous thrombosis) (HCC)    GERD (gastroesophageal reflux disease)    Hypertension     Past Surgical History:  Procedure Laterality Date   COLONOSCOPY N/A 07/22/2015   SLF:7 polyps removed/mild diverticulosis/moderate internal hemorrhoids   COLONOSCOPY N/A 11/18/2018   Diverticulosis, hemorrhoids, otherwise normal. 2025 due.   EYE SURGERY     LUMBAR LAMINECTOMY N/A 12/10/2021   Procedure: LUMBAR RE-EXPLORATION;  Surgeon: Brandon Manges, MD;  Location: [facility] OR;  Service: Orthopedics;  Laterality: N/A;   LUMBAR LAMINECTOMY/DECOMPRESSION MICRODISCECTOMY N/A 11/26/2021   Procedure: Lumbar three-four, Lumbar four-five Central Decompression, Lumbar Three-Four Microdiscectomhy;  Surgeon: Brandon Manges, MD;  Location: [facility] OR;  Service: Orthopedics;  Laterality: N/A;   SHOULDER SURGERY Left 09/28/2013   fix previous clavicular fracture complication    Current Outpatient Medications  Medication Sig Dispense Refill   amLODipine (NORVASC) 10 MG tablet Take 10 mg by mouth daily.     amoxicillin (AMOXIL) 500 MG capsule Take 500 mg by mouth 4 (four) times daily.      omeprazole (PRILOSEC) 40 MG capsule TAKE 1 CAPSULE(40 MG) BY MOUTH DAILY 90 capsule 1   tadalafil (CIALIS) 20 MG tablet SMARTSIG:1 Tablet(s) By Mouth Every 36 Hours PRN     warfarin (COUMADIN) 5 MG tablet Take 1 tablet (5 mg total) by mouth daily at 4 PM. 30 tablet 0   No current facility-administered medications for this visit.   Allergies:  Patient has no known allergies.   Social History: The patient  reports that he has been smoking cigars. He has never used smokeless tobacco. He reports that he does not currently use alcohol. He reports current drug use. Drug: Marijuana.   Family History: The patient's family history includes Heart attack in his father; Thyroid cancer in his mother.   ROS:  Please see the history of present illness. Otherwise, complete review of systems is positive for none.  All other systems are reviewed and negative.   Physical Exam: VS:  BP 130/81 (BP Location: Left Arm, Patient Position: Sitting, Cuff Size: Normal)   Pulse 77   Ht 5\' 9"  (1.753 m)   Wt 248 lb 12.8 oz (112.9 kg)   SpO2 95%   BMI 36.74 kg/m , BMI Body mass index is 36.74 kg/m.  Wt Readings from Last 3 Encounters:  01/27/23 248 lb 12.8 oz (112.9 kg)  12/21/22 250 lb (113.4 kg)  10/29/22 248 lb 3.2 oz (112.6 kg)    General: Patient appears comfortable at rest. HEENT: Conjunctiva and lids normal, oropharynx clear with moist mucosa. Neck: Supple, no elevated JVP or carotid bruits, no thyromegaly. Lungs: Clear to auscultation, nonlabored breathing at rest. Cardiac: Regular rate and rhythm, no S3 or  significant systolic murmur, no pericardial rub. Abdomen: Soft, nontender, no hepatomegaly, bowel sounds present, no guarding or rebound. Extremities: No pitting edema, distal pulses 2+. Skin: Warm and dry. Musculoskeletal: No kyphosis. Neuropsychiatric: Alert and oriented x3, affect grossly appropriate.  Recent Labwork: 11/23/2022: ALT 22; AST 26; BUN 11; Creatinine, Ser 0.94; Hemoglobin 13.5;  Platelets 461; Potassium 3.5; Sodium 138  No results found for: "CHOL", "TRIG", "HDL", "CHOLHDL", "VLDL", "LDLCALC", "LDLDIRECT"  Other Studies Reviewed Today:   Assessment and Plan: Patient is a 58 year old M known to have HTN, persistent DVT on Xarelto was referred to cardiology clinic for management of Coumadin.  # Persistent DVT on Xarelto: Patient continues to have DVT despite being on Xarelto.  Xarelto was switched to Coumadin by oncology. Will place ambulatory referral to anticoagulation clinic in [place]. Can start with Coumadin 3 mg once a day.  # HTN, controlled: Continue amlodipine 10 mg once daily   I have spent a total of 45 minutes with patient reviewing chart, EKGs, labs and examining patient as well as establishing an assessment and plan that was discussed with the patient.  > 50% of time was spent in direct patient care.    Medication Adjustments/Labs and Tests Ordered: Current medicines are reviewed at length with the patient today.  Concerns regarding medicines are outlined above.   Tests Ordered: Orders Placed This Encounter  Procedures   Ambulatory referral to Anticoagulation Monitoring   EKG 12-Lead    Medication Changes: No orders of the defined types were placed in this encounter.   Disposition:  Follow up prn  Signed, Brandon Hearty Verne Spurr, MD, 01/27/2023 1:25 PM    [facility] at [facility] [address], [place], [place] [address]

## 2023-01-27 NOTE — Patient Instructions (Signed)
Medication Instructions:  Your physician recommends that you continue on your current medications as directed. Please refer to the Current Medication list given to you today.  *If you need a refill on your cardiac medications before your next appointment, please call your pharmacy*   Lab Work: NONE   If you have labs (blood work) drawn today and your tests are completely normal, you will receive your results only by: MyChart Message (if you have MyChart) OR A paper copy in the mail If you have any lab test that is abnormal or we need to change your treatment, we will call you to review the results.   Testing/Procedures: NONE    Follow-Up: At [facility], you and your health needs are our priority.  As part of our continuing mission to provide you with exceptional heart care, we have created designated Provider Care Teams.  These Care Teams include your primary Cardiologist (physician) and Advanced Practice Providers (APPs -  Physician Assistants and Nurse Practitioners) who all work together to provide you with the care you need, when you need it.  We recommend signing up for the patient portal called "MyChart".  Sign up information is provided on this After Visit Summary.  MyChart is used to connect with patients for Virtual Visits (Telemedicine).  Patients are able to view lab/test results, encounter notes, upcoming appointments, etc.  Non-urgent messages can be sent to your provider as well.   To learn more about what you can do with MyChart, go to [email or web address].    Your next appointment:    As Needed   Provider:   Vishnu Mallipeddi, MD    Other Instructions Thank you for choosing [facility]!    

## 2023-02-02 ENCOUNTER — Ambulatory Visit: Payer: BC Managed Care – PPO | Attending: Internal Medicine | Admitting: *Deleted

## 2023-02-02 DIAGNOSIS — I82531 Chronic embolism and thrombosis of right popliteal vein: Secondary | ICD-10-CM

## 2023-02-02 DIAGNOSIS — I82401 Acute embolism and thrombosis of unspecified deep veins of right lower extremity: Secondary | ICD-10-CM | POA: Diagnosis not present

## 2023-02-02 DIAGNOSIS — Z7901 Long term (current) use of anticoagulants: Secondary | ICD-10-CM | POA: Diagnosis not present

## 2023-02-02 LAB — POCT INR: INR: 4.1 — AB (ref 2.0–3.0)

## 2023-02-02 MED ORDER — WARFARIN SODIUM 5 MG PO TABS
ORAL_TABLET | ORAL | 3 refills | Status: DC
Start: 2023-02-02 — End: 2023-08-16

## 2023-02-02 NOTE — Patient Instructions (Addendum)
Has been on warfarin 10mg  daily for about 2 wks. Hold warfarin tonight then decrease dose to 2 tablets daily except 1 tablet on Mondays, Wednesdays and Fridays. Recheck in 10 days

## 2023-02-15 ENCOUNTER — Ambulatory Visit: Payer: BC Managed Care – PPO | Attending: Internal Medicine | Admitting: *Deleted

## 2023-02-15 DIAGNOSIS — I82401 Acute embolism and thrombosis of unspecified deep veins of right lower extremity: Secondary | ICD-10-CM

## 2023-02-15 DIAGNOSIS — Z7901 Long term (current) use of anticoagulants: Secondary | ICD-10-CM

## 2023-02-15 DIAGNOSIS — I824Y9 Acute embolism and thrombosis of unspecified deep veins of unspecified proximal lower extremity: Secondary | ICD-10-CM | POA: Insufficient documentation

## 2023-02-15 LAB — POCT INR: INR: 3.5 — AB (ref 2.0–3.0)

## 2023-02-15 NOTE — Patient Instructions (Signed)
Hold warfarin tonight then decrease dose to 1 tablet daily except 2 tablets on Sundays, Tuesdays and Thursdays Recheck in 2 wks 

## 2023-03-01 ENCOUNTER — Ambulatory Visit: Payer: BC Managed Care – PPO | Attending: Internal Medicine | Admitting: *Deleted

## 2023-03-01 DIAGNOSIS — Z7901 Long term (current) use of anticoagulants: Secondary | ICD-10-CM | POA: Diagnosis not present

## 2023-03-01 DIAGNOSIS — I82401 Acute embolism and thrombosis of unspecified deep veins of right lower extremity: Secondary | ICD-10-CM

## 2023-03-01 LAB — POCT INR: INR: 2.9 (ref 2.0–3.0)

## 2023-03-01 NOTE — Patient Instructions (Signed)
Continue warfarin 1 tablet daily except 2 tablets on Sundays, Tuesdays and Thursdays. Recheck in 3 wks

## 2023-03-08 ENCOUNTER — Ambulatory Visit (HOSPITAL_COMMUNITY): Payer: BC Managed Care – PPO | Attending: Physician Assistant

## 2023-03-22 ENCOUNTER — Other Ambulatory Visit: Payer: Self-pay

## 2023-03-22 DIAGNOSIS — Z7901 Long term (current) use of anticoagulants: Secondary | ICD-10-CM

## 2023-03-23 ENCOUNTER — Inpatient Hospital Stay: Payer: BC Managed Care – PPO | Attending: Hematology

## 2023-03-23 ENCOUNTER — Inpatient Hospital Stay: Payer: BC Managed Care – PPO | Admitting: Physician Assistant

## 2023-03-23 ENCOUNTER — Telehealth: Payer: Self-pay | Admitting: Internal Medicine

## 2023-03-23 NOTE — Telephone Encounter (Signed)
Pt's spouse is requesting a callback from [facility] regarding pt's appt tomorrow, an office trying to schedule pt to have an ultrasound done and a question regarding meds. Please advise

## 2023-03-23 NOTE — Telephone Encounter (Signed)
Spoke with wife.  Confirmed INR appt for tomorrow.  Questions answered.

## 2023-03-24 ENCOUNTER — Ambulatory Visit: Payer: BC Managed Care – PPO | Attending: Internal Medicine | Admitting: *Deleted

## 2023-03-24 DIAGNOSIS — I82401 Acute embolism and thrombosis of unspecified deep veins of right lower extremity: Secondary | ICD-10-CM

## 2023-03-24 DIAGNOSIS — Z7901 Long term (current) use of anticoagulants: Secondary | ICD-10-CM

## 2023-03-24 LAB — POCT INR: INR: 3 (ref 2.0–3.0)

## 2023-03-24 NOTE — Patient Instructions (Signed)
Take warfarin 1/2 tablet tonight then resume 1 tablet daily except 2 tablets on Sundays, Tuesdays and Thursdays. Recheck in 3 wks Having 2 wisdom teeth extracted tomorrow.

## 2023-04-02 ENCOUNTER — Ambulatory Visit (HOSPITAL_COMMUNITY): Admission: RE | Admit: 2023-04-02 | Payer: BC Managed Care – PPO | Source: Ambulatory Visit

## 2023-04-09 ENCOUNTER — Ambulatory Visit (HOSPITAL_COMMUNITY): Payer: BC Managed Care – PPO

## 2023-04-12 ENCOUNTER — Telehealth: Payer: Self-pay | Admitting: Internal Medicine

## 2023-04-12 ENCOUNTER — Ambulatory Visit (HOSPITAL_COMMUNITY): Payer: BC Managed Care – PPO

## 2023-04-12 NOTE — Telephone Encounter (Signed)
Called and spoke with wife.  Pt needs to cancel INR appt for tomorrow and reschedule due to transportation issues.  Rescheduled for 7/29 after ultrasound per pt request.

## 2023-04-12 NOTE — Telephone Encounter (Signed)
Pt wife called in asking to speak with you, whenever you get a chance, please.

## 2023-04-22 ENCOUNTER — Ambulatory Visit (HOSPITAL_COMMUNITY)
Admission: RE | Admit: 2023-04-22 | Discharge: 2023-04-22 | Disposition: A | Discharge status: Home / Self Care | Payer: BC Managed Care – PPO | Source: Ambulatory Visit | Attending: Physician Assistant | Admitting: Physician Assistant

## 2023-04-22 DIAGNOSIS — I82401 Acute embolism and thrombosis of unspecified deep veins of right lower extremity: Secondary | ICD-10-CM | POA: Diagnosis not present

## 2023-04-22 DIAGNOSIS — Z86718 Personal history of other venous thrombosis and embolism: Secondary | ICD-10-CM | POA: Diagnosis not present

## 2023-05-04 ENCOUNTER — Ambulatory Visit: Payer: BC Managed Care – PPO

## 2023-05-11 ENCOUNTER — Ambulatory Visit: Payer: BC Managed Care – PPO | Attending: Internal Medicine | Admitting: *Deleted

## 2023-05-11 DIAGNOSIS — I82401 Acute embolism and thrombosis of unspecified deep veins of right lower extremity: Secondary | ICD-10-CM | POA: Diagnosis not present

## 2023-05-11 DIAGNOSIS — Z7901 Long term (current) use of anticoagulants: Secondary | ICD-10-CM

## 2023-05-11 LAB — POCT INR: INR: 1.2 — AB (ref 2.0–3.0)

## 2023-05-11 NOTE — Patient Instructions (Signed)
Take warfarin 3 tablets tonight, 2 tablets tomorrow night then resume 1 tablet daily except 2 tablets on Sundays, Tuesdays and Thursdays. Recheck in 2 wks

## 2023-05-25 ENCOUNTER — Ambulatory Visit: Payer: BC Managed Care – PPO | Attending: Internal Medicine | Admitting: *Deleted

## 2023-05-25 DIAGNOSIS — Z7901 Long term (current) use of anticoagulants: Secondary | ICD-10-CM

## 2023-05-25 DIAGNOSIS — I82401 Acute embolism and thrombosis of unspecified deep veins of right lower extremity: Secondary | ICD-10-CM | POA: Diagnosis not present

## 2023-05-25 LAB — POCT INR: INR: 6 — AB (ref 2.0–3.0)

## 2023-05-25 NOTE — Patient Instructions (Addendum)
Hold warfarin tonight and tomorrow night, take 1 tablet Thursday then resume 1 tablet daily except 2 tablets on Sundays, Tuesdays and Thursdays. Recheck in 1 wk Denies S/S of bleeding or excessive bruising.  Bleeding and fall precautions discussed with pt and wife.  They verbalized understanding.

## 2023-05-27 ENCOUNTER — Ambulatory Visit: Payer: BC Managed Care – PPO | Admitting: Orthopedic Surgery

## 2023-06-02 ENCOUNTER — Ambulatory Visit: Payer: BC Managed Care – PPO | Attending: Internal Medicine | Admitting: *Deleted

## 2023-06-02 DIAGNOSIS — I82401 Acute embolism and thrombosis of unspecified deep veins of right lower extremity: Secondary | ICD-10-CM

## 2023-06-02 DIAGNOSIS — Z7901 Long term (current) use of anticoagulants: Secondary | ICD-10-CM | POA: Diagnosis not present

## 2023-06-02 LAB — POCT INR: INR: 3 (ref 2.0–3.0)

## 2023-06-02 NOTE — Patient Instructions (Signed)
Decrease warfarin to 1 tablet daily except 2 tablets on Sundays and Thursdays. Recheck in 2 wk

## 2023-06-16 ENCOUNTER — Telehealth: Payer: Self-pay | Admitting: *Deleted

## 2023-06-16 NOTE — Telephone Encounter (Signed)
Pt missed appt today to have INR checked. Called pt and LMOM to call clinic back and make an appt.

## 2023-06-17 ENCOUNTER — Ambulatory Visit: Payer: BC Managed Care – PPO | Attending: Internal Medicine

## 2023-06-17 DIAGNOSIS — Z7901 Long term (current) use of anticoagulants: Secondary | ICD-10-CM | POA: Diagnosis not present

## 2023-06-17 DIAGNOSIS — I824Y9 Acute embolism and thrombosis of unspecified deep veins of unspecified proximal lower extremity: Secondary | ICD-10-CM

## 2023-06-17 DIAGNOSIS — I82401 Acute embolism and thrombosis of unspecified deep veins of right lower extremity: Secondary | ICD-10-CM

## 2023-06-17 LAB — POCT INR: INR: 3.1 — AB (ref 2.0–3.0)

## 2023-06-17 NOTE — Patient Instructions (Signed)
Description   Decrease warfarin to 1 tablet daily except 2 tablets on Sundays.. Recheck in 3 wk

## 2023-07-14 ENCOUNTER — Ambulatory Visit: Payer: BC Managed Care – PPO | Attending: Internal Medicine | Admitting: *Deleted

## 2023-07-14 DIAGNOSIS — Z7901 Long term (current) use of anticoagulants: Secondary | ICD-10-CM | POA: Diagnosis not present

## 2023-07-14 DIAGNOSIS — I82401 Acute embolism and thrombosis of unspecified deep veins of right lower extremity: Secondary | ICD-10-CM

## 2023-07-14 LAB — POCT INR: INR: 3.6 — AB (ref 2.0–3.0)

## 2023-07-14 NOTE — Patient Instructions (Signed)
Hold warfarin tonight then decrease dose to 1 tablet daily. Recheck in 3 wk

## 2023-08-03 ENCOUNTER — Ambulatory Visit: Payer: BC Managed Care – PPO | Attending: Internal Medicine | Admitting: *Deleted

## 2023-08-03 DIAGNOSIS — Z7901 Long term (current) use of anticoagulants: Secondary | ICD-10-CM | POA: Diagnosis not present

## 2023-08-03 DIAGNOSIS — I82401 Acute embolism and thrombosis of unspecified deep veins of right lower extremity: Secondary | ICD-10-CM | POA: Diagnosis not present

## 2023-08-03 LAB — POCT INR: INR: 3 (ref 2.0–3.0)

## 2023-08-03 NOTE — Patient Instructions (Signed)
Continue warfarin 1 tablet daily  Recheck in 4 wk

## 2023-08-04 ENCOUNTER — Other Ambulatory Visit: Payer: Self-pay | Admitting: Orthopedic Surgery

## 2023-08-04 DIAGNOSIS — M5127 Other intervertebral disc displacement, lumbosacral region: Secondary | ICD-10-CM

## 2023-08-15 ENCOUNTER — Other Ambulatory Visit: Payer: Self-pay | Admitting: Internal Medicine

## 2023-08-15 DIAGNOSIS — Z7901 Long term (current) use of anticoagulants: Secondary | ICD-10-CM

## 2023-08-15 DIAGNOSIS — I82531 Chronic embolism and thrombosis of right popliteal vein: Secondary | ICD-10-CM

## 2023-08-16 NOTE — Telephone Encounter (Signed)
Refill request for warfarin:  Last INR was 3.0 on 08/03/23 Next INR due 08/31/23 LOV was 01/27/23  Refill approved.

## 2023-09-09 ENCOUNTER — Ambulatory Visit: Payer: BC Managed Care – PPO

## 2023-09-13 ENCOUNTER — Ambulatory Visit: Payer: BC Managed Care – PPO | Attending: Internal Medicine | Admitting: *Deleted

## 2023-09-13 DIAGNOSIS — I82401 Acute embolism and thrombosis of unspecified deep veins of right lower extremity: Secondary | ICD-10-CM | POA: Diagnosis not present

## 2023-09-13 DIAGNOSIS — Z7901 Long term (current) use of anticoagulants: Secondary | ICD-10-CM | POA: Diagnosis not present

## 2023-09-13 DIAGNOSIS — I82531 Chronic embolism and thrombosis of right popliteal vein: Secondary | ICD-10-CM

## 2023-09-13 LAB — POCT INR: INR: 1.5 — AB (ref 2.0–3.0)

## 2023-09-13 MED ORDER — WARFARIN SODIUM 5 MG PO TABS: ORAL_TABLET | ORAL | 3 refills | Status: DC

## 2023-09-13 NOTE — Patient Instructions (Signed)
Take warfarin 1 1/2 tablets tonight and tomorrow night then resume 1 tablet daily. Recheck in 3 wks

## 2023-09-17 ENCOUNTER — Encounter: Payer: Self-pay | Admitting: Gastroenterology

## 2023-10-06 ENCOUNTER — Ambulatory Visit: Payer: BC Managed Care – PPO | Attending: Internal Medicine

## 2023-10-08 DIAGNOSIS — M1990 Unspecified osteoarthritis, unspecified site: Secondary | ICD-10-CM | POA: Insufficient documentation

## 2023-10-08 NOTE — Progress Notes (Signed)
 Left knee   BP 124/73   Pulse 88   There is no height or weight on file to calculate BMI.  Chief Complaint  Patient presents with   Knee Pain    Left knee  *on Warfarin for DVT   No diagnosis found.  DOI/DOS/ Date: 10/03/23 slipped and having back and knee spasms and the cold weather makes it worse   Back surgery: Preop diagnosis: Severe two-level lumbar spinal stenosis with neurogenic claudication L3-4, L4-5 with L3-4 disc extrusion.  Postop diagnosis: Same.  Procedure: L3-4, L4-5 decompression. Near complete laminectomy L3, completed L4, complete laminectomy L5. Repair of 3 mm Intra-Op durotomy with primary repair sutures. Right L3-4 microdiscectomy removal of extruded fragment.

## 2023-10-11 ENCOUNTER — Ambulatory Visit: Payer: BC Managed Care – PPO | Admitting: Orthopedic Surgery

## 2023-10-11 VITALS — BP 124/73 | HR 88

## 2023-10-11 DIAGNOSIS — A599 Trichomoniasis, unspecified: Secondary | ICD-10-CM | POA: Diagnosis not present

## 2023-10-11 DIAGNOSIS — I82401 Acute embolism and thrombosis of unspecified deep veins of right lower extremity: Secondary | ICD-10-CM | POA: Diagnosis not present

## 2023-10-11 DIAGNOSIS — M1712 Unilateral primary osteoarthritis, left knee: Secondary | ICD-10-CM

## 2023-10-11 DIAGNOSIS — Z9889 Other specified postprocedural states: Secondary | ICD-10-CM

## 2023-10-11 DIAGNOSIS — M48062 Spinal stenosis, lumbar region with neurogenic claudication: Secondary | ICD-10-CM | POA: Diagnosis not present

## 2023-10-11 DIAGNOSIS — M25551 Pain in right hip: Secondary | ICD-10-CM

## 2023-10-11 NOTE — Progress Notes (Signed)
 Left knee   BP 124/73   Pulse 88   There is no height or weight on file to calculate BMI.  Chief Complaint  Patient presents with   Knee Pain    Left knee  *on Warfarin for DVT   Encounter Diagnoses  Name Primary?   Spinal stenosis of lumbar region with neurogenic claudication Yes   Status post lumbar spine surgery for decompression of spinal cord    Deep vein thrombosis (DVT) of right lower extremity, unspecified chronicity, unspecified vein (HCC)    Primary osteoarthritis of left knee    Acute pain of right hip       DOI/DOS/ Date: 10/03/23 slipped and having back and knee spasms and the cold weather makes it worse   Brandon Burton is here for follow-up regarding FMLA update which she gets every 6 months  He also had a setback where he fell injured himself injured his left knee back and right hip including some groin pain  He is nearly back to normal but thought he should get his knee checked out in his hip  Cold weather is bothering his knee as well.  His exam shows that he has regained full range of motion of his left knee;  We also note that his right hip exam was normal  H/o Back surgery: Preop diagnosis: Severe two-level lumbar spinal stenosis with neurogenic claudication L3-4, L4-5 with L3-4 disc extrusion.  Postop diagnosis: Same.  Procedure: L3-4, L4-5 decompression. Near complete laminectomy L3, completed L4, complete laminectomy L5. Repair of 3 mm Intra-Op durotomy with primary repair sutures. Right L3-4 microdiscectomy removal of extruded fragment.  Encounter Diagnoses  Name Primary?   Spinal stenosis of lumbar region with neurogenic claudication Yes   Status post lumbar spine surgery for decompression of spinal cord    Deep vein thrombosis (DVT) of right lower extremity, unspecified chronicity, unspecified vein (HCC)    Primary osteoarthritis of left knee    Acute pain of right hip     He will need his FMLA restrictions extended and we will see him back  in 6 months his other injuries right hip left knee have improved from an acute symptom standpoint still has his chronic knee pain arthritis and now he has ongoing intermittent back discomfort

## 2023-10-25 ENCOUNTER — Ambulatory Visit: Payer: BC Managed Care – PPO | Attending: Internal Medicine | Admitting: *Deleted

## 2023-10-25 DIAGNOSIS — I82401 Acute embolism and thrombosis of unspecified deep veins of right lower extremity: Secondary | ICD-10-CM

## 2023-10-25 DIAGNOSIS — Z7901 Long term (current) use of anticoagulants: Secondary | ICD-10-CM | POA: Diagnosis not present

## 2023-10-25 LAB — POCT INR: INR: 1.7 — AB (ref 2.0–3.0)

## 2023-10-25 NOTE — Patient Instructions (Signed)
Increase warfarin to 1 tablet daily except 1 1/2 tablets on Mondays, Wednesdays and Fridays Recheck in 2 wks

## 2023-11-03 ENCOUNTER — Encounter (INDEPENDENT_AMBULATORY_CARE_PROVIDER_SITE_OTHER): Payer: Self-pay | Admitting: *Deleted

## 2023-11-08 ENCOUNTER — Ambulatory Visit: Payer: BC Managed Care – PPO | Attending: Internal Medicine | Admitting: *Deleted

## 2023-11-08 DIAGNOSIS — I82401 Acute embolism and thrombosis of unspecified deep veins of right lower extremity: Secondary | ICD-10-CM | POA: Diagnosis not present

## 2023-11-08 DIAGNOSIS — Z7901 Long term (current) use of anticoagulants: Secondary | ICD-10-CM

## 2023-11-08 LAB — POCT INR: INR: 3.6 — AB (ref 2.0–3.0)

## 2023-11-08 NOTE — Patient Instructions (Signed)
Hold warfarin tonight then decrease dose to 1 tablet daily  ?Recheck in 2 wks ?

## 2023-11-18 ENCOUNTER — Ambulatory Visit: Payer: BC Managed Care – PPO | Admitting: Orthopaedic Surgery

## 2023-11-22 ENCOUNTER — Ambulatory Visit: Payer: BC Managed Care – PPO

## 2023-12-01 ENCOUNTER — Other Ambulatory Visit (INDEPENDENT_AMBULATORY_CARE_PROVIDER_SITE_OTHER): Payer: Self-pay

## 2023-12-01 ENCOUNTER — Ambulatory Visit (INDEPENDENT_AMBULATORY_CARE_PROVIDER_SITE_OTHER): Payer: BC Managed Care – PPO | Admitting: Orthopaedic Surgery

## 2023-12-01 ENCOUNTER — Ambulatory Visit: Payer: BC Managed Care – PPO | Attending: Internal Medicine | Admitting: *Deleted

## 2023-12-01 DIAGNOSIS — Z7901 Long term (current) use of anticoagulants: Secondary | ICD-10-CM | POA: Diagnosis not present

## 2023-12-01 DIAGNOSIS — M545 Low back pain, unspecified: Secondary | ICD-10-CM

## 2023-12-01 DIAGNOSIS — M51369 Other intervertebral disc degeneration, lumbar region without mention of lumbar back pain or lower extremity pain: Secondary | ICD-10-CM | POA: Insufficient documentation

## 2023-12-01 DIAGNOSIS — Z9889 Other specified postprocedural states: Secondary | ICD-10-CM

## 2023-12-01 DIAGNOSIS — M5136 Other intervertebral disc degeneration, lumbar region with discogenic back pain only: Secondary | ICD-10-CM

## 2023-12-01 DIAGNOSIS — I82401 Acute embolism and thrombosis of unspecified deep veins of right lower extremity: Secondary | ICD-10-CM | POA: Diagnosis not present

## 2023-12-01 DIAGNOSIS — G8929 Other chronic pain: Secondary | ICD-10-CM | POA: Diagnosis not present

## 2023-12-01 LAB — POCT INR: INR: 2.3 (ref 2.0–3.0)

## 2023-12-01 NOTE — Progress Notes (Signed)
 Office Visit Note   Patient: Brandon Burton           Date of Birth: [birth year]           MRN: [id number] Visit Date: 12/01/2023              Requested by: Carylon Perches, MD [id number] [place],  [place] [address] PCP: Carylon Perches, MD   Assessment & Plan: Visit Diagnoses:  1. Chronic bilateral low back pain without sciatica   2. Status post lumbar spine surgery for decompression of spinal cord   3. Degeneration of intervertebral disc of lumbar region with discogenic back pain     Plan: Will send months of physical therapy can follow-up with Dr. Christell Constant in 7 weeks.  He does not respond to therapy outlined plan is repeat MRI scan lumbar spine.  He states at this point he is having great difficulty working.  Will see how he does with some therapy.  Follow-Up Instructions: No follow-ups on file.   Orders:  Orders Placed This Encounter  Procedures   XR Lumbar Spine 2-3 Views   Ambulatory referral to Physical Therapy   Ambulatory referral to Orthopedic Surgery   No orders of the defined types were placed in this encounter.     Procedures: No procedures performed   Clinical Data: No additional findings.   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  Previous lumbar decompression L3-4 L4-5 11/26/2021 with 2-week postop reexploration for dural leak which did well.  He has had some problems with knee pain has some joint space narrowing without significant spurring in his knees.  Pain in the shoulder past history of injury to bicep tendon on the right 10+ years ago and some rotator cuff problems since then.  He is seeing Dr. Weyman Croon here send for his shoulder.  Patient  states he is at the point where he feels like he is not able to continue with work activities.  Post lumbar surgery had problems with DVT.  No history of PE.  Back bothers him every day when he comes home he has problems with turning twisting repetitive bending.  Some knee pain some shoulder pain.  He states at home on the weekends he mostly has to just rest.  Review of Systems positive for GERD history of DVT lumbar decompression surgery.  Previous history close calcaneus fracture.   Objective: Vital Signs: There were no vitals taken for this visit.  Physical Exam Constitutional:      Appearance: He is well-developed.  HENT:     Head: Normocephalic and atraumatic.     Right Ear: External ear normal.     Left Ear: External ear normal.  Eyes:     Pupils: Pupils are equal, round, and reactive to light.  Neck:     Thyroid: No thyromegaly.     Trachea: No tracheal deviation.  Cardiovascular:     Rate and Rhythm: Normal rate.  Pulmonary:     Effort: Pulmonary effort is normal.     Breath sounds: No wheezing.  Abdominal:     General: Bowel sounds are normal.     Palpations:  Abdomen is soft.  Musculoskeletal:     Cervical back: Neck supple.  Skin:    General: Skin is warm and dry.     Capillary Refill: Capillary refill takes less than 2 seconds.  Neurological:     Mental Status: He is alert and oriented to person, place, and time.  Psychiatric:        Behavior: Behavior normal.        Thought Content: Thought content normal.        Judgment: Judgment normal.     Ortho Exam on patient is amatory no foot drop.  Slow from getting sitting standing.  Well-healed lumbar incision.  Specialty Comments:  No specialty comments available.  Imaging: XR Lumbar Spine 2-3 Views Result Date: 12/01/2023 AP lateral lumbar images are obtained and reviewed this shows lumbar decompression L3-4 L4-5.  There is to space narrowing at L3-4 L4-5 and left at L5-S1.  Straightening of the lumbar spine  no anterolisthesis.  Laminectomy defect noted.  Hips and sacroiliac joints appear normal. Impression post decompression L3-4 L4-5.  Disc base narrowing at those levels has progressed from 06/16/2021 MRI scan.    PMFS History: Patient Active Problem List   Diagnosis Date Noted   Disc degeneration, lumbar 12/01/2023   Osteoarthritis 10/08/2023   Acute venous embolism and thrombosis of deep vessels of proximal lower extremity (HCC) 02/15/2023   Long term (current) use of anticoagulants 01/27/2023   HTN (hypertension) 01/27/2023   Surgical site infection 12/23/2021   Medication monitoring encounter 12/23/2021   DVT (deep venous thrombosis) (HCC) 12/17/2021   Wound drainage 12/10/2021   Status post lumbar spine surgery for decompression of spinal cord 12/09/2021   Postoperative CSF leak 12/09/2021   Lumbar stenosis 11/26/2021   Spinal stenosis of lumbar region 07/04/2021   History of colonic polyps 08/16/2018   GERD (gastroesophageal reflux disease) 07/08/2015   Rectal bleeding 07/08/2015   Sprain of ankle 09/10/2009   CLOSED FRACTURE OF CALCANEUS 08/27/2009   SHOULDER STRAIN 02/27/2009   Past Medical History:  Diagnosis Date   DVT (deep venous thrombosis) (HCC)    GERD (gastroesophageal reflux disease)    Hypertension     Family History  Problem Relation Age of Onset   Thyroid cancer Mother    Heart attack Father    Colon cancer Neg Hx    Colon polyps Neg Hx     Past Surgical History:  Procedure Laterality Date   COLONOSCOPY N/A 07/22/2015   SLF:7 polyps removed/mild diverticulosis/moderate internal hemorrhoids   COLONOSCOPY N/A 11/18/2018   Diverticulosis, hemorrhoids, otherwise normal. 2025 due.   EYE SURGERY     LUMBAR LAMINECTOMY N/A 12/10/2021   Procedure: LUMBAR RE-EXPLORATION;  Surgeon: Eldred Manges, MD;  [facility];  Service: Orthopedics;  Laterality: N/A;   LUMBAR LAMINECTOMY/DECOMPRESSION MICRODISCECTOMY N/A 11/26/2021   Procedure: Lumbar three-four,  Lumbar four-five Central Decompression, Lumbar Three-Four Microdiscectomhy;  Surgeon: Eldred Manges, MD;  Location: [facility] OR;  Service: Orthopedics;  Laterality: N/A;   SHOULDER SURGERY Left 09/28/2013   fix previous clavicular fracture complication   Social History   Occupational History   Not on file  Tobacco Use   Smoking status: Light Smoker    Types: Cigars   Smokeless tobacco: Never   Tobacco comments:    weekend / social  Vaping Use   Vaping status: Never Used  Substance and Sexual Activity   Alcohol use: Not Currently    Comment: social, every 1-2 months approx 1 drink per occasion  Drug use: Yes    Types: Marijuana    Comment: sometimes for pain   Sexual activity: Not on file

## 2023-12-01 NOTE — Patient Instructions (Signed)
Continue warfarin 1 tablet daily Recheck in 4 wks 

## 2023-12-09 ENCOUNTER — Ambulatory Visit: Payer: BC Managed Care – PPO | Admitting: Internal Medicine

## 2023-12-20 DIAGNOSIS — I82491 Acute embolism and thrombosis of other specified deep vein of right lower extremity: Secondary | ICD-10-CM | POA: Diagnosis not present

## 2023-12-20 DIAGNOSIS — I10 Essential (primary) hypertension: Secondary | ICD-10-CM | POA: Diagnosis not present

## 2023-12-20 DIAGNOSIS — E785 Hyperlipidemia, unspecified: Secondary | ICD-10-CM | POA: Diagnosis not present

## 2023-12-20 DIAGNOSIS — Z79899 Other long term (current) drug therapy: Secondary | ICD-10-CM | POA: Diagnosis not present

## 2023-12-20 DIAGNOSIS — Z125 Encounter for screening for malignant neoplasm of prostate: Secondary | ICD-10-CM | POA: Diagnosis not present

## 2023-12-29 ENCOUNTER — Encounter (HOSPITAL_COMMUNITY): Payer: Self-pay

## 2023-12-29 ENCOUNTER — Other Ambulatory Visit: Payer: Self-pay

## 2023-12-29 ENCOUNTER — Ambulatory Visit (HOSPITAL_COMMUNITY): Attending: Internal Medicine

## 2023-12-29 DIAGNOSIS — G8929 Other chronic pain: Secondary | ICD-10-CM | POA: Insufficient documentation

## 2023-12-29 DIAGNOSIS — M545 Low back pain, unspecified: Secondary | ICD-10-CM

## 2023-12-29 DIAGNOSIS — R262 Difficulty in walking, not elsewhere classified: Secondary | ICD-10-CM

## 2023-12-29 DIAGNOSIS — M6281 Muscle weakness (generalized): Secondary | ICD-10-CM | POA: Diagnosis not present

## 2023-12-29 NOTE — Therapy (Signed)
 OUTPATIENT PHYSICAL THERAPY THORACOLUMBAR EVALUATION   Patient Name: Brandon Burton MRN: [id number] DOB:[birth year], 58 y.o., male Today's Date: 12/29/2023  END OF SESSION:  PT End of Session - 12/29/23 1535     Visit Number 1    Date for PT Re-Evaluation 02/09/24    Authorization Type [employer] COMM PPO    Authorization Time Period auth requested    Progress Note Due on Visit 10    PT Start Time 1433    PT Stop Time 1513    PT Time Calculation (min) 40 min    Activity Tolerance Patient tolerated treatment well;Patient limited by fatigue;Patient limited by pain    Behavior During Therapy WFL for tasks assessed/performed             Past Medical History:  Diagnosis Date   DVT (deep venous thrombosis) (HCC)    GERD (gastroesophageal reflux disease)    Hypertension    Past Surgical History:  Procedure Laterality Date   COLONOSCOPY N/A 07/22/2015   SLF:7 polyps removed/mild diverticulosis/moderate internal hemorrhoids   COLONOSCOPY N/A 11/18/2018   Diverticulosis, hemorrhoids, otherwise normal. 2025 due.   EYE SURGERY     LUMBAR LAMINECTOMY N/A 12/10/2021   Procedure: LUMBAR RE-EXPLORATION;  Surgeon: Eldred Manges, MD;  Location: [facility] OR;  Service: Orthopedics;  Laterality: N/A;   LUMBAR LAMINECTOMY/DECOMPRESSION MICRODISCECTOMY N/A 11/26/2021   Procedure: Lumbar three-four, Lumbar four-five Central Decompression, Lumbar Three-Four Microdiscectomhy;  Surgeon: Eldred Manges, MD;  Location: [facility] OR;  Service: Orthopedics;  Laterality: N/A;   SHOULDER SURGERY Left 09/28/2013   fix previous clavicular fracture complication   Patient Active Problem List   Diagnosis Date Noted   Disc degeneration, lumbar 12/01/2023   Osteoarthritis 10/08/2023   Acute venous embolism and thrombosis of deep vessels of proximal lower extremity (HCC) 02/15/2023   Long term (current) use of anticoagulants 01/27/2023   HTN (hypertension) 01/27/2023   Surgical site infection 12/23/2021   Medication  monitoring encounter 12/23/2021   DVT (deep venous thrombosis) (HCC) 12/17/2021   Wound drainage 12/10/2021   Status post lumbar spine surgery for decompression of spinal cord 12/09/2021   Postoperative CSF leak 12/09/2021   Lumbar stenosis 11/26/2021   Spinal stenosis of lumbar region 07/04/2021   History of colonic polyps 08/16/2018   GERD (gastroesophageal reflux disease) 07/08/2015   Rectal bleeding 07/08/2015   Sprain of ankle 09/10/2009   CLOSED FRACTURE OF CALCANEUS 08/27/2009   SHOULDER STRAIN 02/27/2009    PCP: Carylon Perches, MD   REFERRING PROVIDER: Eldred Manges, MD  REFERRING DIAG: M54.50,G89.29 (ICD-10-CM) - Chronic bilateral low back pain without sciatica  Rationale for Evaluation and Treatment: Rehabilitation  THERAPY DIAG:  Low back pain, unspecified back pain laterality, unspecified chronicity, unspecified whether sciatica present  Muscle weakness (generalized)  Difficulty in walking, not elsewhere classified  ONSET DATE: 03/23 pt reports one major back surgery and then revision 2 weeks later  SUBJECTIVE:  SUBJECTIVE STATEMENT: Pt reports having a couple of back surgeries a couple of years ago, one major decompression surgery and the other a revision. Pt reports being out of work since March the 15th and his retirement date was yesterday. 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 Pt also reports being so exhausted from work that he did not have any energy to do anything after work. Pt reports pain rating of 6/10 after walking for extended distances but tries to walk a mile everyday. He reports still being numb in R butt cheek. Can not stay in one position for longer than an hour and reports being very stiff in the morning time. Pt reports  wanting to get RLE stronger as it is visibly smaller and wants to be able to get his stamina back to be able to walk with his wife.  PERTINENT HISTORY:  -2 back surgery's -shoulder surgery left, 2014 -out of work from March 15th, retirement date 12/28/23  PAIN:  Are you having pain? Yes: NPRS scale: 6/10 Pain location: right hip, back, right side Pain description: ache, sudden move brings on sharp pain, dull Aggravating factors: worse in the morning, prolonged sitting standing, walking, constant bending, stooping, twisting, jarring in the car Relieving factors: get up and get going, 2 tylenol   PRECAUTIONS: None  RED FLAGS: Bowel or bladder incontinence: Yes: decreased stream   Slow gain in weight, accounts it to inability  WEIGHT BEARING RESTRICTIONS: No  FALLS:  Has patient fallen in last 6 months? No   OCCUPATION: janitor  PLOF: Independent and Needs assistance with ADLs, wife to help with putting belt on and putting socks and shoes on   PATIENT GOALS: like to be able to get stronger in legs and hips, get back to jogging, better endurance  NEXT MD VISIT: after therapy  OBJECTIVE:  Note: Objective measures were completed at Evaluation unless otherwise noted.  DIAGNOSTIC FINDINGS:  AP lateral lumbar images are obtained and reviewed this shows lumbar  decompression L3-4 L4-5.  There is to space narrowing at L3-4 L4-5 and  left at L5-S1.  Straightening of the lumbar spine no anterolisthesis.   Laminectomy defect noted.  Hips and sacroiliac joints appear normal.   Impression post decompression L3-4 L4-5.  Disc base narrowing at those  levels has progressed from 06/16/2021 MRI scan.   PATIENT SURVEYS:  Modified Oswestry 20/50   COGNITION: Overall cognitive status: Within functional limits for tasks assessed     SENSATION: Light touch: Impaired  Right LE decreased sensation compared to left, thigh to foot   POSTURE: rounded shoulders and decreased lumbar  lordosis  PALPATION: Popping with lumbar segement  LUMBAR ROM:   AROM eval  Flexion 75 degrees, pain  Extension 10 pain  Right lateral flexion 15 degrees, pain at end ROM  Left lateral flexion 10 degrees, fingers almost to knee  Right rotation 50%  Left rotation 75%   (Blank rows = not tested)  LOWER EXTREMITY ROM:     Active  Right eval Left eval  Hip flexion    Hip extension [facility]  Hip abduction [facility]  Hip adduction    Hip internal rotation    Hip external rotation    Knee flexion [facility]  Knee extension [facility]  Ankle dorsiflexion    Ankle plantarflexion    Ankle inversion    Ankle eversion     (Blank rows = not tested)  LOWER EXTREMITY MMT:    MMT Right eval Left eval  Hip flexion 4- 4  Hip extension 4, pain in back 4+  Hip abduction 4- 4  Hip adduction 4 4  Hip internal rotation    Hip external rotation    Knee flexion 3 4  Knee extension    Ankle dorsiflexion    Ankle plantarflexion    Ankle inversion    Ankle eversion     (Blank rows = not tested)  LUMBAR SPECIAL TESTS:  Straight leg raise test: TBA next session and Slump test: TBA next session  FUNCTIONAL TESTS:  5 times sit to stand: 16.8 2 minute walk test: 292 feet  GAIT: Distance walked: 292 Assistive device utilized: None Level of assistance: Complete Independence Comments: some back pain, decreased speed, decreased stride length  TREATMENT DATE:  12/29/2023   Evaluation: -ROM measured, Strength assessed, HEP prescribed, pt educated on prognosis, findings, and importance of HEP compliance if given.                                                                                                                                   PATIENT EDUCATION:  Education details: Pt was educated on findings of PT evaluation, prognosis, frequency of therapy visits and rationale, attendance policy, and HEP if given.   Person educated: Patient Education method: Explanation and Verbal  cues Education comprehension: verbalized understanding and needs further education  HOME EXERCISE PROGRAM: Squats to chair, 3 sets of 10 daily Walk 15-30 minutes per day 5/7 days per week  ASSESSMENT:  CLINICAL IMPRESSION: Patient is a 58 y.o. male who was seen today for physical therapy evaluation and treatment for M54.50,G89.29 (ICD-10-CM) - Chronic bilateral low back pain without sciatica.   Patient demonstrates decreased LE strength (RLE weaker than left), abnormal pain rating, and impaired endurance with ambulation. Patient also demonstrates difficulty with ambulation during today's session with decreased stride length and velocity noted. Patient also demonstrates tenderness to mobilization of lumbar spine segments L1-L5, with pain originating from there during hip extension strength testing as well. Patient would benefit from skilled physical therapy for increased endurance with ambulation, increased RLE strength, and endurance for improved gait quality, return to higher level of function with ADLs, and progress towards therapy goals.   OBJECTIVE IMPAIRMENTS: Abnormal gait, decreased activity tolerance, decreased endurance, decreased mobility, difficulty walking, decreased ROM, decreased strength, impaired sensation, and pain.   ACTIVITY LIMITATIONS: carrying, lifting, bending, sitting, squatting, stairs, bed mobility, and locomotion level  PARTICIPATION LIMITATIONS: meal prep, cleaning, laundry, shopping, community activity, occupation, and yard work  PERSONAL FACTORS: Fitness, Past/current experiences, and Time since onset of injury/illness/exacerbation are also affecting patient's functional outcome.   REHAB POTENTIAL: Good  CLINICAL DECISION MAKING: Stable/uncomplicated  EVALUATION COMPLEXITY: Low   GOALS: Goals reviewed with patient? No  SHORT TERM GOALS: Target date: 01/19/24  Pt will be independent with HEP in order to demonstrate participation in Physical  Therapy POC.   Baseline: Goal status: INITIAL  2.  Pt will report 4/10 pain at worst with mobility in order to demonstrate improved pain with ADLs.  Baseline: 6/10 Goal status: INITIAL  LONG TERM GOALS: Target date: 02/09/24  Pt will improve 5TSTS by at least 2.3 seconds in order to demonstrate improved functional strength to return to desired activities.  Baseline: see objective.  Goal status: INITIAL  2.  Pt will improve 2 MWT by 140 feet in order to demonstrate improved functional ambulatory capacity in community setting.  Baseline: see objective.  Goal status: INITIAL  3.  Pt will improve Modified Oswestry score by at least 6 points in order to demonstrate improved pain with functional goals and outcomes. Baseline: see objective.  Goal status: INITIAL  4.  Pt will report 2/10 pain with mobility in order to demonstrate reduced pain with ADLs lasting greater than 30 minutes.  Baseline: see objective.  Goal status: INITIAL   PLAN:  PT FREQUENCY: 2x/week  PT DURATION: 6 weeks  PLANNED INTERVENTIONS: 97110-Therapeutic exercises, 97530- Therapeutic activity, [id number]- Neuromuscular re-education, 97535- Self Care, [id number]- Manual therapy, [id number]- Gait training, Spinal mobilization, Cryotherapy, and Moist heat.  PLAN FOR NEXT SESSION: Complete LE strength testing, LE strengthening with focus on RLE, core strengthening, increased endurance and decreased pain with walking, provide progressed HEP with handout.   Luz Lex, PT, DPT [facility] Office: [phone] 3:45 PM, 12/29/23   Managed Medicaid Authorization Request  Visit Dx Codes: M54.50, G89.29 , M62.81 , R26.2   Functional Tool Score: Modified Oswestry 20/50  For all possible CPT codes, reference the Planned Interventions line above.     Check all conditions that are expected to impact treatment: {Conditions expected to impact treatment:None of these apply   If treatment provided at initial evaluation, no  treatment charged due to lack of authorization.

## 2024-01-03 ENCOUNTER — Encounter

## 2024-01-03 ENCOUNTER — Ambulatory Visit

## 2024-01-04 ENCOUNTER — Ambulatory Visit (HOSPITAL_COMMUNITY)

## 2024-01-04 ENCOUNTER — Other Ambulatory Visit: Payer: Self-pay | Admitting: Internal Medicine

## 2024-01-04 ENCOUNTER — Encounter (HOSPITAL_COMMUNITY): Payer: Self-pay

## 2024-01-04 DIAGNOSIS — M545 Low back pain, unspecified: Secondary | ICD-10-CM | POA: Diagnosis not present

## 2024-01-04 DIAGNOSIS — G8929 Other chronic pain: Secondary | ICD-10-CM | POA: Diagnosis not present

## 2024-01-04 DIAGNOSIS — R262 Difficulty in walking, not elsewhere classified: Secondary | ICD-10-CM | POA: Diagnosis not present

## 2024-01-04 DIAGNOSIS — M6281 Muscle weakness (generalized): Secondary | ICD-10-CM | POA: Diagnosis not present

## 2024-01-04 NOTE — Addendum Note (Signed)
 Addended by: Luz Lex on: 01/04/2024 12:38 PM   Modules accepted: Orders

## 2024-01-04 NOTE — Therapy (Signed)
 OUTPATIENT PHYSICAL THERAPY THORACOLUMBAR TREATMENT   Patient Name: Brandon Burton MRN: [id number] DOB:Jul 05, 1966, 58 y.o., male Today's Date: 01/04/2024  END OF SESSION:  PT End of Session - 01/04/24 1310     Visit Number 2    Number of Visits 12    Date for PT Re-Evaluation 02/09/24    Authorization Type [employer] COMM PPO    Progress Note Due on Visit 10    PT Start Time 1309   Late sign in   PT Stop Time 1343    PT Time Calculation (min) 34 min    Activity Tolerance Patient tolerated treatment well;Patient limited by fatigue;Patient limited by pain    Behavior During Therapy WFL for tasks assessed/performed              Past Medical History:  Diagnosis Date   DVT (deep venous thrombosis) (HCC)    GERD (gastroesophageal reflux disease)    Hypertension    Past Surgical History:  Procedure Laterality Date   COLONOSCOPY N/A 07/22/2015   SLF:7 polyps removed/mild diverticulosis/moderate internal hemorrhoids   COLONOSCOPY N/A 11/18/2018   Diverticulosis, hemorrhoids, otherwise normal. 2025 due.   EYE SURGERY     LUMBAR LAMINECTOMY N/A 12/10/2021   Procedure: LUMBAR RE-EXPLORATION;  Surgeon: Eldred Manges, MD;  Location: [facility] OR;  Service: Orthopedics;  Laterality: N/A;   LUMBAR LAMINECTOMY/DECOMPRESSION MICRODISCECTOMY N/A 11/26/2021   Procedure: Lumbar three-four, Lumbar four-five Central Decompression, Lumbar Three-Four Microdiscectomhy;  Surgeon: Eldred Manges, MD;  Location: [facility] OR;  Service: Orthopedics;  Laterality: N/A;   SHOULDER SURGERY Left 09/28/2013   fix previous clavicular fracture complication   Patient Active Problem List   Diagnosis Date Noted   Disc degeneration, lumbar 12/01/2023   Osteoarthritis 10/08/2023   Acute venous embolism and thrombosis of deep vessels of proximal lower extremity (HCC) 02/15/2023   Long term (current) use of anticoagulants 01/27/2023   HTN (hypertension) 01/27/2023   Surgical site infection 12/23/2021   Medication monitoring  encounter 12/23/2021   DVT (deep venous thrombosis) (HCC) 12/17/2021   Wound drainage 12/10/2021   Status post lumbar spine surgery for decompression of spinal cord 12/09/2021   Postoperative CSF leak 12/09/2021   Lumbar stenosis 11/26/2021   Spinal stenosis of lumbar region 07/04/2021   History of colonic polyps 08/16/2018   GERD (gastroesophageal reflux disease) 07/08/2015   Rectal bleeding 07/08/2015   Sprain of ankle 09/10/2009   CLOSED FRACTURE OF CALCANEUS 08/27/2009   SHOULDER STRAIN 02/27/2009    PCP: Carylon Perches, MD   REFERRING PROVIDER: Eldred Manges, MD  REFERRING DIAG: M54.50,G89.29 (ICD-10-CM) - Chronic bilateral low back pain without sciatica  Rationale for Evaluation and Treatment: Rehabilitation  THERAPY DIAG:  Low back pain, unspecified back pain laterality, unspecified chronicity, unspecified whether sciatica present  Muscle weakness (generalized)  Difficulty in walking, not elsewhere classified  ONSET DATE: 03/23 pt reports one major back surgery and then revision 2 weeks later  SUBJECTIVE:  SUBJECTIVE STATEMENT: 01/04/24:  Reports he feels he overdid it on Sunday.  Has been compliant daily and walked a mile with faster pace, stated wife didn't have to slow down.  Reports increased stiffness, LBP with LE pain scale 6-7/10.  Eval:  Pt reports having a couple of back surgeries a couple of years ago, one major decompression surgery and the other a revision. Pt reports being out of work since March the 15th and his retirement date was yesterday. 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 Pt also reports being so exhausted from work that he did not have any energy to do anything after work. Pt reports pain rating of 6/10 after walking for  extended distances but tries to walk a mile everyday. He reports still being numb in R butt cheek. Can not stay in one position for longer than an hour and reports being very stiff in the morning time. Pt reports wanting to get RLE stronger as it is visibly smaller and wants to be able to get his stamina back to be able to walk with his wife.  PERTINENT HISTORY:  -2 back surgery's -shoulder surgery left, 2014 -out of work from March 15th, retirement date 12/28/23  PAIN:  Are you having pain? Yes: NPRS scale: 6/10 Pain location: right hip, back, right side Pain description: ache, sudden move brings on sharp pain, dull Aggravating factors: worse in the morning, prolonged sitting standing, walking, constant bending, stooping, twisting, jarring in the car Relieving factors: get up and get going, 2 tylenol   PRECAUTIONS: None  RED FLAGS: Bowel or bladder incontinence: Yes: decreased stream   Slow gain in weight, accounts it to inability  WEIGHT BEARING RESTRICTIONS: No  FALLS:  Has patient fallen in last 6 months? No   OCCUPATION: janitor  PLOF: Independent and Needs assistance with ADLs, wife to help with putting belt on and putting socks and shoes on   PATIENT GOALS: like to be able to get stronger in legs and hips, get back to jogging, better endurance  NEXT MD VISIT: after therapy  OBJECTIVE:  Note: Objective measures were completed at Evaluation unless otherwise noted.  DIAGNOSTIC FINDINGS:  AP lateral lumbar images are obtained and reviewed this shows lumbar  decompression L3-4 L4-5.  There is to space narrowing at L3-4 L4-5 and  left at L5-S1.  Straightening of the lumbar spine no anterolisthesis.   Laminectomy defect noted.  Hips and sacroiliac joints appear normal.   Impression post decompression L3-4 L4-5.  Disc base narrowing at those  levels has progressed from 06/16/2021 MRI scan.   PATIENT SURVEYS:  Modified Oswestry 20/50   COGNITION: Overall cognitive status:  Within functional limits for tasks assessed     SENSATION: Light touch: Impaired  Right LE decreased sensation compared to left, thigh to foot   POSTURE: rounded shoulders and decreased lumbar lordosis  PALPATION: Popping with lumbar segement  LUMBAR ROM:   AROM eval  Flexion 75 degrees, pain  Extension 10 pain  Right lateral flexion 15 degrees, pain at end ROM  Left lateral flexion 10 degrees, fingers almost to knee  Right rotation 50%  Left rotation 75%   (Blank rows = not tested)  LOWER EXTREMITY ROM:     Active  Right eval Left eval Right 01/04/24 01/04/24  Hip flexion      Hip extension [facility]    Hip abduction [facility] WFL    Hip adduction  Hip internal rotation      Hip external rotation      Knee flexion [facility]    Knee extension [facility]    Ankle dorsiflexion   5 degree 3  Ankle plantarflexion      Ankle inversion      Ankle eversion       (Blank rows = not tested)  LOWER EXTREMITY MMT:    MMT Right eval Left eval Right 01/04/24 Left 01/04/24  Hip flexion 4- 4    Hip extension 4, pain in back 4+    Hip abduction 4- 4    Hip adduction 4 4    Hip internal rotation      Hip external rotation      Knee flexion 3 4    Knee extension   4+ 4+  Ankle dorsiflexion   2+ 2+  Ankle plantarflexion      Ankle inversion      Ankle eversion       (Blank rows = not tested)  LUMBAR SPECIAL TESTS:  Straight leg raise test: Positive Left 55 degrees;  Negative left 60 degrees and Slump test: Negative  FUNCTIONAL TESTS:  5 times sit to stand: 16.8 2 minute walk test: 292 feet  GAIT: Distance walked: 292 Assistive device utilized: None Level of assistance: Complete Independence Comments: some back pain, decreased speed, decreased stride length  TREATMENT DATE:  01/04/24: Reviewed goals MMT ROM measurement Straight leg test   Squat front of chair  March alternating 10x 3" Supine: Instructed log rolling LTR 5x 10" SKTC with towel 2x 30" Hamstring stretch 3x  30"  12/29/2023   Evaluation: -ROM measured, Strength assessed, HEP prescribed, pt educated on prognosis, findings, and importance of HEP compliance if given.                                                                                                                                   PATIENT EDUCATION:  Education details: Pt was educated on findings of PT evaluation, prognosis, frequency of therapy visits and rationale, attendance policy, and HEP if given.   Person educated: Patient Education method: Explanation and Verbal cues Education comprehension: verbalized understanding and needs further education  HOME EXERCISE PROGRAM: Squats to chair, 3 sets of 10 daily Walk 15-30 minutes per day 5/7 days per week  Access Code: [email or web address] URL: [email or web address] Date: 01/04/2024 Prepared by: Becky Sax  Exercises - Squat with Chair Touch  - 2 x daily - 7 x weekly - 2 sets - 10 reps - 3" hold - Hooklying Single Knee to Chest Stretch with Towel  - 2 x daily - 7 x weekly - 1 sets - 3 reps - 30 hold - Supine Hamstring Stretch with Strap  - 2 x daily - 7 x weekly - 1 sets - 3 reps - 30" hold  ASSESSMENT:  CLINICAL IMPRESSION: Reviewed goals and educated importance of HEP complinace for  maximal benefits.  Further testing reveal limited dorsiflexion and c/o tightess on back and hamstring.  SLR and slump test complete with reports of hamstrings burning that was resolved following position.  Reviewed mechanics with current HEP to assure correct form and mechanics.  Added stretches to POC as pt does present with limited range due to tightness in hamstrings, gluteal and lower back, reports of relief following stretches, added to HEP with printout given.  Patient is a 58 y.o. male who was seen today for physical therapy evaluation and treatment for M54.50,G89.29 (ICD-10-CM) - Chronic bilateral low back pain without sciatica.   Patient demonstrates decreased LE strength (RLE  weaker than left), abnormal pain rating, and impaired endurance with ambulation. Patient also demonstrates difficulty with ambulation during today's session with decreased stride length and velocity noted. Patient also demonstrates tenderness to mobilization of lumbar spine segments L1-L5, with pain originating from there during hip extension strength testing as well. Patient would benefit from skilled physical therapy for increased endurance with ambulation, increased RLE strength, and endurance for improved gait quality, return to higher level of function with ADLs, and progress towards therapy goals.   OBJECTIVE IMPAIRMENTS: Abnormal gait, decreased activity tolerance, decreased endurance, decreased mobility, difficulty walking, decreased ROM, decreased strength, impaired sensation, and pain.   ACTIVITY LIMITATIONS: carrying, lifting, bending, sitting, squatting, stairs, bed mobility, and locomotion level  PARTICIPATION LIMITATIONS: meal prep, cleaning, laundry, shopping, community activity, occupation, and yard work  PERSONAL FACTORS: Fitness, Past/current experiences, and Time since onset of injury/illness/exacerbation are also affecting patient's functional outcome.   REHAB POTENTIAL: Good  CLINICAL DECISION MAKING: Stable/uncomplicated  EVALUATION COMPLEXITY: Low   GOALS: Goals reviewed with patient? No  SHORT TERM GOALS: Target date: 01/19/24  Pt will be independent with HEP in order to demonstrate participation in Physical Therapy POC.  Baseline: Goal status: INITIAL  2.  Pt will report 4/10 pain at worst with mobility in order to demonstrate improved pain with ADLs.  Baseline: 6/10 Goal status: INITIAL  LONG TERM GOALS: Target date: 02/09/24  Pt will improve 5TSTS by at least 2.3 seconds in order to demonstrate improved functional strength to return to desired activities.  Baseline: see objective.  Goal status: INITIAL  2.  Pt will improve 2 MWT by 140 feet in order to  demonstrate improved functional ambulatory capacity in community setting.  Baseline: see objective.  Goal status: INITIAL  3.  Pt will improve Modified Oswestry score by at least 6 points in order to demonstrate improved pain with functional goals and outcomes. Baseline: see objective.  Goal status: INITIAL  4.  Pt will report 2/10 pain with mobility in order to demonstrate reduced pain with ADLs lasting greater than 30 minutes.  Baseline: see objective.  Goal status: INITIAL   PLAN:  PT FREQUENCY: 2x/week  PT DURATION: 6 weeks  PLANNED INTERVENTIONS: 97110-Therapeutic exercises, 97530- Therapeutic activity, [id number]- Neuromuscular re-education, 97535- Self Care, [id number]- Manual therapy, [id number]- Gait training, Spinal mobilization, Cryotherapy, and Moist heat.  PLAN FOR NEXT SESSION: LE strengthening with focus on RLE, core strengthening, increased endurance and decreased pain with walking, provide progressed HEP with handout.   Becky Sax, LPTA/CLT; [place] [phone]  2:57 PM, 01/04/24

## 2024-01-05 ENCOUNTER — Ambulatory Visit: Attending: Internal Medicine | Admitting: *Deleted

## 2024-01-05 DIAGNOSIS — I82401 Acute embolism and thrombosis of unspecified deep veins of right lower extremity: Secondary | ICD-10-CM

## 2024-01-05 DIAGNOSIS — Z7901 Long term (current) use of anticoagulants: Secondary | ICD-10-CM | POA: Diagnosis not present

## 2024-01-05 LAB — POCT INR: INR: 1.7 — AB (ref 2.0–3.0)

## 2024-01-05 NOTE — Patient Instructions (Signed)
Take warfarin 1 1/2 tablets tonight and tomorrow night then resume 1 tablet daily   Recheck in 2 wks

## 2024-01-10 DIAGNOSIS — I82409 Acute embolism and thrombosis of unspecified deep veins of unspecified lower extremity: Secondary | ICD-10-CM | POA: Diagnosis not present

## 2024-01-10 DIAGNOSIS — E785 Hyperlipidemia, unspecified: Secondary | ICD-10-CM | POA: Diagnosis not present

## 2024-01-10 DIAGNOSIS — I10 Essential (primary) hypertension: Secondary | ICD-10-CM | POA: Diagnosis not present

## 2024-01-13 ENCOUNTER — Ambulatory Visit (HOSPITAL_COMMUNITY)

## 2024-01-13 ENCOUNTER — Encounter (HOSPITAL_COMMUNITY): Payer: Self-pay

## 2024-01-13 DIAGNOSIS — M545 Low back pain, unspecified: Secondary | ICD-10-CM | POA: Diagnosis not present

## 2024-01-13 DIAGNOSIS — G8929 Other chronic pain: Secondary | ICD-10-CM | POA: Diagnosis not present

## 2024-01-13 DIAGNOSIS — M6281 Muscle weakness (generalized): Secondary | ICD-10-CM | POA: Diagnosis not present

## 2024-01-13 DIAGNOSIS — R262 Difficulty in walking, not elsewhere classified: Secondary | ICD-10-CM | POA: Diagnosis not present

## 2024-01-13 NOTE — Therapy (Signed)
 OUTPATIENT PHYSICAL THERAPY THORACOLUMBAR TREATMENT   Patient Name: Brandon Burton MRN: [id number] DOB:08/11/1966, 58 y.o., male Today's Date: 01/13/2024  END OF SESSION:  PT End of Session - 01/13/24 1056     Visit Number 3    Number of Visits 12    Date for PT Re-Evaluation 02/09/24    Authorization Type [employer] COMM PPO    Progress Note Due on Visit 10    PT Start Time 1017    PT Stop Time 1055    PT Time Calculation (min) 38 min    Activity Tolerance Patient tolerated treatment well    Behavior During Therapy WFL for tasks assessed/performed               Past Medical History:  Diagnosis Date   DVT (deep venous thrombosis) (HCC)    GERD (gastroesophageal reflux disease)    Hypertension    Past Surgical History:  Procedure Laterality Date   COLONOSCOPY N/A 07/22/2015   SLF:7 polyps removed/mild diverticulosis/moderate internal hemorrhoids   COLONOSCOPY N/A 11/18/2018   Diverticulosis, hemorrhoids, otherwise normal. 2025 due.   EYE SURGERY     LUMBAR LAMINECTOMY N/A 12/10/2021   Procedure: LUMBAR RE-EXPLORATION;  Surgeon: Adah Acron, MD;  Location: [facility] OR;  Service: Orthopedics;  Laterality: N/A;   LUMBAR LAMINECTOMY/DECOMPRESSION MICRODISCECTOMY N/A 11/26/2021   Procedure: Lumbar three-four, Lumbar four-five Central Decompression, Lumbar Three-Four Microdiscectomhy;  Surgeon: Adah Acron, MD;  Location: [facility] OR;  Service: Orthopedics;  Laterality: N/A;   SHOULDER SURGERY Left 09/28/2013   fix previous clavicular fracture complication   Patient Active Problem List   Diagnosis Date Noted   Disc degeneration, lumbar 12/01/2023   Osteoarthritis 10/08/2023   Acute venous embolism and thrombosis of deep vessels of proximal lower extremity (HCC) 02/15/2023   Long term (current) use of anticoagulants 01/27/2023   HTN (hypertension) 01/27/2023   Surgical site infection 12/23/2021   Medication monitoring encounter 12/23/2021   DVT (deep venous thrombosis) (HCC)  12/17/2021   Wound drainage 12/10/2021   Status post lumbar spine surgery for decompression of spinal cord 12/09/2021   Postoperative CSF leak 12/09/2021   Lumbar stenosis 11/26/2021   Spinal stenosis of lumbar region 07/04/2021   History of colonic polyps 08/16/2018   GERD (gastroesophageal reflux disease) 07/08/2015   Rectal bleeding 07/08/2015   Sprain of ankle 09/10/2009   CLOSED FRACTURE OF CALCANEUS 08/27/2009   SHOULDER STRAIN 02/27/2009    PCP: Artemisa Bile, MD   REFERRING PROVIDER: Adah Acron, MD  REFERRING DIAG: M54.50,G89.29 (ICD-10-CM) - Chronic bilateral low back pain without sciatica  Rationale for Evaluation and Treatment: Rehabilitation  THERAPY DIAG:  Low back pain, unspecified back pain laterality, unspecified chronicity, unspecified whether sciatica present  Muscle weakness (generalized)  Difficulty in walking, not elsewhere classified  ONSET DATE: 03/23 pt reports one major back surgery and then revision 2 weeks later  SUBJECTIVE:  SUBJECTIVE STATEMENT: Pt reports his pain level is 3 /10 currently. The stretches given last session were very helpful. Pt noticing his strides are getting stronger and his wife has been winning as often.   Eval:  Pt reports having a couple of back surgeries a couple of years ago, one major decompression surgery and the other a revision. Pt reports being out of work since March the 15th and his retirement date was yesterday. 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 Pt also reports being so exhausted from work that he did not have any energy to do anything after work. Pt reports pain rating of 6/10 after walking for extended distances but tries to walk a mile everyday. He reports still being numb in R  butt cheek. Can not stay in one position for longer than an hour and reports being very stiff in the morning time. Pt reports wanting to get RLE stronger as it is visibly smaller and wants to be able to get his stamina back to be able to walk with his wife.  PERTINENT HISTORY:  -2 back surgery's -shoulder surgery left, 2014 -out of work from March 15th, retirement date 12/28/23  PAIN:  Are you having pain? Yes: NPRS scale: 6/10 Pain location: right hip, back, right side Pain description: ache, sudden move brings on sharp pain, dull Aggravating factors: worse in the morning, prolonged sitting standing, walking, constant bending, stooping, twisting, jarring in the car Relieving factors: get up and get going, 2 tylenol   PRECAUTIONS: None  RED FLAGS: Bowel or bladder incontinence: Yes: decreased stream   Slow gain in weight, accounts it to inability  WEIGHT BEARING RESTRICTIONS: No  FALLS:  Has patient fallen in last 6 months? No   OCCUPATION: janitor  PLOF: Independent and Needs assistance with ADLs, wife to help with putting belt on and putting socks and shoes on   PATIENT GOALS: like to be able to get stronger in legs and hips, get back to jogging, better endurance  NEXT MD VISIT: after therapy  OBJECTIVE:  Note: Objective measures were completed at Evaluation unless otherwise noted.  DIAGNOSTIC FINDINGS:  AP lateral lumbar images are obtained and reviewed this shows lumbar  decompression L3-4 L4-5.  There is to space narrowing at L3-4 L4-5 and  left at L5-S1.  Straightening of the lumbar spine no anterolisthesis.   Laminectomy defect noted.  Hips and sacroiliac joints appear normal.   Impression post decompression L3-4 L4-5.  Disc base narrowing at those  levels has progressed from 06/16/2021 MRI scan.   PATIENT SURVEYS:  Modified Oswestry 20/50   COGNITION: Overall cognitive status: Within functional limits for tasks assessed     SENSATION: Light touch: Impaired   Right LE decreased sensation compared to left, thigh to foot   POSTURE: rounded shoulders and decreased lumbar lordosis  PALPATION: Popping with lumbar segement  LUMBAR ROM:   AROM eval  Flexion 75 degrees, pain  Extension 10 pain  Right lateral flexion 15 degrees, pain at end ROM  Left lateral flexion 10 degrees, fingers almost to knee  Right rotation 50%  Left rotation 75%   (Blank rows = not tested)  LOWER EXTREMITY ROM:     Active  Right eval Left eval Right 01/04/24 01/04/24  Hip flexion      Hip extension [facility]    Hip abduction [facility]    Hip adduction      Hip  internal rotation      Hip external rotation      Knee flexion [facility]    Knee extension [facility]    Ankle dorsiflexion   5 degree 3  Ankle plantarflexion      Ankle inversion      Ankle eversion       (Blank rows = not tested)  LOWER EXTREMITY MMT:    MMT Right eval Left eval Right 01/04/24 Left 01/04/24  Hip flexion 4- 4    Hip extension 4, pain in back 4+    Hip abduction 4- 4    Hip adduction 4 4    Hip internal rotation      Hip external rotation      Knee flexion 3 4    Knee extension   4+ 4+  Ankle dorsiflexion   2+ 2+  Ankle plantarflexion      Ankle inversion      Ankle eversion       (Blank rows = not tested)  LUMBAR SPECIAL TESTS:  Straight leg raise test: Positive Left 55 degrees;  Negative left 60 degrees and Slump test: Negative  FUNCTIONAL TESTS:  5 times sit to stand: 16.8 2 minute walk test: 292 feet  GAIT: Distance walked: 292 Assistive device utilized: None Level of assistance: Complete Independence Comments: some back pain, decreased speed, decreased stride length  TREATMENT DATE:  01/13/2024  -Recumbent bike x 4.5' -LTR x 5 bilaterally for 20-30'' hold -SKTC with 5x5-10'' hold -Sidelying clamshell with GTB 2 x 15 w/ 3sec isometric-neuro-re-ed for muscle stimulation -Sit/stands with GTB at knees x 12 -Side stepping with GTB at knees 2 x 30'' -Standing against  wall ankle DF x 20- educated on Hep progressing to 25 rep max and walking feet out further  -Tandem balance 2 x 1' bilaterally for Hip extension and balance.   01/04/24: Reviewed goals MMT ROM measurement Straight leg test   Squat front of chair  March alternating 10x 3" Supine: Instructed log rolling LTR 5x 10" SKTC with towel 2x 30" Hamstring stretch 3x 30"  12/29/2023   Evaluation: -ROM measured, Strength assessed, HEP prescribed, pt educated on prognosis, findings, and importance of HEP compliance if given.                                                                                                                                   PATIENT EDUCATION:  Education details: Pt was educated on findings of PT evaluation, prognosis, frequency of therapy visits and rationale, attendance policy, and HEP if given.   Person educated: Patient Education method: Explanation and Verbal cues Education comprehension: verbalized understanding and needs further education  HOME EXERCISE PROGRAM: Squats to chair, 3 sets of 10 daily Walk 15-30 minutes per day 5/7 days per week  Access Code: [email or web address] URL: [email or web address] Date: 01/13/2024 Prepared by: Irene Mannheim  Exercises - Squat with Chair Touch  - 2 x  daily - 7 x weekly - 2 sets - 10 reps - 3" hold - Hooklying Single Knee to Chest Stretch with Towel  - 2 x daily - 7 x weekly - 1 sets - 3 reps - 30 hold - Supine Hamstring Stretch with Strap  - 2 x daily - 7 x weekly - 1 sets - 3 reps - 30" hold - Standing Tandem Balance with Counter Support  - 1 x daily - 7 x weekly - 3 sets - 10 reps - Clamshell with Resistance  - 1 x daily - 7 x weekly - 3 sets - 10 reps - Side Stepping with Resistance at Thighs  - 1 x daily - 7 x weekly - 3 sets - 3-5 reps - 30 hold - Heel Toe Raises with Counter Support  - 1 x daily - 7 x weekly - 3 sets - 10 reps  ASSESSMENT:  CLINICAL IMPRESSION: Pt tolerating therapy session well.  Incorporated neuromuscular re-education of gluteal muscles during functional transfers and balance activities. Pt showing appropriate fatigue and muscle activation with proper form and without increased LB pain. New HEP updated to include balance and RLE strengthening. Pt will benefit from skilled Physical Therapy services to address deficits/limitations in order to improve functional and QOL.    OBJECTIVE IMPAIRMENTS: Abnormal gait, decreased activity tolerance, decreased endurance, decreased mobility, difficulty walking, decreased ROM, decreased strength, impaired sensation, and pain.   ACTIVITY LIMITATIONS: carrying, lifting, bending, sitting, squatting, stairs, bed mobility, and locomotion level  PARTICIPATION LIMITATIONS: meal prep, cleaning, laundry, shopping, community activity, occupation, and yard work  PERSONAL FACTORS: Fitness, Past/current experiences, and Time since onset of injury/illness/exacerbation are also affecting patient's functional outcome.   REHAB POTENTIAL: Good  CLINICAL DECISION MAKING: Stable/uncomplicated  EVALUATION COMPLEXITY: Low   GOALS: Goals reviewed with patient? No  SHORT TERM GOALS: Target date: 01/19/24  Pt will be independent with HEP in order to demonstrate participation in Physical Therapy POC.  Baseline: Goal status: INITIAL  2.  Pt will report 4/10 pain at worst with mobility in order to demonstrate improved pain with ADLs.  Baseline: 6/10 Goal status: INITIAL  LONG TERM GOALS: Target date: 02/09/24  Pt will improve 5TSTS by at least 2.3 seconds in order to demonstrate improved functional strength to return to desired activities.  Baseline: see objective.  Goal status: INITIAL  2.  Pt will improve 2 MWT by 140 feet in order to demonstrate improved functional ambulatory capacity in community setting.  Baseline: see objective.  Goal status: INITIAL  3.  Pt will improve Modified Oswestry score by at least 6 points in order to  demonstrate improved pain with functional goals and outcomes. Baseline: see objective.  Goal status: INITIAL  4.  Pt will report 2/10 pain with mobility in order to demonstrate reduced pain with ADLs lasting greater than 30 minutes.  Baseline: see objective.  Goal status: INITIAL   PLAN:  PT FREQUENCY: 2x/week  PT DURATION: 6 weeks  PLANNED INTERVENTIONS: 97110-Therapeutic exercises, 97530- Therapeutic activity, [id number]- Neuromuscular re-education, 97535- Self Care, [id number]- Manual therapy, [id number]- Gait training, Spinal mobilization, Cryotherapy, and Moist heat.  PLAN FOR NEXT SESSION: LE strengthening with focus on RLE, core strengthening, increased endurance and decreased pain with walking, provide progressed HEP with handout.   Nelida Meuse PT, DPT [facility] [phone] office  10:58 AM, 01/13/24

## 2024-01-19 ENCOUNTER — Ambulatory Visit: Attending: Internal Medicine | Admitting: *Deleted

## 2024-01-19 ENCOUNTER — Ambulatory Visit (HOSPITAL_COMMUNITY): Admitting: Physical Therapy

## 2024-01-19 DIAGNOSIS — Z7901 Long term (current) use of anticoagulants: Secondary | ICD-10-CM

## 2024-01-19 DIAGNOSIS — I82401 Acute embolism and thrombosis of unspecified deep veins of right lower extremity: Secondary | ICD-10-CM | POA: Diagnosis not present

## 2024-01-19 DIAGNOSIS — M6281 Muscle weakness (generalized): Secondary | ICD-10-CM | POA: Diagnosis not present

## 2024-01-19 DIAGNOSIS — M545 Low back pain, unspecified: Secondary | ICD-10-CM

## 2024-01-19 DIAGNOSIS — G8929 Other chronic pain: Secondary | ICD-10-CM | POA: Diagnosis not present

## 2024-01-19 DIAGNOSIS — R262 Difficulty in walking, not elsewhere classified: Secondary | ICD-10-CM | POA: Diagnosis not present

## 2024-01-19 LAB — POCT INR: INR: 2.2 (ref 2.0–3.0)

## 2024-01-19 NOTE — Patient Instructions (Signed)
Continue warfarin 1 tablet daily ?Recheck in 3 wks ?

## 2024-01-19 NOTE — Therapy (Signed)
 OUTPATIENT PHYSICAL THERAPY THORACOLUMBAR TREATMENT   Patient Name: Brandon Burton MRN: [id number] DOB:[birth year], 58 y.o., male Today's Date: 01/19/2024  END OF SESSION:  PT End of Session - 01/19/24 1147     Visit Number 4    Number of Visits 12    Date for PT Re-Evaluation 02/09/24    Authorization Type BCBS COMM PPO    Progress Note Due on Visit 10    PT Start Time 1147    PT Stop Time 1229    PT Time Calculation (min) 42 min    Activity Tolerance Patient tolerated treatment well    Behavior During Therapy WFL for tasks assessed/performed               Past Medical History:  Diagnosis Date   DVT (deep venous thrombosis) (HCC)    GERD (gastroesophageal reflux disease)    Hypertension    Past Surgical History:  Procedure Laterality Date   COLONOSCOPY N/A 07/22/2015   SLF:7 polyps removed/mild diverticulosis/moderate internal hemorrhoids   COLONOSCOPY N/A 11/18/2018   Diverticulosis, hemorrhoids, otherwise normal. 2025 due.   EYE SURGERY     LUMBAR LAMINECTOMY N/A 12/10/2021   Procedure: LUMBAR RE-EXPLORATION;  Surgeon: Adah Acron, MD;  Location: [facility] OR;  Service: Orthopedics;  Laterality: N/A;   LUMBAR LAMINECTOMY/DECOMPRESSION MICRODISCECTOMY N/A 11/26/2021   Procedure: Lumbar three-four, Lumbar four-five Central Decompression, Lumbar Three-Four Microdiscectomhy;  Surgeon: Adah Acron, MD;  Location: [facility] OR;  Service: Orthopedics;  Laterality: N/A;   SHOULDER SURGERY Left 09/28/2013   fix previous clavicular fracture complication   Patient Active Problem List   Diagnosis Date Noted   Disc degeneration, lumbar 12/01/2023   Osteoarthritis 10/08/2023   Acute venous embolism and thrombosis of deep vessels of proximal lower extremity (HCC) 02/15/2023   Long term (current) use of anticoagulants 01/27/2023   HTN (hypertension) 01/27/2023   Surgical site infection 12/23/2021   Medication monitoring encounter 12/23/2021   DVT (deep venous thrombosis) (HCC)  12/17/2021   Wound drainage 12/10/2021   Status post lumbar spine surgery for decompression of spinal cord 12/09/2021   Postoperative CSF leak 12/09/2021   Lumbar stenosis 11/26/2021   Spinal stenosis of lumbar region 07/04/2021   History of colonic polyps 08/16/2018   GERD (gastroesophageal reflux disease) 07/08/2015   Rectal bleeding 07/08/2015   Sprain of ankle 09/10/2009   CLOSED FRACTURE OF CALCANEUS 08/27/2009   SHOULDER STRAIN 02/27/2009    PCP: Artemisa Bile, MD   REFERRING PROVIDER: Adah Acron, MD  REFERRING DIAG: M54.50,G89.29 (ICD-10-CM) - Chronic bilateral low back pain without sciatica  Rationale for Evaluation and Treatment: Rehabilitation  THERAPY DIAG:  Low back pain, unspecified back pain laterality, unspecified chronicity, unspecified whether sciatica present  Muscle weakness (generalized)  Difficulty in walking, not elsewhere classified  ONSET DATE: 03/23 pt reports one major back surgery and then revision 2 weeks later  SUBJECTIVE:  SUBJECTIVE STATEMENT: Pt reports his pain level is 3-4 /10 and remains constant. Reports the exercises are helping  Eval:  Pt reports having a couple of back surgeries a couple of years ago, one major decompression surgery and the other a revision. Pt reports being out of work since March the 15th and his retirement date was yesterday. 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 Pt also reports being so exhausted from work that he did not have any energy to do anything after work. Pt reports pain rating of 6/10 after walking for extended distances but tries to walk a mile everyday. He reports still being numb in R butt cheek. Can not stay in one position for longer than an hour and reports being very  stiff in the morning time. Pt reports wanting to get RLE stronger as it is visibly smaller and wants to be able to get his stamina back to be able to walk with his wife.  PERTINENT HISTORY:  -2 back surgery's -shoulder surgery left, 2014 -out of work from March 15th, retirement date 12/28/23  PAIN:  Are you having pain? Yes: NPRS scale: 6/10 Pain location: right hip, back, right side Pain description: ache, sudden move brings on sharp pain, dull Aggravating factors: worse in the morning, prolonged sitting standing, walking, constant bending, stooping, twisting, jarring in the car Relieving factors: get up and get going, 2 tylenol    PRECAUTIONS: None  RED FLAGS: Bowel or bladder incontinence: Yes: decreased stream   Slow gain in weight, accounts it to inability  WEIGHT BEARING RESTRICTIONS: No  FALLS:  Has patient fallen in last 6 months? No   OCCUPATION: janitor  PLOF: Independent and Needs assistance with ADLs, wife to help with putting belt on and putting socks and shoes on   PATIENT GOALS: like to be able to get stronger in legs and hips, get back to jogging, better endurance  NEXT MD VISIT: after therapy  OBJECTIVE:  Note: Objective measures were completed at Evaluation unless otherwise noted.  DIAGNOSTIC FINDINGS:  AP lateral lumbar images are obtained and reviewed this shows lumbar  decompression L3-4 L4-5.  There is to space narrowing at L3-4 L4-5 and  left at L5-S1.  Straightening of the lumbar spine no anterolisthesis.   Laminectomy defect noted.  Hips and sacroiliac joints appear normal.   Impression post decompression L3-4 L4-5.  Disc base narrowing at those  levels has progressed from 06/16/2021 MRI scan.   PATIENT SURVEYS:  Modified Oswestry 20/50   COGNITION: Overall cognitive status: Within functional limits for tasks assessed     SENSATION: Light touch: Impaired  Right LE decreased sensation compared to left, thigh to foot   POSTURE: rounded  shoulders and decreased lumbar lordosis  PALPATION: Popping with lumbar segement  LUMBAR ROM:   AROM eval  Flexion 75 degrees, pain  Extension 10 pain  Right lateral flexion 15 degrees, pain at end ROM  Left lateral flexion 10 degrees, fingers almost to knee  Right rotation 50%  Left rotation 75%   (Blank rows = not tested)  LOWER EXTREMITY ROM:     Active  Right eval Left eval Right 01/04/24 01/04/24  Hip flexion      Hip extension [facility]    Hip abduction [facility]    Hip adduction      Hip internal rotation      Hip external rotation      Knee flexion  [facility] WFL    Knee extension [facility] WFL    Ankle dorsiflexion   5 degree 3  Ankle plantarflexion      Ankle inversion      Ankle eversion       (Blank rows = not tested)  LOWER EXTREMITY MMT:    MMT Right eval Left eval Right 01/04/24 Left 01/04/24  Hip flexion 4- 4    Hip extension 4, pain in back 4+    Hip abduction 4- 4    Hip adduction 4 4    Hip internal rotation      Hip external rotation      Knee flexion 3 4    Knee extension   4+ 4+  Ankle dorsiflexion   2+ 2+  Ankle plantarflexion      Ankle inversion      Ankle eversion       (Blank rows = not tested)  LUMBAR SPECIAL TESTS:  Straight leg raise test: Positive Left 55 degrees;  Negative left 60 degrees and Slump test: Negative  FUNCTIONAL TESTS:  5 times sit to stand: 16.8 2 minute walk test: 292 feet  GAIT: Distance walked: 292 Assistive device utilized: None Level of assistance: Complete Independence Comments: some back pain, decreased speed, decreased stride length  TREATMENT DATE:  01/19/2024  Recumbent bike seat 14, level 2 x 5' Standing:  heelraises on incline 20X (minimal ROM)  Dorsiflexion on decline 20X  Hip abduction GTB 2X10 each  Hip extension GTB 2X10 each  Hip flexion alternating GTB 2X10 each  Functional Squats 2X10  Forward lunges onto 4" step no UE 2X10 bilaterally  Tandme stance 30"X2 each LE lead intermittent HHA   Vectors  with 1 HHA 5X5" each side 2 sets  SLS 30"X2 intermittent HHA (Rt harder only 8" max without UE assist  Hamstring stretch using 14" step 2X30" each LE   01/13/2024  -Recumbent bike x 4.5' -LTR x 5 bilaterally for 20-30'' hold -SKTC with 5x5-10'' hold -Sidelying clamshell with GTB 2 x 15 w/ 3sec isometric-neuro-re-ed for muscle stimulation -Sit/stands with GTB at knees x 12 -Side stepping with GTB at knees 2 x 30'' -Standing against wall ankle DF x 20- educated on Hep progressing to 25 rep max and walking feet out further  -Tandem balance 2 x 1' bilaterally for Hip extension and balance.   01/04/24: Reviewed goals MMT ROM measurement Straight leg test   Squat front of chair  March alternating 10x 3" Supine: Instructed log rolling LTR 5x 10" SKTC with towel 2x 30" Hamstring stretch 3x 30"  12/29/2023   Evaluation: -ROM measured, Strength assessed, HEP prescribed, pt educated on prognosis, findings, and importance of HEP compliance if given.                                                                                                                                   PATIENT EDUCATION:  Education details: Pt  was educated on findings of PT evaluation, prognosis, frequency of therapy visits and rationale, attendance policy, and HEP if given.   Person educated: Patient Education method: Explanation and Verbal cues Education comprehension: verbalized understanding and needs further education  HOME EXERCISE PROGRAM: Squats to chair, 3 sets of 10 daily Walk 15-30 minutes per day 5/7 days per week  Access Code: [email or web address] URL: [email or web address] Date: 01/13/2024 Prepared by: Irene Mannheim  Exercises - Squat with Chair Touch  - 2 x daily - 7 x weekly - 2 sets - 10 reps - 3" hold - Hooklying Single Knee to Chest Stretch with Towel  - 2 x daily - 7 x weekly - 1 sets - 3 reps - 30 hold - Supine Hamstring Stretch with Strap  - 2 x daily - 7 x weekly - 1 sets - 3  reps - 30" hold - Standing Tandem Balance with Counter Support  - 1 x daily - 7 x weekly - 3 sets - 10 reps - Clamshell with Resistance  - 1 x daily - 7 x weekly - 3 sets - 10 reps - Side Stepping with Resistance at Thighs  - 1 x daily - 7 x weekly - 3 sets - 3-5 reps - 30 hold - Heel Toe Raises with Counter Support  - 1 x daily - 7 x weekly - 3 sets - 10 reps  ASSESSMENT:  CLINICAL IMPRESSION: Focused on functional strengthening today adding theraband to hip exercises and progressing other stability challenges.  Added forward lunges without UE's maintaining good stability and form.  Additional set of therex added as well.  Pt with most difficulty completing Single leg stance, specifically on Rt LE.  Encouraged to work on these at home.  Added stretches in standing for hamstring and gastroc with improvement voiced following.  Will add these to HEP nest session. Pt did not require any rest breaks this session. Tolerated therapy session well with noted diaphoresis during session, no pain reported.  Pt will benefit from skilled Physical Therapy services to address deficits/limitations in order to improve functional and QOL.    OBJECTIVE IMPAIRMENTS: Abnormal gait, decreased activity tolerance, decreased endurance, decreased mobility, difficulty walking, decreased ROM, decreased strength, impaired sensation, and pain.   ACTIVITY LIMITATIONS: carrying, lifting, bending, sitting, squatting, stairs, bed mobility, and locomotion level  PARTICIPATION LIMITATIONS: meal prep, cleaning, laundry, shopping, community activity, occupation, and yard work  PERSONAL FACTORS: Fitness, Past/current experiences, and Time since onset of injury/illness/exacerbation are also affecting patient's functional outcome.   REHAB POTENTIAL: Good  CLINICAL DECISION MAKING: Stable/uncomplicated  EVALUATION COMPLEXITY: Low   GOALS: Goals reviewed with patient? No  SHORT TERM GOALS: Target date: 01/19/24  Pt will be  independent with HEP in order to demonstrate participation in Physical Therapy POC.  Baseline: Goal status: INITIAL  2.  Pt will report 4/10 pain at worst with mobility in order to demonstrate improved pain with ADLs.  Baseline: 6/10 Goal status: INITIAL  LONG TERM GOALS: Target date: 02/09/24  Pt will improve 5TSTS by at least 2.3 seconds in order to demonstrate improved functional strength to return to desired activities.  Baseline: see objective.  Goal status: INITIAL  2.  Pt will improve 2 MWT by 140 feet in order to demonstrate improved functional ambulatory capacity in community setting.  Baseline: see objective.  Goal status: INITIAL  3.  Pt will improve Modified Oswestry score by at least 6 points in order to demonstrate improved pain with functional goals and outcomes. Baseline: see  objective.  Goal status: INITIAL  4.  Pt will report 2/10 pain with mobility in order to demonstrate reduced pain with ADLs lasting greater than 30 minutes.  Baseline: see objective.  Goal status: INITIAL   PLAN:  PT FREQUENCY: 2x/week  PT DURATION: 6 weeks  PLANNED INTERVENTIONS: 97110-Therapeutic exercises, 97530- Therapeutic activity, [id number]- Neuromuscular re-education, 97535- Self Care, [id number]- Manual therapy, [id number]- Gait training, Spinal mobilization, Cryotherapy, and Moist heat.  PLAN FOR NEXT SESSION: continue with focus on LE strengthening and and core stability.   Provide with hamstring/gastroc stretches for HEP next session.   Lorenso Romance, PTA/CLT [facility] Ph: [phone]  2:35 PM, 01/19/24

## 2024-01-20 ENCOUNTER — Telehealth: Payer: Self-pay | Admitting: *Deleted

## 2024-01-20 ENCOUNTER — Encounter: Payer: Self-pay | Admitting: Internal Medicine

## 2024-01-20 ENCOUNTER — Ambulatory Visit: Admitting: Internal Medicine

## 2024-01-20 VITALS — BP 120/78 | HR 89 | Temp 98.8°F | Ht 72.0 in | Wt 255.6 lb

## 2024-01-20 DIAGNOSIS — Z7901 Long term (current) use of anticoagulants: Secondary | ICD-10-CM

## 2024-01-20 DIAGNOSIS — Z860101 Personal history of adenomatous and serrated colon polyps: Secondary | ICD-10-CM

## 2024-01-20 DIAGNOSIS — K219 Gastro-esophageal reflux disease without esophagitis: Secondary | ICD-10-CM

## 2024-01-20 NOTE — Telephone Encounter (Signed)
  Request for patient to stop medication prior to procedure or is needing cleareance  01/20/24  Brandon Burton [birth year]  What type of surgery is being performed? Colonoscopy  When is surgery scheduled? TBD  What type of clearance is required (medical or pharmacy to hold medication or both? medication  Are there any medications that need to be held prior to surgery and how long? Coumadin  x 5 days  Name of physician performing surgery?  Dr. Goble Last [facility] Phone: [phone] Fax: [phone]  Anethesia type (none, local, MAC, general)? MAC

## 2024-01-20 NOTE — Patient Instructions (Addendum)
 We will reach out to your cardiologist about holding your Coumadin  x 5 days prior to colonoscopy.  Once we have heard back from them, we will call you to schedule your procedure.  Continue on omeprazole  for your chronic reflux.  It was very nice meeting both of you today.  Dr. Mordechai April

## 2024-01-20 NOTE — Progress Notes (Signed)
 Referring Provider: Artemisa Bile, MD Primary Care Physician:  Artemisa Bile, MD Primary GI:  Dr. Mordechai April  No chief complaint on file.   HPI:   Brandon Burton is a 58 y.o. male who presents to clinic today for yearly follow-up visit.  Chronic GERD: Well-controlled omeprazole  40 mg daily.  No dysphagia odynophagia.  No epigastric or chest pain.  Personal history of adenomatous colon polyps:   Last colonoscopy 11/18/2018 sigmoid diverticulosis, hemorrhoids, otherwise unremarkable.  Recommended 5-year recall.  Colonoscopy 07/22/2015 with 7 polyps removed.  Tubular adenomas and benign polyps on pathology.  Repeat  No family history of colorectal malignancy.  No melena hematochezia.  No unintentional weight loss.  Patient chronically on Coumadin  for right lower extremity DVT.  Previously on Xarelto  eventually switched due to ongoing clot formation.  Past Medical History:  Diagnosis Date   DVT (deep venous thrombosis) (HCC)    GERD (gastroesophageal reflux disease)    Hypertension     Past Surgical History:  Procedure Laterality Date   COLONOSCOPY N/A 07/22/2015   SLF:7 polyps removed/mild diverticulosis/moderate internal hemorrhoids   COLONOSCOPY N/A 11/18/2018   Diverticulosis, hemorrhoids, otherwise normal. 2025 due.   EYE SURGERY     LUMBAR LAMINECTOMY N/A 12/10/2021   Procedure: LUMBAR RE-EXPLORATION;  Surgeon: Adah Acron, MD;  Location: [facility] OR;  Service: Orthopedics;  Laterality: N/A;   LUMBAR LAMINECTOMY/DECOMPRESSION MICRODISCECTOMY N/A 11/26/2021   Procedure: Lumbar three-four, Lumbar four-five Central Decompression, Lumbar Three-Four Microdiscectomhy;  Surgeon: Adah Acron, MD;  Location: [facility] OR;  Service: Orthopedics;  Laterality: N/A;   SHOULDER SURGERY Left 09/28/2013   fix previous clavicular fracture complication    Current Outpatient Medications  Medication Sig Dispense Refill   amLODipine  (NORVASC ) 10 MG tablet Take 10 mg by mouth daily.     amoxicillin  (AMOXIL) 500 MG capsule Take 500 mg by mouth 4 (four) times daily.     methocarbamol  (ROBAXIN ) 500 MG tablet TAKE 1 TABLET(500 MG) BY MOUTH THREE TIMES DAILY 60 tablet 1   omeprazole  (PRILOSEC) 40 MG capsule TAKE 1 CAPSULE(40 MG) BY MOUTH DAILY 90 capsule 1   tadalafil (CIALIS) 20 MG tablet SMARTSIG:1 Tablet(s) By Mouth Every 36 Hours PRN     warfarin (COUMADIN ) 5 MG tablet TAKE 1 TO 2 TABLETS BY MOUTH DAILY AS DIRECTED BY COUMADIN  CLINIC 60 tablet 3   No current facility-administered medications for this visit.    Allergies as of 01/20/2024   (No Known Allergies)    Family History  Problem Relation Age of Onset   Thyroid cancer Mother    Heart attack Father    Colon cancer Neg Hx    Colon polyps Neg Hx     Social History   Socioeconomic History   Marital status: Married    Spouse name: Not on file   Number of children: Not on file   Years of education: Not on file   Highest education level: Not on file  Occupational History   Not on file  Tobacco Use   Smoking status: Light Smoker    Types: Cigars   Smokeless tobacco: Never   Tobacco comments:    weekend / social  Vaping Use   Vaping status: Never Used  Substance and Sexual Activity   Alcohol use: Not Currently    Comment: social, every 1-2 months approx 1 drink per occasion   Drug use: Yes    Types: Marijuana    Comment: sometimes for pain   Sexual activity: Not on  file  Other Topics Concern   Not on file  Social History Narrative   Not on file   Social Drivers of Health   Financial Resource Strain: Not on file  Food Insecurity: Food Insecurity Present (10/29/2022)   Hunger Vital Sign    Worried About Running Out of Food in the Last Year: Sometimes true    Ran Out of Food in the Last Year: Sometimes true  Transportation Needs: No Transportation Needs (10/29/2022)   PRAPARE - Administrator, Civil Service (Medical): No    Lack of Transportation (Non-Medical): No  Physical Activity: Not on file   Stress: Not on file  Social Connections: Unknown (02/10/2022)   Received from [facility]   Social Network    Social Network: Not on file    Subjective: Review of Systems  Constitutional:  Negative for chills and fever.  HENT:  Negative for congestion and hearing loss.   Eyes:  Negative for blurred vision and double vision.  Respiratory:  Negative for cough and shortness of breath.   Cardiovascular:  Negative for chest pain and palpitations.  Gastrointestinal:  Positive for heartburn. Negative for abdominal pain, blood in stool, constipation, diarrhea, melena and vomiting.  Genitourinary:  Negative for dysuria and urgency.  Musculoskeletal:  Negative for joint pain and myalgias.  Skin:  Negative for itching and rash.  Neurological:  Negative for dizziness and headaches.  Psychiatric/Behavioral:  Negative for depression. The patient is not nervous/anxious.      Objective: There were no vitals taken for this visit. Physical Exam Constitutional:      Appearance: Normal appearance.  HENT:     Head: Normocephalic and atraumatic.  Eyes:     Extraocular Movements: Extraocular movements intact.     Conjunctiva/sclera: Conjunctivae normal.  Cardiovascular:     Rate and Rhythm: Normal rate and regular rhythm.  Pulmonary:     Effort: Pulmonary effort is normal.     Breath sounds: Normal breath sounds.  Abdominal:     General: Bowel sounds are normal.     Palpations: Abdomen is soft.  Musculoskeletal:        General: Normal range of motion.     Cervical back: Normal range of motion and neck supple.  Skin:    General: Skin is warm.  Neurological:     General: No focal deficit present.     Mental Status: He is alert and oriented to person, place, and time.  Psychiatric:        Mood and Affect: Mood normal.        Behavior: Behavior normal.      Assessment/Plan:  1.  Chronic GERD-well-controlled omeprazole  daily.  Will continue.  2.  Personal history of  adenomatous colon polyps-we will schedule for surveillance colonoscopy today. The risks including infection, bleed, or perforation as well as benefits, limitations, alternatives and imponderables have been reviewed with the patient. Questions have been answered. All parties agreeable.  3.  Chronic anticoagulation with Coumadin -will reach out to cardiology for clearance to hold Coumadin  x 5 days.  Appreciate their help immensely.   01/20/2024 3:02 PM   Disclaimer: This note was dictated with voice recognition software. Similar sounding words can inadvertently be transcribed and may not be corrected upon review.

## 2024-01-21 ENCOUNTER — Encounter (HOSPITAL_COMMUNITY)

## 2024-01-21 ENCOUNTER — Encounter (HOSPITAL_COMMUNITY): Payer: Self-pay

## 2024-01-21 NOTE — Telephone Encounter (Signed)
 Patient with diagnosis of recurrent on warfarin for anticoagulation.    Procedure: Colonoscopy  Date of procedure: TBD  Provoked DVT in March 2023 following back surgery  (03/23/2022): New occlusive thrombus in right femoral vein in the proximal mid thigh; previously identified thrombus within distal thigh femoral vein and calf veins remains present but is now nonocclusive (07/20/2022): No convincing evidence of acute DVT.  Interval resolution of DVT from within the femoral artery.  Chronic/subacute thrombus persists in deep gastrocnemius veins of the calf. (10/07/2022): Acute below the knee DVT involving gastrocnemius vein with chronic nonocclusive DVT and distal superficial femoral vein  CrCl 101 ml/min Platelet count 424  Per office protocol, patient can hold warfarin for 5 days prior to procedure.    Patient WILL need bridging with Lovenox (enoxaparin) around procedure.  **This guidance is not considered finalized until pre-operative APP has relayed final recommendations.**

## 2024-01-21 NOTE — Therapy (Signed)
 [facility] Outpatient Rehabilitation at [facility] [address] [place], [place], [address] Phone: [phone]   Fax:  [phone]  Patient Details  Name: Brandon Burton MRN: [id number] Date of Birth: [birth year] Referring Provider:  No ref. provider found  Encounter Date: 01/21/2024  Called regarding no show #1, went straight to voicemail, unable to leave message.   Gatha Kaska PT, DPT [facility] [phone] office  Gatha Kaska, PT 01/21/2024, 9:29 AM  [facility] Outpatient Rehabilitation at [facility] [address] [place], [place], [address] Phone: [phone]   Fax:  [phone]

## 2024-01-21 NOTE — Telephone Encounter (Signed)
 Case has been reviewed by our clinical pharmacist who recommended Lovenox bridging prior to the upcoming colonoscopy procedure.  Once the date of the colonoscopy procedure has been set, patient will need to inform our Coumadin  clinic at least a week early so the Lovenox bridging can be arranged.

## 2024-01-24 ENCOUNTER — Telehealth (HOSPITAL_COMMUNITY): Payer: Self-pay | Admitting: Physical Therapy

## 2024-01-24 ENCOUNTER — Encounter (HOSPITAL_COMMUNITY): Admitting: Physical Therapy

## 2024-01-24 NOTE — Telephone Encounter (Signed)
 Pt did not show for appt.  Called but unable to leave VM due to mailbox being full.  Lorenso Romance, PTA/CLT [facility] Rehabilitation [facility] Ph: [phone]

## 2024-01-26 ENCOUNTER — Encounter (HOSPITAL_COMMUNITY): Payer: Self-pay

## 2024-01-26 ENCOUNTER — Ambulatory Visit (HOSPITAL_COMMUNITY)

## 2024-01-26 DIAGNOSIS — M545 Low back pain, unspecified: Secondary | ICD-10-CM

## 2024-01-26 DIAGNOSIS — R262 Difficulty in walking, not elsewhere classified: Secondary | ICD-10-CM

## 2024-01-26 DIAGNOSIS — M6281 Muscle weakness (generalized): Secondary | ICD-10-CM | POA: Diagnosis not present

## 2024-01-26 DIAGNOSIS — G8929 Other chronic pain: Secondary | ICD-10-CM | POA: Diagnosis not present

## 2024-01-26 NOTE — Therapy (Signed)
 OUTPATIENT PHYSICAL THERAPY THORACOLUMBAR TREATMENT   Patient Name: Brandon Burton MRN: [id number] DOB:08/10/1966, 58 y.o., male Today's Date: 01/26/2024  END OF SESSION:  PT End of Session - 01/26/24 1059     Visit Number 5    Number of Visits 12    Date for PT Re-Evaluation 02/09/24    Authorization Type [employer] COMM PPO    Authorization Time Period auth requested    Progress Note Due on Visit 10    PT Start Time 1016    PT Stop Time 1059    PT Time Calculation (min) 43 min    Activity Tolerance Patient tolerated treatment well    Behavior During Therapy WFL for tasks assessed/performed                Past Medical History:  Diagnosis Date   DVT (deep venous thrombosis) (HCC)    GERD (gastroesophageal reflux disease)    Hypertension    Past Surgical History:  Procedure Laterality Date   COLONOSCOPY N/A 07/22/2015   SLF:7 polyps removed/mild diverticulosis/moderate internal hemorrhoids   COLONOSCOPY N/A 11/18/2018   Diverticulosis, hemorrhoids, otherwise normal. 2025 due.   EYE SURGERY     LUMBAR LAMINECTOMY N/A 12/10/2021   Procedure: LUMBAR RE-EXPLORATION;  Surgeon: Adah Acron, MD;  Location: [facility] OR;  Service: Orthopedics;  Laterality: N/A;   LUMBAR LAMINECTOMY/DECOMPRESSION MICRODISCECTOMY N/A 11/26/2021   Procedure: Lumbar three-four, Lumbar four-five Central Decompression, Lumbar Three-Four Microdiscectomhy;  Surgeon: Adah Acron, MD;  Location: [facility] OR;  Service: Orthopedics;  Laterality: N/A;   SHOULDER SURGERY Left 09/28/2013   fix previous clavicular fracture complication   Patient Active Problem List   Diagnosis Date Noted   Disc degeneration, lumbar 12/01/2023   Osteoarthritis 10/08/2023   Acute venous embolism and thrombosis of deep vessels of proximal lower extremity (HCC) 02/15/2023   Long term (current) use of anticoagulants 01/27/2023   HTN (hypertension) 01/27/2023   Surgical site infection 12/23/2021   Medication monitoring encounter  12/23/2021   DVT (deep venous thrombosis) (HCC) 12/17/2021   Wound drainage 12/10/2021   Status post lumbar spine surgery for decompression of spinal cord 12/09/2021   Postoperative CSF leak 12/09/2021   Lumbar stenosis 11/26/2021   Spinal stenosis of lumbar region 07/04/2021   History of colonic polyps 08/16/2018   GERD (gastroesophageal reflux disease) 07/08/2015   Rectal bleeding 07/08/2015   Sprain of ankle 09/10/2009   CLOSED FRACTURE OF CALCANEUS 08/27/2009   SHOULDER STRAIN 02/27/2009    PCP: Artemisa Bile, MD   REFERRING PROVIDER: Adah Acron, MD  REFERRING DIAG: M54.50,G89.29 (ICD-10-CM) - Chronic bilateral low back pain without sciatica  Rationale for Evaluation and Treatment: Rehabilitation  THERAPY DIAG:  Low back pain, unspecified back pain laterality, unspecified chronicity, unspecified whether sciatica present  Muscle weakness (generalized)  Difficulty in walking, not elsewhere classified  ONSET DATE: 03/23 pt reports one major back surgery and then revision 2 weeks later  SUBJECTIVE:  SUBJECTIVE STATEMENT: Pt states he's feeling 3/10. Pt states exercises are helping with his stride, and loosening him up. Pt states the numbness after walking never goes away on the right posterior thigh.   Eval:  Pt reports having a couple of back surgeries a couple of years ago, one major decompression surgery and the other a revision. Pt reports being out of work since March the 15th and his retirement date was yesterday. 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 Pt also reports being so exhausted from work that he did not have any energy to do anything after work. Pt reports pain rating of 6/10 after walking for extended distances but tries to walk a  mile everyday. He reports still being numb in R butt cheek. Can not stay in one position for longer than an hour and reports being very stiff in the morning time. Pt reports wanting to get RLE stronger as it is visibly smaller and wants to be able to get his stamina back to be able to walk with his wife.  PERTINENT HISTORY:  -2 back surgery's -shoulder surgery left, 2014 -out of work from March 15th, retirement date 12/28/23  PAIN:  Are you having pain? Yes: NPRS scale: 6/10 Pain location: right hip, back, right side Pain description: ache, sudden move brings on sharp pain, dull Aggravating factors: worse in the morning, prolonged sitting standing, walking, constant bending, stooping, twisting, jarring in the car Relieving factors: get up and get going, 2 tylenol    PRECAUTIONS: None  RED FLAGS: Bowel or bladder incontinence: Yes: decreased stream   Slow gain in weight, accounts it to inability  WEIGHT BEARING RESTRICTIONS: No  FALLS:  Has patient fallen in last 6 months? No   OCCUPATION: janitor  PLOF: Independent and Needs assistance with ADLs, wife to help with putting belt on and putting socks and shoes on   PATIENT GOALS: like to be able to get stronger in legs and hips, get back to jogging, better endurance  NEXT MD VISIT: after therapy  OBJECTIVE:  Note: Objective measures were completed at Evaluation unless otherwise noted.  DIAGNOSTIC FINDINGS:  AP lateral lumbar images are obtained and reviewed this shows lumbar  decompression L3-4 L4-5.  There is to space narrowing at L3-4 L4-5 and  left at L5-S1.  Straightening of the lumbar spine no anterolisthesis.   Laminectomy defect noted.  Hips and sacroiliac joints appear normal.   Impression post decompression L3-4 L4-5.  Disc base narrowing at those  levels has progressed from 06/16/2021 MRI scan.   PATIENT SURVEYS:  Modified Oswestry 20/50   COGNITION: Overall cognitive status: Within functional limits for tasks  assessed     SENSATION: Light touch: Impaired  Right LE decreased sensation compared to left, thigh to foot   POSTURE: rounded shoulders and decreased lumbar lordosis  PALPATION: Popping with lumbar segement  LUMBAR ROM:   AROM eval  Flexion 75 degrees, pain  Extension 10 pain  Right lateral flexion 15 degrees, pain at end ROM  Left lateral flexion 10 degrees, fingers almost to knee  Right rotation 50%  Left rotation 75%   (Blank rows = not tested)  LOWER EXTREMITY ROM:     Active  Right eval Left eval Right 01/04/24 01/04/24  Hip flexion      Hip extension [facility]    Hip abduction [facility]    Hip adduction      Hip internal  rotation      Hip external rotation      Knee flexion [facility]    Knee extension [facility]    Ankle dorsiflexion   5 degree 3  Ankle plantarflexion      Ankle inversion      Ankle eversion       (Blank rows = not tested)  LOWER EXTREMITY MMT:    MMT Right eval Left eval Right 01/04/24 Left 01/04/24  Hip flexion 4- 4    Hip extension 4, pain in back 4+    Hip abduction 4- 4    Hip adduction 4 4    Hip internal rotation      Hip external rotation      Knee flexion 3 4    Knee extension   4+ 4+  Ankle dorsiflexion   2+ 2+  Ankle plantarflexion      Ankle inversion      Ankle eversion       (Blank rows = not tested)  LUMBAR SPECIAL TESTS:  Straight leg raise test: Positive Left 55 degrees;  Negative left 60 degrees and Slump test: Negative  FUNCTIONAL TESTS:  5 times sit to stand: 16.8 2 minute walk test: 292 feet  GAIT: Distance walked: 292 Assistive device utilized: None Level of assistance: Complete Independence Comments: some back pain, decreased speed, decreased stride length  TREATMENT DATE:  01/26/2024  Therapeutic Exercise: -DKTC, 2 sets of 15 reps, pt cued for pain free ROM -Supine bridges 2 sets of 10 reps, 3 second holds, GTB at knees, pt cued for max hip extension -Lateral stepping 3 laps 20 feet per lap, second 2 with  RTB around ankles, pt cued for upright posture -Forward lunges on bosu, no UE support, 1 set of 5 reps better performance going into RLE, pt cued for core activation and upright posture -LTR on Green exercise ball, 2 set of 10 reps bilaterally, pt cued to remain in pain free ROM  Therapeutic Activity: -Squat to chair with tidal tank, 2 sets of 10 reps, pt cued for core activation -Tidal tank carry 1 lap, 70 feet, marching walk, pr cued for core activation -Sled push/pull 3 laps, 60 feet per lap 40lbs, pt cued for core activation   01/19/2024  Recumbent bike seat 14, level 2 x 5' Standing:  heelraises on incline 20X (minimal ROM)  Dorsiflexion on decline 20X  Hip abduction GTB 2X10 each  Hip extension GTB 2X10 each  Hip flexion alternating GTB 2X10 each  Functional Squats 2X10  Forward lunges onto 4" step no UE 2X10 bilaterally  Tandme stance 30"X2 each LE lead intermittent HHA   Vectors with 1 HHA 5X5" each side 2 sets  SLS 30"X2 intermittent HHA (Rt harder only 8" max without UE assist  Hamstring stretch using 14" step 2X30" each LE   01/13/2024  -Recumbent bike x 4.5' -LTR x 5 bilaterally for 20-30'' hold -SKTC with 5x5-10'' hold -Sidelying clamshell with GTB 2 x 15 w/ 3sec isometric-neuro-re-ed for muscle stimulation -Sit/stands with GTB at knees x 12 -Side stepping with GTB at knees 2 x 30'' -Standing against wall ankle DF x 20- educated on Hep progressing to 25 rep max and walking feet out further  -Tandem balance 2 x 1' bilaterally for Hip extension and balance.  PATIENT EDUCATION:  Education details: Pt was educated on findings of PT evaluation, prognosis, frequency of therapy visits and rationale, attendance policy, and HEP if given.   Person educated: Patient Education method: Explanation and Verbal cues Education comprehension:  verbalized understanding and needs further education  HOME EXERCISE PROGRAM: Squats to chair, 3 sets of 10 daily Walk 15-30 minutes per day 5/7 days per week  Access Code: [email or web address] URL: [email or web address] Date: 01/13/2024 Prepared by: Irene Mannheim  Exercises - Squat with Chair Touch  - 2 x daily - 7 x weekly - 2 sets - 10 reps - 3" hold - Hooklying Single Knee to Chest Stretch with Towel  - 2 x daily - 7 x weekly - 1 sets - 3 reps - 30 hold - Supine Hamstring Stretch with Strap  - 2 x daily - 7 x weekly - 1 sets - 3 reps - 30" hold - Standing Tandem Balance with Counter Support  - 1 x daily - 7 x weekly - 3 sets - 10 reps - Clamshell with Resistance  - 1 x daily - 7 x weekly - 3 sets - 10 reps - Side Stepping with Resistance at Thighs  - 1 x daily - 7 x weekly - 3 sets - 3-5 reps - 30 hold - Heel Toe Raises with Counter Support  - 1 x daily - 7 x weekly - 3 sets - 10 reps  ASSESSMENT:  CLINICAL IMPRESSION: Patient continues to demonstrate increased core and LE strength and improved balance, however antalgic gait still noted. Patient also demonstrates good endurance with aerobic based exercise during today's session with no rest breaks required. Patient able to progress dynamic balance and core activation exercises today with tidal tank walks and STS variation, good performance with minimal verbal cueing. Patient would continue to benefit from skilled physical therapy for improved gait form, increased LE strength, and improved balance for improved quality of life, improved performance with community ambulation and continued progress towards therapy goals.    OBJECTIVE IMPAIRMENTS: Abnormal gait, decreased activity tolerance, decreased endurance, decreased mobility, difficulty walking, decreased ROM, decreased strength, impaired sensation, and pain.   ACTIVITY LIMITATIONS: carrying, lifting, bending, sitting, squatting, stairs, bed mobility, and locomotion  level  PARTICIPATION LIMITATIONS: meal prep, cleaning, laundry, shopping, community activity, occupation, and yard work  PERSONAL FACTORS: Fitness, Past/current experiences, and Time since onset of injury/illness/exacerbation are also affecting patient's functional outcome.   REHAB POTENTIAL: Good  CLINICAL DECISION MAKING: Stable/uncomplicated  EVALUATION COMPLEXITY: Low   GOALS: Goals reviewed with patient? No  SHORT TERM GOALS: Target date: 01/19/24  Pt will be independent with HEP in order to demonstrate participation in Physical Therapy POC.  Baseline: Goal status: INITIAL  2.  Pt will report 4/10 pain at worst with mobility in order to demonstrate improved pain with ADLs.  Baseline: 6/10 Goal status: INITIAL  LONG TERM GOALS: Target date: 02/09/24  Pt will improve 5TSTS by at least 2.3 seconds in order to demonstrate improved functional strength to return to desired activities.  Baseline: see objective.  Goal status: INITIAL  2.  Pt will improve 2 MWT by 140 feet in order to demonstrate improved functional ambulatory capacity in community setting.  Baseline: see objective.  Goal status: INITIAL  3.  Pt will improve Modified Oswestry score by at least 6 points in order to demonstrate improved pain with functional goals and outcomes. Baseline: see objective.  Goal status: INITIAL  4.  Pt will report 2/10 pain with mobility in order  to demonstrate reduced pain with ADLs lasting greater than 30 minutes.  Baseline: see objective.  Goal status: INITIAL   PLAN:  PT FREQUENCY: 2x/week  PT DURATION: 6 weeks  PLANNED INTERVENTIONS: 97110-Therapeutic exercises, 97530- Therapeutic activity, [id number]- Neuromuscular re-education, 97535- Self Care, [id number]- Manual therapy, [id number]- Gait training, Spinal mobilization, Cryotherapy, and Moist heat.  PLAN FOR NEXT SESSION: continue with focus on LE strengthening and and core stability.   Provide with hamstring/gastroc stretches for  HEP next session.   Armond Bertin, PT, DPT [facility] Office: [phone] 11:03 AM, 01/26/24

## 2024-01-26 NOTE — Telephone Encounter (Signed)
 Okay to schedule for procedure.  Patient will need Lovenox bridging with the Coumadin  clinic so once we have a date, will need to let them know.  Thank you

## 2024-01-26 NOTE — Telephone Encounter (Signed)
Attempted to call pt, unable to leave message due to mailbox being full.

## 2024-01-27 NOTE — Telephone Encounter (Signed)
 Will schedule pt once we get providers schedule for June.

## 2024-01-31 ENCOUNTER — Ambulatory Visit (HOSPITAL_COMMUNITY): Attending: Internal Medicine | Admitting: Physical Therapy

## 2024-01-31 DIAGNOSIS — R262 Difficulty in walking, not elsewhere classified: Secondary | ICD-10-CM | POA: Insufficient documentation

## 2024-01-31 DIAGNOSIS — M6281 Muscle weakness (generalized): Secondary | ICD-10-CM | POA: Diagnosis not present

## 2024-01-31 DIAGNOSIS — M545 Low back pain, unspecified: Secondary | ICD-10-CM | POA: Diagnosis not present

## 2024-01-31 NOTE — Therapy (Signed)
 OUTPATIENT PHYSICAL THERAPY THORACOLUMBAR TREATMENT   Patient Name: Brandon Burton MRN: [id number] DOB:12-Oct-1965, 58 y.o., male Today's Date: 01/31/2024  END OF SESSION:  PT End of Session - 01/31/24 1507     Visit Number 6    Number of Visits 12    Date for PT Re-Evaluation 02/09/24    Authorization Type [employer] COMM PPO    Authorization Time Period no auth needed    Progress Note Due on Visit 10    PT Start Time 1103    PT Stop Time 1145    PT Time Calculation (min) 42 min    Activity Tolerance Patient tolerated treatment well    Behavior During Therapy WFL for tasks assessed/performed                 Past Medical History:  Diagnosis Date   DVT (deep venous thrombosis) (HCC)    GERD (gastroesophageal reflux disease)    Hypertension    Past Surgical History:  Procedure Laterality Date   COLONOSCOPY N/A 07/22/2015   SLF:7 polyps removed/mild diverticulosis/moderate internal hemorrhoids   COLONOSCOPY N/A 11/18/2018   Diverticulosis, hemorrhoids, otherwise normal. 2025 due.   EYE SURGERY     LUMBAR LAMINECTOMY N/A 12/10/2021   Procedure: LUMBAR RE-EXPLORATION;  Surgeon: Adah Acron, MD;  Location: [facility] OR;  Service: Orthopedics;  Laterality: N/A;   LUMBAR LAMINECTOMY/DECOMPRESSION MICRODISCECTOMY N/A 11/26/2021   Procedure: Lumbar three-four, Lumbar four-five Central Decompression, Lumbar Three-Four Microdiscectomhy;  Surgeon: Adah Acron, MD;  Location: [facility] OR;  Service: Orthopedics;  Laterality: N/A;   SHOULDER SURGERY Left 09/28/2013   fix previous clavicular fracture complication   Patient Active Problem List   Diagnosis Date Noted   Disc degeneration, lumbar 12/01/2023   Osteoarthritis 10/08/2023   Acute venous embolism and thrombosis of deep vessels of proximal lower extremity (HCC) 02/15/2023   Long term (current) use of anticoagulants 01/27/2023   HTN (hypertension) 01/27/2023   Surgical site infection 12/23/2021   Medication monitoring encounter  12/23/2021   DVT (deep venous thrombosis) (HCC) 12/17/2021   Wound drainage 12/10/2021   Status post lumbar spine surgery for decompression of spinal cord 12/09/2021   Postoperative CSF leak 12/09/2021   Lumbar stenosis 11/26/2021   Spinal stenosis of lumbar region 07/04/2021   History of colonic polyps 08/16/2018   GERD (gastroesophageal reflux disease) 07/08/2015   Rectal bleeding 07/08/2015   Sprain of ankle 09/10/2009   CLOSED FRACTURE OF CALCANEUS 08/27/2009   SHOULDER STRAIN 02/27/2009    PCP: Artemisa Bile, MD   REFERRING PROVIDER: Adah Acron, MD  REFERRING DIAG: M54.50,G89.29 (ICD-10-CM) - Chronic bilateral low back pain without sciatica  Rationale for Evaluation and Treatment: Rehabilitation  THERAPY DIAG:  No diagnosis found.  ONSET DATE: 03/23 pt reports one major back surgery and then revision 2 weeks later  SUBJECTIVE:  SUBJECTIVE STATEMENT: Pt states he must have pulled a muscle at his last visit because he's been down with his back all weekend. Rt lower back 8/10 pain.  Eval:  Pt reports having a couple of back surgeries a couple of years ago, one major decompression surgery and the other a revision. Pt reports being out of work since March the 15th and his retirement date was yesterday. 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 Pt also reports being so exhausted from work that he did not have any energy to do anything after work. Pt reports pain rating of 6/10 after walking for extended distances but tries to walk a mile everyday. He reports still being numb in R butt cheek. Can not stay in one position for longer than an hour and reports being very stiff in the morning time. Pt reports wanting to get RLE stronger as it is visibly smaller  and wants to be able to get his stamina back to be able to walk with his wife.  PERTINENT HISTORY:  -2 back surgery's -shoulder surgery left, 2014 -out of work from March 15th, retirement date 12/28/23  PAIN:  Are you having pain? Yes: NPRS scale: 8/10 Pain location: right hip, back, right side Pain description: ache, sudden move brings on sharp pain, dull Aggravating factors: worse in the morning, prolonged sitting standing, walking, constant bending, stooping, twisting, jarring in the car Relieving factors: get up and get going, 2 tylenol    PRECAUTIONS: None  RED FLAGS: Bowel or bladder incontinence: Yes: decreased stream   Slow gain in weight, accounts it to inability  WEIGHT BEARING RESTRICTIONS: No  FALLS:  Has patient fallen in last 6 months? No   OCCUPATION: janitor  PLOF: Independent and Needs assistance with ADLs, wife to help with putting belt on and putting socks and shoes on   PATIENT GOALS: like to be able to get stronger in legs and hips, get back to jogging, better endurance  NEXT MD VISIT: after therapy  OBJECTIVE:  Note: Objective measures were completed at Evaluation unless otherwise noted.  DIAGNOSTIC FINDINGS:  AP lateral lumbar images are obtained and reviewed this shows lumbar  decompression L3-4 L4-5.  There is to space narrowing at L3-4 L4-5 and  left at L5-S1.  Straightening of the lumbar spine no anterolisthesis.   Laminectomy defect noted.  Hips and sacroiliac joints appear normal.   Impression post decompression L3-4 L4-5.  Disc base narrowing at those  levels has progressed from 06/16/2021 MRI scan.   PATIENT SURVEYS:  Modified Oswestry 20/50   COGNITION: Overall cognitive status: Within functional limits for tasks assessed     SENSATION: Light touch: Impaired  Right LE decreased sensation compared to left, thigh to foot   POSTURE: rounded shoulders and decreased lumbar lordosis  PALPATION: Popping with lumbar segement  LUMBAR  ROM:   AROM eval  Flexion 75 degrees, pain  Extension 10 pain  Right lateral flexion 15 degrees, pain at end ROM  Left lateral flexion 10 degrees, fingers almost to knee  Right rotation 50%  Left rotation 75%   (Blank rows = not tested)  LOWER EXTREMITY ROM:     Active  Right eval Left eval Right 01/04/24 01/04/24  Hip flexion      Hip extension [facility]    Hip abduction [facility]    Hip adduction      Hip internal rotation  Hip external rotation      Knee flexion [facility]    Knee extension [facility]    Ankle dorsiflexion   5 degree 3  Ankle plantarflexion      Ankle inversion      Ankle eversion       (Blank rows = not tested)  LOWER EXTREMITY MMT:    MMT Right eval Left eval Right 01/04/24 Left 01/04/24  Hip flexion 4- 4    Hip extension 4, pain in back 4+    Hip abduction 4- 4    Hip adduction 4 4    Hip internal rotation      Hip external rotation      Knee flexion 3 4    Knee extension   4+ 4+  Ankle dorsiflexion   2+ 2+  Ankle plantarflexion      Ankle inversion      Ankle eversion       (Blank rows = not tested)  LUMBAR SPECIAL TESTS:  Straight leg raise test: Positive Left 55 degrees;  Negative left 60 degrees and Slump test: Negative  FUNCTIONAL TESTS:  5 times sit to stand: 16.8 2 minute walk test: 292 feet  GAIT: Distance walked: 292 Assistive device utilized: None Level of assistance: Complete Independence Comments: some back pain, decreased speed, decreased stride length  TREATMENT DATE:  01/31/24 Nustep seat level 2 UE/LE [place] 6 minutes Standing:  squats 2X10  Lunges onto BOSU  no UE support 2X10 each LE  Standing extension 10X  Standing extension against // bars 10X   Lateral stepping  BTB around knees 10 ft line 5RT  Hamstring stretch 12" step 3X30" each LE   01/26/2024  Therapeutic Exercise: -DKTC, 2 sets of 15 reps, pt cued for pain free ROM -Supine bridges 2 sets of 10 reps, 3 second holds, GTB at knees, pt cued for max hip  extension -Lateral stepping 3 laps 20 feet per lap, second 2 with RTB around ankles, pt cued for upright posture -Forward lunges on bosu, no UE support, 1 set of 5 reps better performance going into RLE, pt cued for core activation and upright posture -LTR on Green exercise ball, 2 set of 10 reps bilaterally, pt cued to remain in pain free ROM  Therapeutic Activity: -Squat to chair with tidal tank, 2 sets of 10 reps, pt cued for core activation -Tidal tank carry 1 lap, 70 feet, marching walk, pr cued for core activation -Sled push/pull 3 laps, 60 feet per lap 40lbs, pt cued for core activation   01/19/2024  Recumbent bike seat 14, level 2 x 5' Standing:  heelraises on incline 20X (minimal ROM)  Dorsiflexion on decline 20X  Hip abduction GTB 2X10 each  Hip extension GTB 2X10 each  Hip flexion alternating GTB 2X10 each  Functional Squats 2X10  Forward lunges onto 4" step no UE 2X10 bilaterally  Tandme stance 30"X2 each LE lead intermittent HHA   Vectors with 1 HHA 5X5" each side 2 sets  SLS 30"X2 intermittent HHA (Rt harder only 8" max without UE assist  Hamstring stretch using 14" step 2X30" each LE   01/13/2024  -Recumbent bike x 4.5' -LTR x 5 bilaterally for 20-30'' hold -SKTC with 5x5-10'' hold -Sidelying clamshell with GTB 2 x 15 w/ 3sec isometric-neuro-re-ed for muscle stimulation -Sit/stands with GTB at knees x 12 -Side stepping with GTB at knees 2 x 30'' -Standing against wall ankle DF x 20- educated on Hep progressing to 25 rep  max and walking feet out further  -Tandem balance 2 x 1' bilaterally for Hip extension and balance.                                                                                                                                    PATIENT EDUCATION:  Education details: Pt was educated on findings of PT evaluation, prognosis, frequency of therapy visits and rationale, attendance policy, and HEP if given.   Person educated: Patient Education  method: Explanation and Verbal cues Education comprehension: verbalized understanding and needs further education  HOME EXERCISE PROGRAM: Squats to chair, 3 sets of 10 daily Walk 15-30 minutes per day 5/7 days per week  Access Code: [email or web address] URL: [email or web address] Date: 01/13/2024 Prepared by: Irene Mannheim  Exercises - Squat with Chair Touch  - 2 x daily - 7 x weekly - 2 sets - 10 reps - 3" hold - Hooklying Single Knee to Chest Stretch with Towel  - 2 x daily - 7 x weekly - 1 sets - 3 reps - 30 hold - Supine Hamstring Stretch with Strap  - 2 x daily - 7 x weekly - 1 sets - 3 reps - 30" hold - Standing Tandem Balance with Counter Support  - 1 x daily - 7 x weekly - 3 sets - 10 reps - Clamshell with Resistance  - 1 x daily - 7 x weekly - 3 sets - 10 reps - Side Stepping with Resistance at Thighs  - 1 x daily - 7 x weekly - 3 sets - 3-5 reps - 30 hold - Heel Toe Raises with Counter Support  - 1 x daily - 7 x weekly - 3 sets - 10 reps  Date: 01/31/2024 Prepared by: Vickii Grand Exercises - Standing Hamstring Stretch on Chair  - 2 x daily - 7 x weekly - 1 sets - 3 reps - 30 sec hold - Standing Gastroc Stretch on Step with Counter Support  - 2 x daily - 7 x weekly - 1 sets - 3 reps - 30 sec hold   ASSESSMENT:  CLINICAL IMPRESSION: Began session on nustep today with pt reporting feeling better and more loose following.  Continued with LE strengthening and stretching.  Held the tidal tank activities as pt voices feels this irritated his back last visit.  Did continue with hamstring/gastroc stretches and added these to HEP with written instructions this session.  Pt without pain behaviors during session or complaints of discomfort.  Overall improved with much reduced symptoms at end of session.   Patient will continue to benefit from skilled physical therapy for improved gait form, increased LE strength, and improved balance for improved quality of life, improved performance  with community ambulation and continued progress towards therapy goals.    OBJECTIVE IMPAIRMENTS: Abnormal gait, decreased activity tolerance, decreased endurance, decreased mobility, difficulty walking, decreased ROM, decreased strength, impaired sensation, and pain.   ACTIVITY LIMITATIONS:  carrying, lifting, bending, sitting, squatting, stairs, bed mobility, and locomotion level  PARTICIPATION LIMITATIONS: meal prep, cleaning, laundry, shopping, community activity, occupation, and yard work  PERSONAL FACTORS: Fitness, Past/current experiences, and Time since onset of injury/illness/exacerbation are also affecting patient's functional outcome.   REHAB POTENTIAL: Good  CLINICAL DECISION MAKING: Stable/uncomplicated  EVALUATION COMPLEXITY: Low   GOALS: Goals reviewed with patient? No  SHORT TERM GOALS: Target date: 01/19/24  Pt will be independent with HEP in order to demonstrate participation in Physical Therapy POC.  Baseline: Goal status: INITIAL  2.  Pt will report 4/10 pain at worst with mobility in order to demonstrate improved pain with ADLs.  Baseline: 6/10 Goal status: INITIAL  LONG TERM GOALS: Target date: 02/09/24  Pt will improve 5TSTS by at least 2.3 seconds in order to demonstrate improved functional strength to return to desired activities.  Baseline: see objective.  Goal status: INITIAL  2.  Pt will improve 2 MWT by 140 feet in order to demonstrate improved functional ambulatory capacity in community setting.  Baseline: see objective.  Goal status: INITIAL  3.  Pt will improve Modified Oswestry score by at least 6 points in order to demonstrate improved pain with functional goals and outcomes. Baseline: see objective.  Goal status: INITIAL  4.  Pt will report 2/10 pain with mobility in order to demonstrate reduced pain with ADLs lasting greater than 30 minutes.  Baseline: see objective.  Goal status: INITIAL   PLAN:  PT FREQUENCY: 2x/week  PT  DURATION: 6 weeks  PLANNED INTERVENTIONS: 97110-Therapeutic exercises, 97530- Therapeutic activity, [id number]- Neuromuscular re-education, 97535- Self Care, [id number]- Manual therapy, [id number]- Gait training, Spinal mobilization, Cryotherapy, and Moist heat.  PLAN FOR NEXT SESSION: continue with focus on LE strengthening and and core stability.      Armond Bertin, PT, DPT [facility] Office: [phone] 3:07 PM, 01/31/24

## 2024-02-02 ENCOUNTER — Ambulatory Visit (HOSPITAL_COMMUNITY)

## 2024-02-02 ENCOUNTER — Encounter (HOSPITAL_COMMUNITY): Payer: Self-pay

## 2024-02-02 DIAGNOSIS — M545 Low back pain, unspecified: Secondary | ICD-10-CM

## 2024-02-02 DIAGNOSIS — M6281 Muscle weakness (generalized): Secondary | ICD-10-CM | POA: Diagnosis not present

## 2024-02-02 DIAGNOSIS — R262 Difficulty in walking, not elsewhere classified: Secondary | ICD-10-CM

## 2024-02-02 NOTE — Therapy (Signed)
 OUTPATIENT PHYSICAL THERAPY THORACOLUMBAR TREATMENT   Patient Name: Brandon Burton MRN: [id number] DOB:1966/01/28, 58 y.o., male Today's Date: 02/02/2024  END OF SESSION:  PT End of Session - 02/02/24 1113     Visit Number 7    Number of Visits 12    Date for PT Re-Evaluation 02/09/24    Authorization Type [employer] COMM PPO    Authorization Time Period no auth needed    Progress Note Due on Visit 10    PT Start Time 1103    PT Stop Time 1145    PT Time Calculation (min) 42 min    Activity Tolerance Patient tolerated treatment well;Patient limited by pain    Behavior During Therapy WFL for tasks assessed/performed                  Past Medical History:  Diagnosis Date   DVT (deep venous thrombosis) (HCC)    GERD (gastroesophageal reflux disease)    Hypertension    Past Surgical History:  Procedure Laterality Date   COLONOSCOPY N/A 07/22/2015   SLF:7 polyps removed/mild diverticulosis/moderate internal hemorrhoids   COLONOSCOPY N/A 11/18/2018   Diverticulosis, hemorrhoids, otherwise normal. 2025 due.   EYE SURGERY     LUMBAR LAMINECTOMY N/A 12/10/2021   Procedure: LUMBAR RE-EXPLORATION;  Surgeon: Adah Acron, MD;  Location: [facility] OR;  Service: Orthopedics;  Laterality: N/A;   LUMBAR LAMINECTOMY/DECOMPRESSION MICRODISCECTOMY N/A 11/26/2021   Procedure: Lumbar three-four, Lumbar four-five Central Decompression, Lumbar Three-Four Microdiscectomhy;  Surgeon: Adah Acron, MD;  Location: [facility] OR;  Service: Orthopedics;  Laterality: N/A;   SHOULDER SURGERY Left 09/28/2013   fix previous clavicular fracture complication   Patient Active Problem List   Diagnosis Date Noted   Disc degeneration, lumbar 12/01/2023   Osteoarthritis 10/08/2023   Acute venous embolism and thrombosis of deep vessels of proximal lower extremity (HCC) 02/15/2023   Long term (current) use of anticoagulants 01/27/2023   HTN (hypertension) 01/27/2023   Surgical site infection 12/23/2021   Medication  monitoring encounter 12/23/2021   DVT (deep venous thrombosis) (HCC) 12/17/2021   Wound drainage 12/10/2021   Status post lumbar spine surgery for decompression of spinal cord 12/09/2021   Postoperative CSF leak 12/09/2021   Lumbar stenosis 11/26/2021   Spinal stenosis of lumbar region 07/04/2021   History of colonic polyps 08/16/2018   GERD (gastroesophageal reflux disease) 07/08/2015   Rectal bleeding 07/08/2015   Sprain of ankle 09/10/2009   CLOSED FRACTURE OF CALCANEUS 08/27/2009   SHOULDER STRAIN 02/27/2009    PCP: Artemisa Bile, MD   REFERRING PROVIDER: Adah Acron, MD  REFERRING DIAG: M54.50,G89.29 (ICD-10-CM) - Chronic bilateral low back pain without sciatica  Rationale for Evaluation and Treatment: Rehabilitation  THERAPY DIAG:  Low back pain, unspecified back pain laterality, unspecified chronicity, unspecified whether sciatica present  Difficulty in walking, not elsewhere classified  Muscle weakness (generalized)  ONSET DATE: 03/23 pt reports one major back surgery and then revision 2 weeks later  SUBJECTIVE:  SUBJECTIVE STATEMENT: Pt reports still being sore from last session. Pt states the ball rollouts might have caused it. Soreness 6/10.  Pt states he must have pulled a muscle at his last visit because he's been down with his back all weekend. Rt lower back 8/10 pain.  Eval:  Pt reports having a couple of back surgeries a couple of years ago, one major decompression surgery and the other a revision. Pt reports being out of work since March the 15th and his retirement date was yesterday. 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 Pt also reports being so exhausted from work that he did not have any energy to do anything after work. Pt  reports pain rating of 6/10 after walking for extended distances but tries to walk a mile everyday. He reports still being numb in R butt cheek. Can not stay in one position for longer than an hour and reports being very stiff in the morning time. Pt reports wanting to get RLE stronger as it is visibly smaller and wants to be able to get his stamina back to be able to walk with his wife.  PERTINENT HISTORY:  -2 back surgery's -shoulder surgery left, 2014 -out of work from March 15th, retirement date 12/28/23  PAIN:  Are you having pain? Yes: NPRS scale: 8/10 Pain location: right hip, back, right side Pain description: ache, sudden move brings on sharp pain, dull Aggravating factors: worse in the morning, prolonged sitting standing, walking, constant bending, stooping, twisting, jarring in the car Relieving factors: get up and get going, 2 tylenol    PRECAUTIONS: None  RED FLAGS: Bowel or bladder incontinence: Yes: decreased stream   Slow gain in weight, accounts it to inability  WEIGHT BEARING RESTRICTIONS: No  FALLS:  Has patient fallen in last 6 months? No   OCCUPATION: janitor  PLOF: Independent and Needs assistance with ADLs, wife to help with putting belt on and putting socks and shoes on   PATIENT GOALS: like to be able to get stronger in legs and hips, get back to jogging, better endurance  NEXT MD VISIT: after therapy  OBJECTIVE:  Note: Objective measures were completed at Evaluation unless otherwise noted.  DIAGNOSTIC FINDINGS:  AP lateral lumbar images are obtained and reviewed this shows lumbar  decompression L3-4 L4-5.  There is to space narrowing at L3-4 L4-5 and  left at L5-S1.  Straightening of the lumbar spine no anterolisthesis.   Laminectomy defect noted.  Hips and sacroiliac joints appear normal.   Impression post decompression L3-4 L4-5.  Disc base narrowing at those  levels has progressed from 06/16/2021 MRI scan.   PATIENT SURVEYS:  Modified Oswestry  20/50   COGNITION: Overall cognitive status: Within functional limits for tasks assessed     SENSATION: Light touch: Impaired  Right LE decreased sensation compared to left, thigh to foot   POSTURE: rounded shoulders and decreased lumbar lordosis  PALPATION: Popping with lumbar segement  LUMBAR ROM:   AROM eval  Flexion 75 degrees, pain  Extension 10 pain  Right lateral flexion 15 degrees, pain at end ROM  Left lateral flexion 10 degrees, fingers almost to knee  Right rotation 50%  Left rotation 75%   (Blank rows = not tested)  LOWER EXTREMITY ROM:     Active  Right eval Left eval Right 01/04/24 01/04/24  Hip flexion      Hip extension [facility]    Hip abduction  [facility] WFL    Hip adduction      Hip internal rotation      Hip external rotation      Knee flexion [facility]    Knee extension [facility]    Ankle dorsiflexion   5 degree 3  Ankle plantarflexion      Ankle inversion      Ankle eversion       (Blank rows = not tested)  LOWER EXTREMITY MMT:    MMT Right eval Left eval Right 01/04/24 Left 01/04/24  Hip flexion 4- 4    Hip extension 4, pain in back 4+    Hip abduction 4- 4    Hip adduction 4 4    Hip internal rotation      Hip external rotation      Knee flexion 3 4    Knee extension   4+ 4+  Ankle dorsiflexion   2+ 2+  Ankle plantarflexion      Ankle inversion      Ankle eversion       (Blank rows = not tested)  LUMBAR SPECIAL TESTS:  Straight leg raise test: Positive Left 55 degrees;  Negative left 60 degrees and Slump test: Negative  FUNCTIONAL TESTS:  5 times sit to stand: 16.8 2 minute walk test: 292 feet  GAIT: Distance walked: 292 Assistive device utilized: None Level of assistance: Complete Independence Comments: some back pain, decreased speed, decreased stride length  TREATMENT DATE:  02/02/2024  Therapeutic Exercise: -Single knee to chest, 2 sets 30 seconds, pt cued for pain free ROM -Supine bridges 2 sets of 10 reps, 3 second holds, pt  cued for max hip extension -Lateral stepping 3 laps 20 feet per lap, second 2 with mini squat GTB around ankles, pt cued for upright posture and core activation. -Monster walk, 2 laps of 20 feet, pt cued for upright posture -Forward lunges on bosu, no UE support, 1 set of 5 reps better performance going into RLE, pt cued for core activation and upright posture -LTR, 2 set of 10 reps bilaterally, pt cued to remain in pain free ROM  Therapeutic Activity: -Squat to chair with tidal tank, 2 sets of 8 reps, pt cued for core activation -Tidal tank carry 1 lap, 70 feet, marching walk, pr cued for core activation -10# KB carry, unilateral for core stabilization, 2 laps each arm, 80 feet per lap, pt cued for level shoulders and core activation    01/31/24 Nustep seat level 2 UE/LE [place] 6 minutes Standing:  squats 2X10  Lunges onto BOSU  no UE support 2X10 each LE  Standing extension 10X  Standing extension against // bars 10X   Lateral stepping  BTB around knees 10 ft line 5RT  Hamstring stretch 12" step 3X30" each LE   01/26/2024  Therapeutic Exercise: -DKTC, 2 sets of 15 reps, pt cued for pain free ROM -Supine bridges 2 sets of 10 reps, 3 second holds, GTB at knees, pt cued for max hip extension -Lateral stepping 3 laps 20 feet per lap, second 2 with RTB around ankles, pt cued for upright posture -Forward lunges on bosu, no UE support, 1 set of 5 reps better performance going into RLE, pt cued for core activation and upright posture -LTR on Green exercise ball, 2 set of 10 reps bilaterally, pt cued to remain in pain free ROM  Therapeutic Activity: -Squat to chair with tidal tank, 2 sets of 10 reps, pt cued for core activation -Tidal  tank carry 1 lap, 70 feet, marching walk, pr cued for core activation -Sled push/pull 3 laps, 60 feet per lap 40lbs, pt cued for core activation                                                                                                                                  PATIENT EDUCATION:  Education details: Pt was educated on findings of PT evaluation, prognosis, frequency of therapy visits and rationale, attendance policy, and HEP if given.   Person educated: Patient Education method: Explanation and Verbal cues Education comprehension: verbalized understanding and needs further education  HOME EXERCISE PROGRAM: Squats to chair, 3 sets of 10 daily Walk 15-30 minutes per day 5/7 days per week  Access Code: [email or web address] URL: [email or web address] Date: 01/13/2024 Prepared by: Irene Mannheim  Exercises - Squat with Chair Touch  - 2 x daily - 7 x weekly - 2 sets - 10 reps - 3" hold - Hooklying Single Knee to Chest Stretch with Towel  - 2 x daily - 7 x weekly - 1 sets - 3 reps - 30 hold - Supine Hamstring Stretch with Strap  - 2 x daily - 7 x weekly - 1 sets - 3 reps - 30" hold - Standing Tandem Balance with Counter Support  - 1 x daily - 7 x weekly - 3 sets - 10 reps - Clamshell with Resistance  - 1 x daily - 7 x weekly - 3 sets - 10 reps - Side Stepping with Resistance at Thighs  - 1 x daily - 7 x weekly - 3 sets - 3-5 reps - 30 hold - Heel Toe Raises with Counter Support  - 1 x daily - 7 x weekly - 3 sets - 10 reps  Date: 01/31/2024 Prepared by: Vickii Grand Exercises - Standing Hamstring Stretch on Chair  - 2 x daily - 7 x weekly - 1 sets - 3 reps - 30 sec hold - Standing Gastroc Stretch on Step with Counter Support  - 2 x daily - 7 x weekly - 1 sets - 3 reps - 30 sec hold   ASSESSMENT:  CLINICAL IMPRESSION: Patient continues to demonstrate low back pain, decreased LE strength, decreased gait quality and balance. Session was regressed after pt states last weeks sessions caused him to tighten up and take this weekend off. Patient does demonstrates increased endurance during today's session with only 2 designated rest breaks taken. Patient able to progress dynamic balance and core activation exercises today with tidal  tank variations of walking and STS functional transfer, good performance with verbal cueing. Patient would continue to benefit from skilled physical therapy for increased endurance with ambulation, increased LE strength, and improved balance for improved quality of life, improved independence with completion of ADLs and continued progress towards therapy goals.     OBJECTIVE IMPAIRMENTS: Abnormal gait, decreased activity tolerance, decreased endurance, decreased mobility, difficulty walking, decreased ROM, decreased strength, impaired  sensation, and pain.   ACTIVITY LIMITATIONS: carrying, lifting, bending, sitting, squatting, stairs, bed mobility, and locomotion level  PARTICIPATION LIMITATIONS: meal prep, cleaning, laundry, shopping, community activity, occupation, and yard work  PERSONAL FACTORS: Fitness, Past/current experiences, and Time since onset of injury/illness/exacerbation are also affecting patient's functional outcome.   REHAB POTENTIAL: Good  CLINICAL DECISION MAKING: Stable/uncomplicated  EVALUATION COMPLEXITY: Low   GOALS: Goals reviewed with patient? No  SHORT TERM GOALS: Target date: 01/19/24  Pt will be independent with HEP in order to demonstrate participation in Physical Therapy POC.  Baseline: Goal status: INITIAL  2.  Pt will report 4/10 pain at worst with mobility in order to demonstrate improved pain with ADLs.  Baseline: 6/10 Goal status: INITIAL  LONG TERM GOALS: Target date: 02/09/24  Pt will improve 5TSTS by at least 2.3 seconds in order to demonstrate improved functional strength to return to desired activities.  Baseline: see objective.  Goal status: INITIAL  2.  Pt will improve 2 MWT by 140 feet in order to demonstrate improved functional ambulatory capacity in community setting.  Baseline: see objective.  Goal status: INITIAL  3.  Pt will improve Modified Oswestry score by at least 6 points in order to demonstrate improved pain with  functional goals and outcomes. Baseline: see objective.  Goal status: INITIAL  4.  Pt will report 2/10 pain with mobility in order to demonstrate reduced pain with ADLs lasting greater than 30 minutes.  Baseline: see objective.  Goal status: INITIAL   PLAN:  PT FREQUENCY: 2x/week  PT DURATION: 6 weeks  PLANNED INTERVENTIONS: 97110-Therapeutic exercises, 97530- Therapeutic activity, [id number]- Neuromuscular re-education, 97535- Self Care, [id number]- Manual therapy, [id number]- Gait training, Spinal mobilization, Cryotherapy, and Moist heat.  PLAN FOR NEXT SESSION: continue with focus on LE strengthening and and core stability.      Armond Bertin, PT, DPT [facility] Office: [phone] 11:48 AM, 02/02/24

## 2024-02-03 NOTE — Telephone Encounter (Signed)
Attempted to call pt, unable to leave message due to mailbox being full.

## 2024-02-07 ENCOUNTER — Encounter: Payer: Self-pay | Admitting: *Deleted

## 2024-02-07 ENCOUNTER — Encounter (HOSPITAL_COMMUNITY)

## 2024-02-07 NOTE — Telephone Encounter (Signed)
 Attempted to call pt, unable to leave message due to mailbox being full. Letter mailed.

## 2024-02-09 ENCOUNTER — Ambulatory Visit (HOSPITAL_COMMUNITY): Admitting: Physical Therapy

## 2024-02-09 ENCOUNTER — Ambulatory Visit: Attending: Internal Medicine | Admitting: *Deleted

## 2024-02-09 DIAGNOSIS — I82401 Acute embolism and thrombosis of unspecified deep veins of right lower extremity: Secondary | ICD-10-CM | POA: Diagnosis not present

## 2024-02-09 DIAGNOSIS — M545 Low back pain, unspecified: Secondary | ICD-10-CM

## 2024-02-09 DIAGNOSIS — M6281 Muscle weakness (generalized): Secondary | ICD-10-CM | POA: Diagnosis not present

## 2024-02-09 DIAGNOSIS — R262 Difficulty in walking, not elsewhere classified: Secondary | ICD-10-CM | POA: Diagnosis not present

## 2024-02-09 DIAGNOSIS — Z7901 Long term (current) use of anticoagulants: Secondary | ICD-10-CM

## 2024-02-09 LAB — POCT INR: INR: 3.6 — AB (ref 2.0–3.0)

## 2024-02-09 NOTE — Therapy (Signed)
 OUTPATIENT PHYSICAL THERAPY THORACOLUMBAR TREATMENT Progress Note Reporting Period 12/29/23 to 02/09/24  See note below for Objective Data and Assessment of Progress/Goals.       Patient Name: Brandon Burton MRN: [id number] DOB:06/18/1966, 58 y.o., male Today's Date: 02/10/2024  END OF SESSION:  PT End of Session - 02/09/24 1105     Visit Number 8    Number of Visits 12    Date for PT Re-Evaluation 03/22/24    Authorization Type [employer] COMM PPO    Authorization Time Period no auth needed    Progress Note Due on Visit 10    PT Start Time 1105    PT Stop Time 1145    PT Time Calculation (min) 40 min    Activity Tolerance Patient tolerated treatment well;Patient limited by pain    Behavior During Therapy WFL for tasks assessed/performed                  Past Medical History:  Diagnosis Date   DVT (deep venous thrombosis) (HCC)    GERD (gastroesophageal reflux disease)    Hypertension    Past Surgical History:  Procedure Laterality Date   COLONOSCOPY N/A 07/22/2015   SLF:7 polyps removed/mild diverticulosis/moderate internal hemorrhoids   COLONOSCOPY N/A 11/18/2018   Diverticulosis, hemorrhoids, otherwise normal. 2025 due.   EYE SURGERY     LUMBAR LAMINECTOMY N/A 12/10/2021   Procedure: LUMBAR RE-EXPLORATION;  Surgeon: Adah Acron, MD;  Location: [facility] OR;  Service: Orthopedics;  Laterality: N/A;   LUMBAR LAMINECTOMY/DECOMPRESSION MICRODISCECTOMY N/A 11/26/2021   Procedure: Lumbar three-four, Lumbar four-five Central Decompression, Lumbar Three-Four Microdiscectomhy;  Surgeon: Adah Acron, MD;  Location: [facility] OR;  Service: Orthopedics;  Laterality: N/A;   SHOULDER SURGERY Left 09/28/2013   fix previous clavicular fracture complication   Patient Active Problem List   Diagnosis Date Noted   Disc degeneration, lumbar 12/01/2023   Osteoarthritis 10/08/2023   Acute venous embolism and thrombosis of deep vessels of proximal lower extremity (HCC) 02/15/2023   Long term  (current) use of anticoagulants 01/27/2023   HTN (hypertension) 01/27/2023   Surgical site infection 12/23/2021   Medication monitoring encounter 12/23/2021   DVT (deep venous thrombosis) (HCC) 12/17/2021   Wound drainage 12/10/2021   Status post lumbar spine surgery for decompression of spinal cord 12/09/2021   Postoperative CSF leak 12/09/2021   Lumbar stenosis 11/26/2021   Spinal stenosis of lumbar region 07/04/2021   History of colonic polyps 08/16/2018   GERD (gastroesophageal reflux disease) 07/08/2015   Rectal bleeding 07/08/2015   Sprain of ankle 09/10/2009   CLOSED FRACTURE OF CALCANEUS 08/27/2009   SHOULDER STRAIN 02/27/2009    PCP: Artemisa Bile, MD   REFERRING PROVIDER: Adah Acron, MD  REFERRING DIAG: M54.50,G89.29 (ICD-10-CM) - Chronic bilateral low back pain without sciatica  Rationale for Evaluation and Treatment: Rehabilitation  THERAPY DIAG:  Low back pain, unspecified back pain laterality, unspecified chronicity, unspecified whether sciatica present  Difficulty in walking, not elsewhere classified  Muscle weakness (generalized)  ONSET DATE: 03/23 pt reports one major back surgery and then revision 2 weeks later  SUBJECTIVE:  SUBJECTIVE STATEMENT: Pt reports pain varies from being 3/10 to pain going up to 6/7.  States depends on his activity level as this increases his pain.  States he still gets some numbness in the Rt toes/posterior LE all the time but worsens with increased walking/activity. Feels like he has improved some with therapy.  Pt states he must have pulled a muscle at his last visit because he's been down with his back all weekend. Rt lower back 8/10 pain.  Eval:  Pt reports having a couple of back surgeries a couple of years ago, one major decompression  surgery and the other a revision. Pt reports being out of work since March the 15th and his retirement date was yesterday. 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 Pt also reports being so exhausted from work that he did not have any energy to do anything after work. Pt reports pain rating of 6/10 after walking for extended distances but tries to walk a mile everyday. He reports still being numb in R butt cheek. Can not stay in one position for longer than an hour and reports being very stiff in the morning time. Pt reports wanting to get RLE stronger as it is visibly smaller and wants to be able to get his stamina back to be able to walk with his wife.  PERTINENT HISTORY:  -2 back surgery's -shoulder surgery left, 2014 -out of work from March 15th, retirement date 12/28/23  PAIN:  Are you having pain? Yes: NPRS scale: 6/10 Pain location: right hip, back, right side Pain description: ache, sudden move brings on sharp pain, dull Aggravating factors: worse in the morning, prolonged sitting standing, walking, constant bending, stooping, twisting, jarring in the car Relieving factors: get up and get going, 2 tylenol    PRECAUTIONS: None  RED FLAGS: Bowel or bladder incontinence: Yes: decreased stream  Slow gain in weight, accounts it to inability  WEIGHT BEARING RESTRICTIONS: No  FALLS:  Has patient fallen in last 6 months? No   OCCUPATION: janitor  PLOF: Independent and Needs assistance with ADLs, wife to help with putting belt on and putting socks and shoes on   PATIENT GOALS: like to be able to get stronger in legs and hips, get back to jogging, better endurance  NEXT MD VISIT: after therapy  OBJECTIVE:  Note: Objective measures were completed at Evaluation unless otherwise noted.  DIAGNOSTIC FINDINGS:  AP lateral lumbar images are obtained and reviewed this shows lumbar  decompression L3-4 L4-5.  There is to space narrowing at L3-4  L4-5 and  left at L5-S1.  Straightening of the lumbar spine no anterolisthesis.   Laminectomy defect noted.  Hips and sacroiliac joints appear normal.   Impression post decompression L3-4 L4-5.  Disc base narrowing at those  levels has progressed from 06/16/2021 MRI scan.   PATIENT SURVEYS:  Modified Oswestry 20/50   COGNITION: Overall cognitive status: Within functional limits for tasks assessed     SENSATION: Light touch: Impaired  Right LE decreased sensation compared to left, thigh to foot   POSTURE: rounded shoulders and decreased lumbar lordosis  PALPATION: Popping with lumbar segement  LUMBAR ROM:   AROM eval  Flexion 75 degrees, pain  Extension 10 pain  Right lateral flexion 15 degrees, pain at end ROM  Left lateral flexion 10 degrees, fingers almost to knee  Right rotation 50%  Left rotation 75%   (Blank rows = not tested)  LOWER EXTREMITY ROM:     Active  Right eval Left eval Right 01/04/24 01/04/24 02/09/24 Right 02/09/24 left  Hip flexion        Hip extension [facility]      Hip abduction [facility]      Hip adduction        Hip internal rotation        Hip external rotation        Knee flexion [facility]      Knee extension [facility]      Ankle dorsiflexion   5 degree 3 10 10   Ankle plantarflexion        Ankle inversion        Ankle eversion         (Blank rows = not tested)  LOWER EXTREMITY MMT:    MMT Right eval Left eval Right 01/04/24 Left 01/04/24 Right 02/09/24 Left 02/09/24  Hip flexion 4- 4   4+ 4+  Hip extension 4, pain in back 4+      Hip abduction 4- 4   3+ 3+  Hip adduction 4 4   3- 3-  Hip internal rotation        Hip external rotation        Knee flexion 3 4   3+ 3+  Knee extension   4+ 4+ 3+ 3+  Ankle dorsiflexion   2+ 2+ 4 4  Ankle plantarflexion        Ankle inversion        Ankle eversion         (Blank rows = not tested)  LUMBAR SPECIAL TESTS: \  Straight leg raise test: Positive Left 55 degrees;  Negative left 60 degrees and  Slump test: Negative SLR test 02/09/24:  Rt: 78 degrees, Lt: 70 degrees  FUNCTIONAL TESTS:  02/09/24: 5X STS test: 9.15 sec (was 16.8) 2 MWT: 325 feet without AD (was 292)   Evaluation: 5 times sit to stand: 16.8 2 minute walk test: 292 feet  GAIT: Distance walked: 292 Assistive device utilized: None Level of assistance: Complete Independence Comments: some back pain, decreased speed, decreased stride length  TREATMENT DATE:  02/09/24 Progress note completed Functional tests 5X STS test: 9.15 sec (was 16.8) 2 MWT: 325 feet without AD (was 292)  SLR test 02/09/24:  Rt: 78 degrees (was 60), Lt: 70 degrees (was 55) Modified Oswestry 20/50 (was 20/50) MMT (see above) ROM testing (see above)  Modified Oswestry Low Back Pain Disability Questionnaire Summary Date Completed:02/09/2024 11:37:22 AM Pain Intensity Medications provide moderate relief(3 points) Personal Care (e.g., Washing, Dressing)I can take care of myself normally it increases my pain(1 points) LiftingI can lift heavy weights if they are conveniently positioned(2 points) WalkingPain prevents me from walking more than 1 mile(1 points) Sitting: pain prevents me from sitting for more than 1 hour(2 points) Standing I can stand as long as I want with increased pain(1 points) Sleeping Even with pain medication, I sleep less than 4 hours(3 points) Social LifePain prevents from doing energetic activities(2 points) TravelingPain restricts travel over 2 hours(2 points) Employment/HomemakingPain prevents anything but light duties(3 points) Pertinent Negative Pertinent Positive Pertinent Positive Modified Oswestry Low Back Pain Disability Questionnaire:20 points or 40 percent.  02/02/2024  Therapeutic Exercise: -Single knee to chest, 2 sets 30 seconds, pt cued for pain free ROM -Supine bridges 2 sets of 10 reps, 3 second holds, pt cued for max hip extension -Lateral stepping 3 laps 20 feet per lap, second 2  with mini squat GTB  around ankles, pt cued for upright posture and core activation. -Monster walk, 2 laps of 20 feet, pt cued for upright posture -Forward lunges on bosu, no UE support, 1 set of 5 reps better performance going into RLE, pt cued for core activation and upright posture -LTR, 2 set of 10 reps bilaterally, pt cued to remain in pain free ROM  Therapeutic Activity: -Squat to chair with tidal tank, 2 sets of 8 reps, pt cued for core activation -Tidal tank carry 1 lap, 70 feet, marching walk, pr cued for core activation -10# KB carry, unilateral for core stabilization, 2 laps each arm, 80 feet per lap, pt cued for level shoulders and core activation    01/31/24 Nustep seat level 2 UE/LE [place] 6 minutes Standing:  squats 2X10  Lunges onto BOSU  no UE support 2X10 each LE  Standing extension 10X  Standing extension against // bars 10X   Lateral stepping  BTB around knees 10 ft line 5RT  Hamstring stretch 12" step 3X30" each LE   01/26/2024  Therapeutic Exercise: -DKTC, 2 sets of 15 reps, pt cued for pain free ROM -Supine bridges 2 sets of 10 reps, 3 second holds, GTB at knees, pt cued for max hip extension -Lateral stepping 3 laps 20 feet per lap, second 2 with RTB around ankles, pt cued for upright posture -Forward lunges on bosu, no UE support, 1 set of 5 reps better performance going into RLE, pt cued for core activation and upright posture -LTR on Green exercise ball, 2 set of 10 reps bilaterally, pt cued to remain in pain free ROM  Therapeutic Activity: -Squat to chair with tidal tank, 2 sets of 10 reps, pt cued for core activation -Tidal tank carry 1 lap, 70 feet, marching walk, pr cued for core activation -Sled push/pull 3 laps, 60 feet per lap 40lbs, pt cued for core activation                                                                                                                                 PATIENT EDUCATION:  Education details: Pt was educated on findings of PT  evaluation, prognosis, frequency of therapy visits and rationale, attendance policy, and HEP if given.   Person educated: Patient Education method: Explanation and Verbal cues Education comprehension: verbalized understanding and needs further education  HOME EXERCISE PROGRAM: Squats to chair, 3 sets of 10 daily Walk 15-30 minutes per day 5/7 days per week  Access Code: [email or web address] URL: [email or web address] Date: 01/13/2024 Prepared by: Irene Mannheim  Exercises - Squat with Chair Touch  - 2 x daily - 7 x weekly - 2 sets - 10 reps - 3" hold - Hooklying Single Knee to Chest Stretch with Towel  - 2 x daily - 7 x weekly - 1 sets - 3 reps - 30 hold - Supine Hamstring Stretch with Strap  - 2 x daily -  7 x weekly - 1 sets - 3 reps - 30" hold - Standing Tandem Balance with Counter Support  - 1 x daily - 7 x weekly - 3 sets - 10 reps - Clamshell with Resistance  - 1 x daily - 7 x weekly - 3 sets - 10 reps - Side Stepping with Resistance at Thighs  - 1 x daily - 7 x weekly - 3 sets - 3-5 reps - 30 hold - Heel Toe Raises with Counter Support  - 1 x daily - 7 x weekly - 3 sets - 10 reps  Date: 01/31/2024 Prepared by: Vickii Grand Exercises - Standing Hamstring Stretch on Chair  - 2 x daily - 7 x weekly - 1 sets - 3 reps - 30 sec hold - Standing Gastroc Stretch on Step with Counter Support  - 2 x daily - 7 x weekly - 1 sets - 3 reps - 30 sec hold   ASSESSMENT:  CLINICAL IMPRESSION: Progress note completed this session with noted functional gains and improved ankle ROM as compared to initial evaluation.  Strength testing with mixed results, some ,mm weaker some stronger as compared to initial evaluation.  No change in patients Oswestry score.  Currently pt has met 1 short term and 1 long term goal and is progressing towards meeting remaining goals.  Pt would like to continue therapy at this point to make further improvements.  Pt continues to be most limited by low back pain,  decreased LE strength, decreased gait quality and balance. Additionally, session was regressed the last two weeks due to pt stating an activity completed caused him tighten up and cause increased discomfort.. Patient will continue to benefit from skilled physical therapy for increased endurance with ambulation, increased LE strength, and improved balance for improved quality of life, improved independence with completion of ADLs and continued progress towards therapy goals.   OBJECTIVE IMPAIRMENTS: Abnormal gait, decreased activity tolerance, decreased endurance, decreased mobility, difficulty walking, decreased ROM, decreased strength, impaired sensation, and pain.   ACTIVITY LIMITATIONS: carrying, lifting, bending, sitting, squatting, stairs, bed mobility, and locomotion level  PARTICIPATION LIMITATIONS: meal prep, cleaning, laundry, shopping, community activity, occupation, and yard work  PERSONAL FACTORS: Fitness, Past/current experiences, and Time since onset of injury/illness/exacerbation are also affecting patient's functional outcome.   REHAB POTENTIAL: Good  CLINICAL DECISION MAKING: Stable/uncomplicated  EVALUATION COMPLEXITY: Low   GOALS: Goals reviewed with patient? Yes  SHORT TERM GOALS: Target date: 01/19/24  Pt will be independent with HEP in order to demonstrate participation in Physical Therapy POC.  Baseline: Goal status: MET  2.  Pt will report 4/10 pain at worst with mobility in order to demonstrate improved pain with ADLs.  Baseline: 6/10 Goal status: NOT MET (still 6-7)  LONG TERM GOALS: Target date: 02/09/24  Pt will improve 5TSTS by at least 2.3 seconds in order to demonstrate improved functional strength to return to desired activities.  Baseline: see objective.  Goal status: MET  2.  Pt will improve 2 MWT by 140 feet in order to demonstrate improved functional ambulatory capacity in community setting.  Baseline: see objective.  Goal status: NOT MET  (went 32 feet more than initial evaluation)  3.  Pt will improve Modified Oswestry score by at least 6 points in order to demonstrate improved pain with functional goals and outcomes. Baseline: see objective.  Goal status: NOT MET  4.  Pt will report 2/10 pain with mobility in order to demonstrate reduced pain with ADLs lasting greater  than 30 minutes.  Baseline: see objective.  Goal status: NOT MET   PLAN:  PT FREQUENCY: 2x/week  PT DURATION: 6 weeks  PLANNED INTERVENTIONS: 97110-Therapeutic exercises, 97530- Therapeutic activity, [id number]- Neuromuscular re-education, 97535- Self Care, 86578- Manual therapy, [id number]- Gait training, Spinal mobilization, Cryotherapy, and Moist heat.  PLAN FOR NEXT SESSION: continue with focus on LE strengthening and and core stability.      Lorenso Romance, PTA/CLT [facility] Ph: [phone] 8:14 AM, 02/10/24  Seeking 2 visits per week for the next 6 weeks to get him closer to net follow up appointment.  Armond Bertin, PT, DPT [facility] Office: [phone] 8:14 AM, 02/10/24

## 2024-02-09 NOTE — Patient Instructions (Signed)
 Hold warfarin tonight then resume 1 tablet daily. Pending colonoscopy.  Date TBD  (June)  Will hold warfarin 5 days before procedure and bridge with Lovenox. Recheck in 3 wks

## 2024-02-10 NOTE — Addendum Note (Signed)
 Addended by: Reyden Smith on: 02/10/2024 08:19 AM   Modules accepted: Orders

## 2024-02-15 ENCOUNTER — Encounter (HOSPITAL_COMMUNITY)

## 2024-02-15 ENCOUNTER — Encounter (HOSPITAL_COMMUNITY): Payer: Self-pay

## 2024-02-15 NOTE — Therapy (Signed)
 [facility] Outpatient Rehabilitation at [facility] [address] [place], [place], [address] Phone: [phone]   Fax:  [phone]  Patient Details  Name: Brandon Burton MRN: [id number] Date of Birth: [birth year] Referring Provider:  No ref. provider found  Encounter Date: 02/15/2024  Pt was called concerning his missed 11:00 AM appointment this morning. Pts mailbox is full at this time and unable to leave message. We will hope to see [name] at his next scheduled time.   Armond Bertin, PT, DPT [facility] Office: [phone] 11:36 AM, 02/15/24   [facility] Outpatient Rehabilitation at [facility] [address] [place], [place], [address] Phone: [phone]   Fax:  [phone]

## 2024-02-15 NOTE — Therapy (Deleted)
 [facility] Outpatient Rehabilitation at [facility] [address] [place], [place], [address] Phone: [phone]   Fax:  [phone]  Patient Details  Name: Brandon Burton MRN: [id number] Date of Birth: Jul 20, 1966 Referring Provider:  Artemisa Bile, MD  Encounter Date: 02/15/2024  Pt was called concerning his missed 11:00 AM appointment this morning. Pts mailbox is full at this time and unable to leave message. We will hope to see Mr. Stitzer at his next scheduled time.  Armond Bertin, PT, DPT [facility] Office: [phone] 11:35 AM, 02/15/24   [facility] Outpatient Rehabilitation at [facility] [address] [place], [place], [address] Phone: [phone]   Fax:  [phone]

## 2024-02-17 ENCOUNTER — Encounter (HOSPITAL_COMMUNITY): Admitting: Physical Therapy

## 2024-02-22 ENCOUNTER — Encounter (HOSPITAL_COMMUNITY)

## 2024-02-22 ENCOUNTER — Other Ambulatory Visit: Payer: Self-pay | Admitting: *Deleted

## 2024-02-22 ENCOUNTER — Encounter: Payer: Self-pay | Admitting: *Deleted

## 2024-02-22 MED ORDER — PEG 3350-KCL-NA BICARB-NACL 420 G PO SOLR
4000.0000 mL | Freq: Once | ORAL | 0 refills | Status: AC
Start: 2024-02-22 — End: 2024-02-22

## 2024-02-22 NOTE — Telephone Encounter (Signed)
 Correction prep has been sent to the pharmacy

## 2024-02-22 NOTE — Telephone Encounter (Signed)
 Pt scheduled for 03/13/24, instructions mailed and prep sent to the office.

## 2024-02-24 ENCOUNTER — Encounter (HOSPITAL_COMMUNITY)

## 2024-03-01 ENCOUNTER — Ambulatory Visit: Attending: Internal Medicine | Admitting: *Deleted

## 2024-03-01 DIAGNOSIS — I82401 Acute embolism and thrombosis of unspecified deep veins of right lower extremity: Secondary | ICD-10-CM

## 2024-03-01 DIAGNOSIS — Z7901 Long term (current) use of anticoagulants: Secondary | ICD-10-CM

## 2024-03-01 LAB — POCT INR: INR: 4.1 — AB (ref 2.0–3.0)

## 2024-03-01 NOTE — Patient Instructions (Signed)
 Hold warfarin tonight then decrease dose to 1 tablet daily except 1/2 tablet on Thursdays. Pending colonoscopy on 03/13/24.  Will hold warfarin 5 days before procedure and bridge with Lovenox. Recheck in 1 wk

## 2024-03-06 NOTE — Patient Instructions (Signed)
 Brandon Burton  03/06/2024     @PREFPERIOPPHARMACY @   Your procedure is scheduled on  03/13/2024.   Report to [facility] at  0930 A.M.   Call this number if you have problems the morning of surgery:  [phone]  If you experience any cold or flu symptoms such as cough, fever, chills, shortness of breath, etc. between now and your scheduled surgery, please notify us  at the above number.   Remember:        Your last dose of coumadin  should be 03/07/2024. Follow the lovenox instructions given to you by the coumadin  clinic.    Follow the diet and prep instructions given to you by the office.   You may drink clear liquids until 0730 am on 03/13/2024.    Clear liquids allowed are:                    Water , Juice (No red color; non-citric and without pulp; diabetics please choose diet or no sugar options), Carbonated beverages (diabetics please choose diet or no sugar options), Clear Tea (No creamer, milk, or cream, including half & half and powdered creamer), Black Coffee Only (No creamer, milk or cream, including half & half and powdered creamer), and Clear Sports drink (No red color; diabetics please choose diet or no sugar options)    Take these medicines the morning of surgery with A SIP OF WATER                                         amlodipine , omeprazole .    Do not wear jewelry, make-up or nail polish, including gel polish,  artificial nails, or any other type of covering on natural nails (fingers and  toes).  Do not wear lotions, powders, or perfumes, or deodorant.  Do not shave 48 hours prior to surgery.  Men may shave face and neck.  Do not bring valuables to the hospital.  [facility] is not responsible for any belongings or valuables.  Contacts, dentures or bridgework may not be worn into surgery.  Leave your suitcase in the car.  After surgery it may be brought to your room.  For patients admitted to the hospital, discharge time will be determined by your  treatment team.  Patients discharged the day of surgery will not be allowed to drive home and must have someone with them for 24 hours.    Special instructions:   DO NOT smoke tobacco or vape for 24 hours before your procedure.  Please read over the following fact sheets that you were given. Anesthesia Post-op Instructions and Care and Recovery After Surgery      Colonoscopy, Adult, Care After The following information offers guidance on how to care for yourself after your procedure. Your health care provider may also give you more specific instructions. If you have problems or questions, contact your health care provider. What can I expect after the procedure? After the procedure, it is common to have: A small amount of blood in your stool for 24 hours after the procedure. Some gas. Mild cramping or bloating of your abdomen. Follow these instructions at home: Eating and drinking  Drink enough fluid to keep your urine pale yellow. Follow instructions from your health care provider about eating or drinking restrictions. Resume your normal diet as told by your health care provider. Avoid heavy or fried  foods that are hard to digest. Activity Rest as told by your health care provider. Avoid sitting for a long time without moving. Get up to take short walks every 1-2 hours. This is important to improve blood flow and breathing. Ask for help if you feel weak or unsteady. Return to your normal activities as told by your health care provider. Ask your health care provider what activities are safe for you. Managing cramping and bloating  Try walking around when you have cramps or feel bloated. If directed, apply heat to your abdomen as told by your health care provider. Use the heat source that your health care provider recommends, such as a moist heat pack or a heating pad. Place a towel between your skin and the heat source. Leave the heat on for 20-30 minutes. Remove the heat if your  skin turns bright red. This is especially important if you are unable to feel pain, heat, or cold. You have a greater risk of getting burned. General instructions If you were given a sedative during the procedure, it can affect you for several hours. Do not drive or operate machinery until your health care provider says that it is safe. For the first 24 hours after the procedure: Do not sign important documents. Do not drink alcohol. Do your regular daily activities at a slower pace than normal. Eat soft foods that are easy to digest. Take over-the-counter and prescription medicines only as told by your health care provider. Keep all follow-up visits. This is important. Contact a health care provider if: You have blood in your stool 2-3 days after the procedure. Get help right away if: You have more than a small spotting of blood in your stool. You have large blood clots in your stool. You have swelling of your abdomen. You have nausea or vomiting. You have a fever. You have increasing pain in your abdomen that is not relieved with medicine. These symptoms may be an emergency. Get help right away. Call 911. Do not wait to see if the symptoms will go away. Do not drive yourself to the hospital. Summary After the procedure, it is common to have a small amount of blood in your stool. You may also have mild cramping and bloating of your abdomen. If you were given a sedative during the procedure, it can affect you for several hours. Do not drive or operate machinery until your health care provider says that it is safe. Get help right away if you have a lot of blood in your stool, nausea or vomiting, a fever, or increased pain in your abdomen. This information is not intended to replace advice given to you by your health care provider. Make sure you discuss any questions you have with your health care provider. Document Revised: 10/27/2022 Document Reviewed: 05/07/2021 Elsevier Patient  Education  2024 [employer].General Anesthesia, Adult, Care After The following information offers guidance on how to care for yourself after your procedure. Your health care provider may also give you more specific instructions. If you have problems or questions, contact your health care provider. What can I expect after the procedure? After the procedure, it is common for people to: Have pain or discomfort at the IV site. Have nausea or vomiting. Have a sore throat or hoarseness. Have trouble concentrating. Feel cold or chills. Feel weak, sleepy, or tired (fatigue). Have soreness and body aches. These can affect parts of the body that were not involved in surgery. Follow these instructions at home: For  the time period you were told by your health care provider:  Rest. Do not participate in activities where you could fall or become injured. Do not drive or use machinery. Do not drink alcohol. Do not take sleeping pills or medicines that cause drowsiness. Do not make important decisions or sign legal documents. Do not take care of children on your own. General instructions Drink enough fluid to keep your urine pale yellow. If you have sleep apnea, surgery and certain medicines can increase your risk for breathing problems. Follow instructions from your health care provider about wearing your sleep device: Anytime you are sleeping, including during daytime naps. While taking prescription pain medicines, sleeping medicines, or medicines that make you drowsy. Return to your normal activities as told by your health care provider. Ask your health care provider what activities are safe for you. Take over-the-counter and prescription medicines only as told by your health care provider. Do not use any products that contain nicotine or tobacco. These products include cigarettes, chewing tobacco, and vaping devices, such as e-cigarettes. These can delay incision healing after surgery. If you need  help quitting, ask your health care provider. Contact a health care provider if: You have nausea or vomiting that does not get better with medicine. You vomit every time you eat or drink. You have pain that does not get better with medicine. You cannot urinate or have bloody urine. You develop a skin rash. You have a fever. Get help right away if: You have trouble breathing. You have chest pain. You vomit blood. These symptoms may be an emergency. Get help right away. Call 911. Do not wait to see if the symptoms will go away. Do not drive yourself to the hospital. Summary After the procedure, it is common to have a sore throat, hoarseness, nausea, vomiting, or to feel weak, sleepy, or fatigue. For the time period you were told by your health care provider, do not drive or use machinery. Get help right away if you have difficulty breathing, have chest pain, or vomit blood. These symptoms may be an emergency. This information is not intended to replace advice given to you by your health care provider. Make sure you discuss any questions you have with your health care provider. Document Revised: 12/12/2021 Document Reviewed: 12/12/2021 Elsevier Patient Education  2024 [employer].

## 2024-03-08 ENCOUNTER — Telehealth: Payer: Self-pay | Admitting: *Deleted

## 2024-03-08 ENCOUNTER — Encounter (HOSPITAL_COMMUNITY): Payer: Self-pay

## 2024-03-08 ENCOUNTER — Ambulatory Visit: Attending: Internal Medicine | Admitting: *Deleted

## 2024-03-08 ENCOUNTER — Encounter (HOSPITAL_COMMUNITY)
Admission: RE | Admit: 2024-03-08 | Discharge: 2024-03-08 | Disposition: A | Discharge status: Home / Self Care | Source: Ambulatory Visit | Attending: Internal Medicine | Admitting: Internal Medicine

## 2024-03-08 VITALS — Ht 72.0 in | Wt 255.5 lb

## 2024-03-08 DIAGNOSIS — E785 Hyperlipidemia, unspecified: Secondary | ICD-10-CM | POA: Diagnosis not present

## 2024-03-08 DIAGNOSIS — I10 Essential (primary) hypertension: Secondary | ICD-10-CM

## 2024-03-08 DIAGNOSIS — I82401 Acute embolism and thrombosis of unspecified deep veins of right lower extremity: Secondary | ICD-10-CM

## 2024-03-08 DIAGNOSIS — Z79899 Other long term (current) drug therapy: Secondary | ICD-10-CM | POA: Diagnosis not present

## 2024-03-08 DIAGNOSIS — Z7901 Long term (current) use of anticoagulants: Secondary | ICD-10-CM | POA: Diagnosis not present

## 2024-03-08 LAB — POCT INR: INR: 2.7 (ref 2.0–3.0)

## 2024-03-08 MED ORDER — ENOXAPARIN SODIUM 120 MG/0.8ML IJ SOSY
120.0000 mg | PREFILLED_SYRINGE | INTRAMUSCULAR | 0 refills | Status: AC
Start: 2024-03-08 — End: ?

## 2024-03-08 NOTE — Telephone Encounter (Signed)
 Received message from endo patient did not show up for pre-op appt today.  Called pt, received VM but VM full and unable to leave a message

## 2024-03-08 NOTE — Patient Instructions (Addendum)
 6/16  Colonoscopy  Labs 12/20/23:  Scr 1.05  CrCl 125.71  Hgb 16.1  Hct 49  Plts 424  Wt 115.9  Lovenox 120mg  daily at 7am  #10 syringes to [facility]  [place]  6/10  Last dose of warfarin 6/11  No Lovenox or warfarin 6/12 - 6/15  Lovenox 120mg  sq at 7am 6/16  NO LOVENOX in am --------procedure -------Warfarin 1.5 tablets in PM 6/17  Lovenox 120mg  at 7am and warfarin 1.5 tablets in PM 6/18  Lovenox 120mg  at 7am and warfarin 1 tablet in PM 6/19  Lovenox 120mg  at 7am and warfarin 0.5 tablet in PM 6/20 - 622  Lovenox 120mg  at 7am and warfarin 1 tablet in PM 6/23  INR appt at 1:45 pm

## 2024-03-09 NOTE — Telephone Encounter (Signed)
Called pt, no answer and not able to leave VM

## 2024-03-10 ENCOUNTER — Encounter (HOSPITAL_COMMUNITY): Payer: Self-pay

## 2024-03-10 ENCOUNTER — Other Ambulatory Visit: Payer: Self-pay

## 2024-03-10 NOTE — Telephone Encounter (Signed)
 Kim from endo stated she had talked with pt's wife and he is aware and will be at procedure on Monday

## 2024-03-10 NOTE — Pre-Procedure Instructions (Signed)
 Attempted pre-op phone call to [phone] full.

## 2024-03-13 ENCOUNTER — Telehealth: Payer: Self-pay | Admitting: *Deleted

## 2024-03-13 ENCOUNTER — Ambulatory Visit (HOSPITAL_COMMUNITY)
Admission: RE | Admit: 2024-03-13 | Discharge: 2024-03-13 | Disposition: A | Discharge status: Home / Self Care | Source: Ambulatory Visit | Attending: Internal Medicine | Admitting: Internal Medicine

## 2024-03-13 ENCOUNTER — Encounter (HOSPITAL_COMMUNITY): Payer: Self-pay | Admitting: Internal Medicine

## 2024-03-13 ENCOUNTER — Encounter (HOSPITAL_COMMUNITY)
Admit: 2024-03-13 | Discharge: 2024-03-13 | Disposition: A | Discharge status: Home / Self Care | Attending: Gastroenterology | Admitting: Gastroenterology

## 2024-03-13 ENCOUNTER — Ambulatory Visit (HOSPITAL_COMMUNITY): Admitting: Anesthesiology

## 2024-03-13 ENCOUNTER — Encounter (HOSPITAL_COMMUNITY)
Admission: RE | Disposition: A | Discharge status: Home / Self Care | Payer: Self-pay | Source: Ambulatory Visit | Attending: Internal Medicine

## 2024-03-13 DIAGNOSIS — I10 Essential (primary) hypertension: Secondary | ICD-10-CM | POA: Diagnosis not present

## 2024-03-13 DIAGNOSIS — Z539 Procedure and treatment not carried out, unspecified reason: Secondary | ICD-10-CM | POA: Insufficient documentation

## 2024-03-13 DIAGNOSIS — F17210 Nicotine dependence, cigarettes, uncomplicated: Secondary | ICD-10-CM | POA: Diagnosis not present

## 2024-03-13 DIAGNOSIS — Z1211 Encounter for screening for malignant neoplasm of colon: Secondary | ICD-10-CM | POA: Diagnosis not present

## 2024-03-13 SURGERY — CANCELLED PROCEDURE
Anesthesia: General

## 2024-03-13 MED ORDER — LACTATED RINGERS IV SOLN: INTRAVENOUS | Status: DC

## 2024-03-13 NOTE — H&P (Signed)
 Patient presented for colonoscopy today due to history of polyps.  Currently on a Lovenox  bridge and unfortunately took full dose Lovenox  this morning.  We will reschedule him for tomorrow morning.  Patient to stay on clear liquids today.  Bottle of mag citrate tonight.  Patient counseled to not take his Lovenox  tomorrow morning.  He is agreeable to plan.  Procedure will be performed by Dr. Sammi Crick who I have updated.  He is also agreeable to plan.

## 2024-03-13 NOTE — Anesthesia Preprocedure Evaluation (Addendum)
 Anesthesia Evaluation  Patient identified by MRN, date of birth, ID band Patient awake    Reviewed: Allergy & Precautions, H&P , NPO status , Patient's Chart, lab work & pertinent test results, reviewed documented beta blocker date and time   Airway Mallampati: IV  TM Distance: >3 FB Neck ROM: full    Dental no notable dental hx.    Pulmonary neg pulmonary ROS, Current Smoker   Pulmonary exam normal breath sounds clear to auscultation       Cardiovascular Exercise Tolerance: Good hypertension, negative cardio ROS  Rhythm:regular Rate:Normal     Neuro/Psych negative neurological ROS  negative psych ROS   GI/Hepatic negative GI ROS, Neg liver ROS,GERD  ,,  Endo/Other  negative endocrine ROS    Renal/GU negative Renal ROS  negative genitourinary   Musculoskeletal   Abdominal   Peds  Hematology negative hematology ROS (+)   Anesthesia Other Findings   Reproductive/Obstetrics negative OB ROS                             Anesthesia Physical Anesthesia Plan  ASA: 3  Anesthesia Plan: General   Post-op Pain Management:    Induction:   PONV Risk Score and Plan: Propofol  infusion  Airway Management Planned:   Additional Equipment:   Intra-op Plan:   Post-operative Plan:   Informed Consent: I have reviewed the patients History and Physical, chart, labs and discussed the procedure including the risks, benefits and alternatives for the proposed anesthesia with the patient or authorized representative who has indicated his/her understanding and acceptance.     Dental Advisory Given  Plan Discussed with: CRNA  Anesthesia Plan Comments:        Anesthesia Quick Evaluation

## 2024-03-13 NOTE — Telephone Encounter (Signed)
 Pt showed up for procedure this morning and he took lovenox . Dr. Mordechai April spoke with Dr. Sammi Crick and he will do procedure tomorrow. Added on schedule.

## 2024-03-13 NOTE — Progress Notes (Signed)
 Patient took his Lovenox  this morning.  Dr. Mordechai April spoke with patient, explained why we couldn't do him today.  Patient placed on schedule for tomorrow, arrival 0730.  Dr. Mordechai April instructed patient not to take his Lovenox  tomorrow and also take a dose of magnesium citrate tonight, do not eat any food.  All of these instructions were given to patient.  Patient left ambulatory to lobby with his wife.

## 2024-03-14 ENCOUNTER — Ambulatory Visit (HOSPITAL_COMMUNITY)
Admission: RE | Admit: 2024-03-14 | Discharge: 2024-03-14 | Disposition: A | Discharge status: Home / Self Care | Source: Ambulatory Visit | Attending: Gastroenterology | Admitting: Gastroenterology

## 2024-03-14 ENCOUNTER — Encounter (HOSPITAL_COMMUNITY): Payer: Self-pay | Admitting: Gastroenterology

## 2024-03-14 ENCOUNTER — Ambulatory Visit (HOSPITAL_COMMUNITY): Admitting: Anesthesiology

## 2024-03-14 ENCOUNTER — Encounter (HOSPITAL_COMMUNITY)
Admission: RE | Disposition: A | Discharge status: Home / Self Care | Payer: Self-pay | Source: Ambulatory Visit | Attending: Gastroenterology

## 2024-03-14 DIAGNOSIS — K648 Other hemorrhoids: Secondary | ICD-10-CM | POA: Insufficient documentation

## 2024-03-14 DIAGNOSIS — D123 Benign neoplasm of transverse colon: Secondary | ICD-10-CM | POA: Diagnosis not present

## 2024-03-14 DIAGNOSIS — K5730 Diverticulosis of large intestine without perforation or abscess without bleeding: Secondary | ICD-10-CM | POA: Insufficient documentation

## 2024-03-14 DIAGNOSIS — Z1211 Encounter for screening for malignant neoplasm of colon: Secondary | ICD-10-CM | POA: Diagnosis not present

## 2024-03-14 DIAGNOSIS — K219 Gastro-esophageal reflux disease without esophagitis: Secondary | ICD-10-CM | POA: Diagnosis not present

## 2024-03-14 DIAGNOSIS — Z86718 Personal history of other venous thrombosis and embolism: Secondary | ICD-10-CM | POA: Insufficient documentation

## 2024-03-14 DIAGNOSIS — I10 Essential (primary) hypertension: Secondary | ICD-10-CM | POA: Insufficient documentation

## 2024-03-14 DIAGNOSIS — K644 Residual hemorrhoidal skin tags: Secondary | ICD-10-CM | POA: Insufficient documentation

## 2024-03-14 DIAGNOSIS — F17290 Nicotine dependence, other tobacco product, uncomplicated: Secondary | ICD-10-CM | POA: Diagnosis not present

## 2024-03-14 DIAGNOSIS — D122 Benign neoplasm of ascending colon: Secondary | ICD-10-CM | POA: Insufficient documentation

## 2024-03-14 DIAGNOSIS — K635 Polyp of colon: Secondary | ICD-10-CM | POA: Diagnosis not present

## 2024-03-14 HISTORY — PX: COLONOSCOPY: SHX5424

## 2024-03-14 SURGERY — COLONOSCOPY
Anesthesia: General

## 2024-03-14 MED ORDER — LACTATED RINGERS IV SOLN: INTRAVENOUS | Status: DC

## 2024-03-14 MED ORDER — PROPOFOL 10 MG/ML IV BOLUS
INTRAVENOUS | Status: DC | PRN
  Administered 2024-03-14: 100 mg via INTRAVENOUS

## 2024-03-14 MED ORDER — LIDOCAINE 2% (20 MG/ML) 5 ML SYRINGE
INTRAMUSCULAR | Status: DC | PRN
Start: 2024-03-14 — End: 2024-03-14
  Administered 2024-03-14: 100 mg via INTRAVENOUS

## 2024-03-14 MED ORDER — PROPOFOL 500 MG/50ML IV EMUL
INTRAVENOUS | Status: DC | PRN
  Administered 2024-03-14: 200 ug/kg/min via INTRAVENOUS

## 2024-03-14 NOTE — Anesthesia Preprocedure Evaluation (Signed)
 Anesthesia Evaluation  Patient identified by MRN, date of birth, ID band Patient awake    Reviewed: Allergy & Precautions, H&P , NPO status , Patient's Chart, lab work & pertinent test results, reviewed documented beta blocker date and time   Airway Mallampati: IV  TM Distance: >3 FB Neck ROM: full    Dental no notable dental hx.    Pulmonary neg pulmonary ROS, Current Smoker   Pulmonary exam normal breath sounds clear to auscultation       Cardiovascular Exercise Tolerance: Good hypertension, negative cardio ROS  Rhythm:regular Rate:Normal     Neuro/Psych negative neurological ROS  negative psych ROS   GI/Hepatic negative GI ROS, Neg liver ROS,GERD  ,,  Endo/Other  negative endocrine ROS    Renal/GU negative Renal ROS  negative genitourinary   Musculoskeletal   Abdominal   Peds  Hematology negative hematology ROS (+)   Anesthesia Other Findings   Reproductive/Obstetrics negative OB ROS                              Anesthesia Physical Anesthesia Plan  ASA: 3  Anesthesia Plan: General   Post-op Pain Management:    Induction:   PONV Risk Score and Plan: Propofol  infusion  Airway Management Planned:   Additional Equipment:   Intra-op Plan:   Post-operative Plan:   Informed Consent: I have reviewed the patients History and Physical, chart, labs and discussed the procedure including the risks, benefits and alternatives for the proposed anesthesia with the patient or authorized representative who has indicated his/her understanding and acceptance.     Dental Advisory Given  Plan Discussed with: CRNA  Anesthesia Plan Comments:         Anesthesia Quick Evaluation

## 2024-03-14 NOTE — H&P (Signed)
 Brandon Burton is an 58 y.o. male.   Chief Complaint: history of colon polyps HPI: 58 year old male with past medical history of DVT, GERD and hypertension, coming for history of colon polyps.  The patient denies having any nausea, vomiting, fever, chills, hematochezia, melena, hematemesis, abdominal distention, abdominal pain, diarrhea, jaundice, pruritus or weight loss.  Colonoscopy 07/22/2015 with 7 polyps removed. Tubular adenomas and benign polyps on pathology.   No family history of colon cancer.  Last dose of Lovenox  was yesterday.  Past Medical History:  Diagnosis Date   DVT (deep venous thrombosis) (HCC)    GERD (gastroesophageal reflux disease)    Hypertension     Past Surgical History:  Procedure Laterality Date   COLONOSCOPY N/A 07/22/2015   SLF:7 polyps removed/mild diverticulosis/moderate internal hemorrhoids   COLONOSCOPY N/A 11/18/2018   Diverticulosis, hemorrhoids, otherwise normal. 2025 due.   EYE SURGERY Left    cataract removal   LUMBAR LAMINECTOMY N/A 12/10/2021   Procedure: LUMBAR RE-EXPLORATION;  Surgeon: Adah Acron, MD;  Location: [facility] OR;  Service: Orthopedics;  Laterality: N/A;   LUMBAR LAMINECTOMY/DECOMPRESSION MICRODISCECTOMY N/A 11/26/2021   Procedure: Lumbar three-four, Lumbar four-five Central Decompression, Lumbar Three-Four Microdiscectomhy;  Surgeon: Adah Acron, MD;  Location: [facility] OR;  Service: Orthopedics;  Laterality: N/A;   SHOULDER SURGERY Left 09/28/2013   fix previous clavicular fracture complication    Family History  Problem Relation Age of Onset   Thyroid cancer Mother    Heart attack Father    Colon cancer Neg Hx    Colon polyps Neg Hx    Social History:  reports that he has been smoking cigars. He has never used smokeless tobacco. He reports that he does not currently use alcohol. He reports current drug use. Drug: Marijuana.  Allergies: No Known Allergies  Medications Prior to Admission  Medication Sig Dispense Refill    amLODipine  (NORVASC ) 10 MG tablet Take 10 mg by mouth daily.     enoxaparin  (LOVENOX ) 120 MG/0.8ML injection Inject 0.8 mLs (120 mg total) into the skin daily. 8 mL 0   omeprazole  (PRILOSEC) 40 MG capsule TAKE 1 CAPSULE(40 MG) BY MOUTH DAILY 90 capsule 1   rosuvastatin (CRESTOR) 20 MG tablet Take 20 mg by mouth daily.     tadalafil (CIALIS) 20 MG tablet SMARTSIG:1 Tablet(s) By Mouth Every 36 Hours PRN     warfarin (COUMADIN ) 5 MG tablet TAKE 1 TO 2 TABLETS BY MOUTH DAILY AS DIRECTED BY COUMADIN  CLINIC 60 tablet 3    No results found for this or any previous visit (from the past 48 hours). No results found.  Review of Systems  All other systems reviewed and are negative.   Blood pressure (!) 137/95, pulse 71, temperature 97.8 F (36.6 C), resp. rate 19, SpO2 100%. Physical Exam  GENERAL: The patient is AO x3, in no acute distress. HEENT: Head is normocephalic and atraumatic. EOMI are intact. Mouth is well hydrated and without lesions. NECK: Supple. No masses LUNGS: Clear to auscultation. No presence of rhonchi/wheezing/rales. Adequate chest expansion HEART: RRR, normal s1 and s2. ABDOMEN: Soft, nontender, no guarding, no peritoneal signs, and nondistended. BS +. No masses. EXTREMITIES: Without any cyanosis, clubbing, rash, lesions or edema. NEUROLOGIC: AOx3, no focal motor deficit. SKIN: no jaundice, no rashes  Assessment/Plan 58 year old male with past medical history of DVT, GERD and hypertension, coming for history of colon polyps.  Will proceed with colonoscopy.  Urban Garden, MD 03/14/2024, 9:48 AM

## 2024-03-14 NOTE — Op Note (Signed)
 [facility] Patient Name: Brandon Burton Procedure Date: 03/14/2024 10:04 AM MRN: [id number] Date of Birth: 11-16-1965 Attending MD: Samantha Cress , , [id number] CSN: [id number] Age: 58 Admit Type: Outpatient Procedure:                Colonoscopy Indications:              Surveillance: Personal history of adenomatous                            polyps on last colonoscopy > 5 years ago Providers:                Samantha Cress, Pasco Bond, RN, Annell Barrow Referring MD:              Medicines:                Monitored Anesthesia Care Complications:            No immediate complications. Estimated Blood Loss:     Estimated blood loss: none. Procedure:                Pre-Anesthesia Assessment:                           - Prior to the procedure, a History and Physical                            was performed, and patient medications, allergies                            and sensitivities were reviewed. The patient's                            tolerance of previous anesthesia was reviewed.                           - The risks and benefits of the procedure and the                            sedation options and risks were discussed with the                            patient. All questions were answered and informed                            consent was obtained.                           - The risks and benefits of the procedure and the                            sedation options and risks were discussed with the                            patient. All questions were answered and informed  consent was obtained.                           - ASA Grade Assessment: III - A patient with severe                            systemic disease.                           After obtaining informed consent, the colonoscope                            was passed under direct vision. Throughout the                            procedure, the patient's blood pressure,  pulse, and                            oxygen saturations were monitored continuously. The                            [id number]-[id number] ([id number]) scope was introduced through                            the anus and advanced to the the cecum, identified                            by appendiceal orifice and ileocecal valve. The                            colonoscopy was performed without difficulty. The                            patient tolerated the procedure well. The quality                            of the bowel preparation was fair. Scope In: 10:17:47 AM Scope Out: 10:41:33 AM Scope Withdrawal Time: 0 hours 16 minutes 53 seconds  Total Procedure Duration: 0 hours 23 minutes 46 seconds  Findings:      Large external hemorrhoids were found on perianal exam.      Two sessile polyps were found in the transverse colon and ascending       colon. The polyps were 3 to 5 mm in size. These polyps were removed with       a cold snare. Resection and retrieval were complete.      Scattered medium-mouthed diverticula were found in the sigmoid colon.      Non-bleeding external and internal hemorrhoids were found during       retroflexion. Impression:               - Preparation of the colon was fair.                           - Hemorrhoids found on perianal exam.                           -  Two 3 to 5 mm polyps in the transverse colon and                            in the ascending colon, removed with a cold snare.                            Resected and retrieved.                           - Diverticulosis in the sigmoid colon.                           - Non-bleeding external and internal hemorrhoids. Moderate Sedation:      Per Anesthesia Care Recommendation:           - Discharge patient to home (ambulatory).                           - Resume previous diet.                           - Await pathology results.                           - Repeat colonoscopy in 3 years because the bowel                             preparation was suboptimal.                           - Restart warfarin tonight. Procedure Code(s):        --- Professional ---                           [phone], Colonoscopy, flexible; with removal of                            tumor(s), polyp(s), or other lesion(s) by snare                            technique Diagnosis Code(s):        --- Professional ---                           Z86.010, Personal history of colonic polyps                           K64.8, Other hemorrhoids                           D12.3, Benign neoplasm of transverse colon (hepatic                            flexure or splenic flexure)                           D12.2, Benign neoplasm of ascending colon  K57.30, Diverticulosis of large intestine without                            perforation or abscess without bleeding CPT copyright 2022 [employer]. All rights reserved. The codes documented in this report are preliminary and upon coder review may  be revised to meet current compliance requirements. Samantha Cress, MD Samantha Cress,  03/14/2024 10:47:26 AM This report has been signed electronically. Number of Addenda: 0

## 2024-03-14 NOTE — Transfer of Care (Signed)
 Immediate Anesthesia Transfer of Care Note  Patient: Brandon Burton  Procedure(s) Performed: COLONOSCOPY  Patient Location: PACU  Anesthesia Type:General  Level of Consciousness: awake  Airway & Oxygen Therapy: Patient Spontanous Breathing and Patient connected to face mask oxygen  Post-op Assessment: Report given to RN and Post -op Vital signs reviewed and stable  Post vital signs: Reviewed and stable  Last Vitals:  Vitals Value Taken Time  BP    Temp    Pulse    Resp    SpO2      Last Pain:  Vitals:   03/14/24 0930  PainSc: 0-No pain         Complications: No notable events documented.

## 2024-03-14 NOTE — Discharge Instructions (Signed)
 You are being discharged to home.  Resume your previous diet.  We are waiting for your pathology results.  Your physician has recommended a repeat colonoscopy in three years because the bowel preparation was suboptimal.  Restart warfarin tonight.

## 2024-03-14 NOTE — Anesthesia Procedure Notes (Signed)
 Date/Time: 03/14/2024 10:25 AM  Performed by: Sherwin Donate, CRNAPre-anesthesia Checklist: Patient identified, Emergency Drugs available, Suction available and Patient being monitored Patient Re-evaluated:Patient Re-evaluated prior to induction Oxygen Delivery Method: Nasal cannula Induction Type: IV induction Placement Confirmation: positive ETCO2 Comments: Optiflow High Flow [place] O2 used

## 2024-03-15 ENCOUNTER — Encounter (HOSPITAL_COMMUNITY): Payer: Self-pay | Admitting: Gastroenterology

## 2024-03-15 DIAGNOSIS — I10 Essential (primary) hypertension: Secondary | ICD-10-CM | POA: Diagnosis not present

## 2024-03-15 DIAGNOSIS — E785 Hyperlipidemia, unspecified: Secondary | ICD-10-CM | POA: Diagnosis not present

## 2024-03-16 ENCOUNTER — Ambulatory Visit (INDEPENDENT_AMBULATORY_CARE_PROVIDER_SITE_OTHER): Payer: Self-pay | Admitting: Gastroenterology

## 2024-03-16 LAB — SURGICAL PATHOLOGY

## 2024-03-18 NOTE — Anesthesia Postprocedure Evaluation (Signed)
 Anesthesia Post Note  Patient: Brandon Burton  Procedure(s) Performed: COLONOSCOPY  Patient location during evaluation: Phase II Anesthesia Type: General Level of consciousness: awake Pain management: pain level controlled Vital Signs Assessment: post-procedure vital signs reviewed and stable Respiratory status: spontaneous breathing and respiratory function stable Cardiovascular status: blood pressure returned to baseline and stable Postop Assessment: no headache and no apparent nausea or vomiting Anesthetic complications: no Comments: Late entry   No notable events documented.   Last Vitals:  Vitals:   03/14/24 1059 03/14/24 1100  BP: (!) 129/58 (!) 129/58  Pulse: 68 62  Resp: 18 19  Temp: 36.6 C   SpO2: 95% 92%    Last Pain:  Vitals:   03/14/24 1059  TempSrc: Oral  PainSc: 0-No pain                 Yvonna JINNY Bosworth

## 2024-03-20 ENCOUNTER — Ambulatory Visit: Attending: Internal Medicine | Admitting: *Deleted

## 2024-03-20 ENCOUNTER — Telehealth: Payer: Self-pay | Admitting: Radiology

## 2024-03-20 DIAGNOSIS — I82401 Acute embolism and thrombosis of unspecified deep veins of right lower extremity: Secondary | ICD-10-CM | POA: Diagnosis not present

## 2024-03-20 DIAGNOSIS — Z7901 Long term (current) use of anticoagulants: Secondary | ICD-10-CM | POA: Diagnosis not present

## 2024-03-20 DIAGNOSIS — Z9889 Other specified postprocedural states: Secondary | ICD-10-CM

## 2024-03-20 DIAGNOSIS — M5136 Other intervertebral disc degeneration, lumbar region with discogenic back pain only: Secondary | ICD-10-CM

## 2024-03-20 DIAGNOSIS — M48062 Spinal stenosis, lumbar region with neurogenic claudication: Secondary | ICD-10-CM

## 2024-03-20 LAB — POCT INR: INR: 1.7 — AB (ref 2.0–3.0)

## 2024-03-20 NOTE — Telephone Encounter (Signed)
 Order entered. I called patient's wife and advised. Please schedule follow up for MRI review with Dr. Georgina (new pt for him).

## 2024-03-20 NOTE — Telephone Encounter (Signed)
 Please see message from [facility] office below. Per Dr. Barbarann note in March, he wanted patient to follow up with you in 7 weeks, post PT, and would see if he possibly needed repeat imaging. Would you like for patient to see you first, or would you like for me to enter order for MRI since he has been to PT?  Please call wife concerning appointments. He does not need the appointment with Dr Margrette, but does need to see Dr. Georgina. She also stated that they though the was to get an MRI prior to that appointment . Looks to me like it was dependent on how PT went....   [phone]

## 2024-03-20 NOTE — Patient Instructions (Signed)
 S/P colonoscopy on 03/13/24.   Take warfarin 1 1/2 tablets tonight and tomorrow night then resume 1 tablet daily except 1/2 tablet on Thursdays Has 1 Lovenox  shot left.  Take it today then stop. Recheck on 03/27/24

## 2024-03-20 NOTE — Addendum Note (Signed)
 Addended by: TRINDA DEANE HERO on: 03/20/2024 04:03 PM   Modules accepted: Orders

## 2024-03-23 NOTE — Progress Notes (Signed)
 3 yr TCS noted in recall Patient result letter mailed procedure note and pathology result faxed to PCP

## 2024-03-24 ENCOUNTER — Encounter (INDEPENDENT_AMBULATORY_CARE_PROVIDER_SITE_OTHER): Payer: Self-pay | Admitting: *Deleted

## 2024-03-27 ENCOUNTER — Ambulatory Visit

## 2024-04-03 ENCOUNTER — Ambulatory Visit: Attending: Internal Medicine | Admitting: *Deleted

## 2024-04-03 DIAGNOSIS — I82401 Acute embolism and thrombosis of unspecified deep veins of right lower extremity: Secondary | ICD-10-CM | POA: Diagnosis not present

## 2024-04-03 DIAGNOSIS — Z7901 Long term (current) use of anticoagulants: Secondary | ICD-10-CM

## 2024-04-03 LAB — POCT INR: INR: 3 (ref 2.0–3.0)

## 2024-04-03 NOTE — Progress Notes (Signed)
Please see anticoagulation encounter.

## 2024-04-03 NOTE — Patient Instructions (Signed)
 S/P colonoscopy on 03/13/24.   Continue warfarin 1 tablet daily except 1/2 tablet on Thursdays Recheck in 4 wks

## 2024-04-10 ENCOUNTER — Ambulatory Visit: Payer: BC Managed Care – PPO | Admitting: Orthopedic Surgery

## 2024-04-27 ENCOUNTER — Telehealth: Payer: Self-pay | Admitting: Orthopedic Surgery

## 2024-04-27 NOTE — Telephone Encounter (Signed)
 Pt's wife Ellouise called to check the status of the mri referral. Ellouise would like for her number [phone] to be called when scheduling.

## 2024-05-01 ENCOUNTER — Encounter

## 2024-05-02 ENCOUNTER — Ambulatory Visit: Attending: Internal Medicine | Admitting: *Deleted

## 2024-05-02 DIAGNOSIS — Z7901 Long term (current) use of anticoagulants: Secondary | ICD-10-CM

## 2024-05-02 DIAGNOSIS — I82401 Acute embolism and thrombosis of unspecified deep veins of right lower extremity: Secondary | ICD-10-CM

## 2024-05-02 LAB — POCT INR: INR: 1.2 — AB (ref 2.0–3.0)

## 2024-05-02 NOTE — Progress Notes (Signed)
 INR 1.2; Please see anticoagulation encounter

## 2024-05-02 NOTE — Telephone Encounter (Signed)
 Authorization had run out, patient had been left messages with no return call. Received extended prior auth this morning. I called Ellouise, patient's wife and gave her the number to [employer] for [facility] to get scheduled at [facility].  She will call us  once she has his MRI scheduled so that return appointment can be made with Dr. Georgina for follow up. Patient will need 30 minute appt. It will be first visit with Dr. Georgina.

## 2024-05-02 NOTE — Patient Instructions (Signed)
 Take warfarin 2 tablets tonight, 1 1/2 tablets tomorrow night then resume 1 tablet daily except 1/2 tablet on Thursdays Recheck in 2 wks

## 2024-05-03 ENCOUNTER — Ambulatory Visit (HOSPITAL_COMMUNITY)
Admission: RE | Admit: 2024-05-03 | Discharge: 2024-05-03 | Disposition: A | Discharge status: Home / Self Care | Source: Ambulatory Visit | Attending: Orthopedic Surgery | Admitting: Orthopedic Surgery

## 2024-05-03 DIAGNOSIS — Z9889 Other specified postprocedural states: Secondary | ICD-10-CM | POA: Insufficient documentation

## 2024-05-03 DIAGNOSIS — M48062 Spinal stenosis, lumbar region with neurogenic claudication: Secondary | ICD-10-CM | POA: Diagnosis not present

## 2024-05-03 DIAGNOSIS — M545 Low back pain, unspecified: Secondary | ICD-10-CM

## 2024-05-03 DIAGNOSIS — M5136 Other intervertebral disc degeneration, lumbar region with discogenic back pain only: Secondary | ICD-10-CM | POA: Insufficient documentation

## 2024-05-16 ENCOUNTER — Ambulatory Visit: Attending: Internal Medicine | Admitting: *Deleted

## 2024-05-16 DIAGNOSIS — I82401 Acute embolism and thrombosis of unspecified deep veins of right lower extremity: Secondary | ICD-10-CM | POA: Diagnosis not present

## 2024-05-16 DIAGNOSIS — Z7901 Long term (current) use of anticoagulants: Secondary | ICD-10-CM

## 2024-05-16 LAB — POCT INR: INR: 2.1 (ref 2.0–3.0)

## 2024-05-16 NOTE — Patient Instructions (Signed)
Continue warfarin 1 tablet daily except 1/2 tablet on Thursdays Recheck in 4 wks

## 2024-05-16 NOTE — Progress Notes (Signed)
 INR 2.1; Please see anticoagulation encounter

## 2024-05-25 ENCOUNTER — Other Ambulatory Visit: Payer: Self-pay | Admitting: Internal Medicine

## 2024-05-25 DIAGNOSIS — I82531 Chronic embolism and thrombosis of right popliteal vein: Secondary | ICD-10-CM

## 2024-05-25 DIAGNOSIS — Z7901 Long term (current) use of anticoagulants: Secondary | ICD-10-CM

## 2024-06-08 ENCOUNTER — Ambulatory Visit (INDEPENDENT_AMBULATORY_CARE_PROVIDER_SITE_OTHER): Admitting: Orthopedic Surgery

## 2024-06-08 ENCOUNTER — Other Ambulatory Visit: Payer: Self-pay

## 2024-06-08 DIAGNOSIS — Z9889 Other specified postprocedural states: Secondary | ICD-10-CM

## 2024-06-08 DIAGNOSIS — M5126 Other intervertebral disc displacement, lumbar region: Secondary | ICD-10-CM | POA: Diagnosis not present

## 2024-06-08 NOTE — Progress Notes (Signed)
 Orthopedic Spine Surgery Office Note  Assessment: Patient is a 58 y.o. male with pain in multiple areas including his back, bilateral knees, and bilateral shoulders. No radicular type pain.    Plan: -Explained that initially conservative treatment is tried as a significant number of patients may experience relief with these treatment modalities. Discussed that the conservative treatments include:  -activity modification  -physical therapy  -over the counter pain medications  -medrol  dosepak  -lumbar steroid injections -Patient has tried Tylenol , ibuprofen, PT  -Patient does not have any radicular symptoms.  I explained that surgery can predictably relieve radicular pain but is not predictable at relieving back pain. For that reason, did not recommend any surgery at this time -Discussed injection as one possible treatment. He was interested in that so referral was provided to him today -Would need to be nicotine free prior to any elective spine surgery -Patient should return to office on an as needed basis   Patient expressed understanding of the plan and all questions were answered to the patient's satisfaction.   ___________________________________________________________________________   History:  Patient is a 58 y.o. male who presents today for lumbar spine.  Patient states that he has done well since his surgery with Dr. Barbarann.  He does not have any radiating leg pain.  He does have persistent back pain.  He has not noticed any significant changes in his back pain since surgery.  He has had difficulty working now as a result of his back pain and several other pains.  He also reports bilateral knee pain and bilateral shoulder pain.  He cannot lift more than 10 pounds without aggravating his pain.  He also needs to sit down regularly to help with the pain.  He has not been working recently as a result of his pain.  Denies bowel or bladder incontinence.  No saddle  anesthesia.  Treatments tried: Tylenol , ibuprofen, PT  Review of systems: Denies fevers and chills, night sweats, unexplained weight loss, history of cancer, pain that wakes them at night  Past medical history: HTN GERD History of DVT  Allergies: NKDA  Past surgical history:  Cataract surgery L3-S1 laminectomies  Social history: Reports use of nicotine product (smoking, vaping, patches, smokeless) Alcohol use: rare Denies recreational drug use   Physical Exam:  General: no acute distress, appears stated age Neurologic: alert, answering questions appropriately, following commands Respiratory: unlabored breathing on room air, symmetric chest rise Psychiatric: appropriate affect, normal cadence to speech   MSK (spine):  -Strength exam      Left  Right EHL    5/5  5/5 TA    5/5  5/5 GSC    4/5  4/5 Knee extension  5/5  5/5 Hip flexion   5/5  5/5  -Sensory exam    Sensation intact to light touch in L3-S1 nerve distributions of bilateral lower extremities  -Achilles DTR: 0/4 on the left, 0/4 on the right -Patellar tendon DTR: 0/4 on the left, 0/4 on the right  -Straight leg raise: Negative bilaterally -Clonus: no beats bilaterally  -Left hip exam: No pain through range of motion -Right hip exam: No pain through range of motion  Imaging: MRI of the lumbar spine from 05/03/2024 was independently reviewed and interpreted, showing central and paracentral disc herniation at L3/4 causing central and bilateral lateral recess stenosis at that level.  Right-sided foraminal stenosis at L3/4.  No other significant stenosis seen.  Laminectomy defects at L3/4, L4/5, L5/S1.   Patient name: Brandon Burton Patient  MRN: [id number] Date of visit: 06/08/24

## 2024-06-12 ENCOUNTER — Other Ambulatory Visit: Payer: Self-pay | Admitting: Physical Medicine and Rehabilitation

## 2024-06-12 MED ORDER — DIAZEPAM 5 MG PO TABS: ORAL_TABLET | ORAL | 0 refills | Status: AC

## 2024-06-13 ENCOUNTER — Ambulatory Visit: Attending: Internal Medicine | Admitting: *Deleted

## 2024-06-13 DIAGNOSIS — Z7901 Long term (current) use of anticoagulants: Secondary | ICD-10-CM

## 2024-06-13 DIAGNOSIS — I82401 Acute embolism and thrombosis of unspecified deep veins of right lower extremity: Secondary | ICD-10-CM | POA: Diagnosis not present

## 2024-06-13 LAB — POCT INR: INR: 2 (ref 2.0–3.0)

## 2024-06-13 NOTE — Progress Notes (Signed)
 INR 2.0 Please see anticoagulation encounter.

## 2024-06-13 NOTE — Patient Instructions (Signed)
Continue warfarin 1 tablet daily except 1/2 tablet on Thursdays Recheck in 4 wks

## 2024-07-04 ENCOUNTER — Other Ambulatory Visit: Payer: Self-pay

## 2024-07-04 ENCOUNTER — Ambulatory Visit: Admitting: Physical Medicine and Rehabilitation

## 2024-07-04 VITALS — BP 115/78 | HR 94

## 2024-07-04 DIAGNOSIS — M5416 Radiculopathy, lumbar region: Secondary | ICD-10-CM

## 2024-07-04 MED ORDER — METHYLPREDNISOLONE ACETATE 80 MG/ML IJ SUSP
40.0000 mg | Freq: Once | INTRAMUSCULAR | Status: AC
  Administered 2024-07-04: 40 mg

## 2024-07-04 NOTE — Procedures (Signed)
 Lumbosacral Transforaminal Epidural Steroid Injection - Sub-Pedicular Approach with Fluoroscopic Guidance  Patient: Brandon Burton      Date of Birth: [birth year] MRN: [id number] PCP: Sheryle Carwin, MD      Visit Date: 07/04/2024   Universal Protocol:    Date/Time: 07/04/2024  Consent Given By: the patient  Position: PRONE  Additional Comments: Vital signs were monitored before and after the procedure. Patient was prepped and draped in the usual sterile fashion. The correct patient, procedure, and site was verified.   Injection Procedure Details:   Procedure diagnoses: Lumbar radiculopathy [M54.16]    Meds Administered:  Meds ordered this encounter  Medications   methylPREDNISolone  acetate (DEPO-MEDROL ) injection 40 mg    Laterality: Right  Location/Site: L3  Needle:5.0 in., 22 ga.  Short bevel or Quincke spinal needle  Needle Placement: Transforaminal  Findings:    -Comments: Excellent flow of contrast along the nerve, nerve root and into the epidural space.  Procedure Details: After squaring off the end-plates to get a true AP view, the C-arm was positioned so that an oblique view of the foramen as noted above was visualized. The target area is just inferior to the nose of the scotty dog or sub pedicular. The soft tissues overlying this structure were infiltrated with 2-3 ml. of 1% Lidocaine  without Epinephrine .  The spinal needle was inserted toward the target using a trajectory view along the fluoroscope beam.  Under AP and lateral visualization, the needle was advanced so it did not puncture dura and was located close the 6 O'Clock position of the pedical in AP tracterory. Biplanar projections were used to confirm position. Aspiration was confirmed to be negative for CSF and/or blood. A 1-2 ml. volume of Isovue-250 was injected and flow of contrast was noted at each level. Radiographs were obtained for documentation purposes.   After attaining the desired flow of  contrast documented above, a 0.5 to 1.0 ml test dose of 0.25% Marcaine  was injected into each respective transforaminal space.  The patient was observed for 90 seconds post injection.  After no sensory deficits were reported, and normal lower extremity motor function was noted,   the above injectate was administered so that equal amounts of the injectate were placed at each foramen (level) into the transforaminal epidural space.   Additional Comments:  The patient tolerated the procedure well Dressing: 2 x 2 sterile gauze and Band-Aid    Post-procedure details: Patient was observed during the procedure. Post-procedure instructions were reviewed.  Patient left the clinic in stable condition.

## 2024-07-04 NOTE — Progress Notes (Signed)
 Brandon Burton - 58 y.o. male MRN [id number]  Date of birth: 1965-12-02  Office Visit Note: Visit Date: 07/04/2024 PCP: Sheryle Carwin, MD Referred by: Georgina Ozell LABOR, MD  Subjective: Chief Complaint  Patient presents with   Lower Back - Pain   HPI:  Brandon Burton is a 58 y.o. male who comes in today at the request of Dr. Ozell Georgina for planned Right L3-4 Lumbar Transforaminal epidural steroid injection with fluoroscopic guidance.  The patient has failed conservative care including home exercise, medications, time and activity modification.  This injection will be diagnostic and hopefully therapeutic.  Please see requesting physician notes for further details and justification. On Coumadin .   ROS Otherwise per HPI.  Assessment & Plan: Visit Diagnoses:    ICD-10-CM   1. Lumbar radiculopathy  M54.16 XR C-ARM NO REPORT    Epidural Steroid injection    methylPREDNISolone  acetate (DEPO-MEDROL ) injection 40 mg      Plan: No additional findings.   Meds & Orders:  Meds ordered this encounter  Medications   methylPREDNISolone  acetate (DEPO-MEDROL ) injection 40 mg    Orders Placed This Encounter  Procedures   XR C-ARM NO REPORT   Epidural Steroid injection    Follow-up: Return for visit to requesting provider as needed.   Procedures: No procedures performed  Lumbosacral Transforaminal Epidural Steroid Injection - Sub-Pedicular Approach with Fluoroscopic Guidance  Patient: Brandon Burton      Date of Birth: 11/07/1965 MRN: [id number] PCP: Sheryle Carwin, MD      Visit Date: 07/04/2024   Universal Protocol:    Date/Time: 07/04/2024  Consent Given By: the patient  Position: PRONE  Additional Comments: Vital signs were monitored before and after the procedure. Patient was prepped and draped in the usual sterile fashion. The correct patient, procedure, and site was verified.   Injection Procedure Details:   Procedure diagnoses: Lumbar radiculopathy [M54.16]    Meds  Administered:  Meds ordered this encounter  Medications   methylPREDNISolone  acetate (DEPO-MEDROL ) injection 40 mg    Laterality: Right  Location/Site: L3  Needle:5.0 in., 22 ga.  Short bevel or Quincke spinal needle  Needle Placement: Transforaminal  Findings:    -Comments: Excellent flow of contrast along the nerve, nerve root and into the epidural space.  Procedure Details: After squaring off the end-plates to get a true AP view, the C-arm was positioned so that an oblique view of the foramen as noted above was visualized. The target area is just inferior to the nose of the scotty dog or sub pedicular. The soft tissues overlying this structure were infiltrated with 2-3 ml. of 1% Lidocaine  without Epinephrine .  The spinal needle was inserted toward the target using a trajectory view along the fluoroscope beam.  Under AP and lateral visualization, the needle was advanced so it did not puncture dura and was located close the 6 O'Clock position of the pedical in AP tracterory. Biplanar projections were used to confirm position. Aspiration was confirmed to be negative for CSF and/or blood. A 1-2 ml. volume of Isovue-250 was injected and flow of contrast was noted at each level. Radiographs were obtained for documentation purposes.   After attaining the desired flow of contrast documented above, a 0.5 to 1.0 ml test dose of 0.25% Marcaine  was injected into each respective transforaminal space.  The patient was observed for 90 seconds post injection.  After no sensory deficits were reported, and normal lower extremity motor function was noted,   the above injectate was administered so  that equal amounts of the injectate were placed at each foramen (level) into the transforaminal epidural space.   Additional Comments:  The patient tolerated the procedure well Dressing: 2 x 2 sterile gauze and Band-Aid    Post-procedure details: Patient was observed during the procedure. Post-procedure  instructions were reviewed.  Patient left the clinic in stable condition.    Clinical History: No specialty comments available.     Objective:  VS:  HT:    WT:   BMI:     BP:115/78  HR:94bpm  TEMP: ( )  RESP:  Physical Exam Vitals and nursing note reviewed.  Constitutional:      General: He is not in acute distress.    Appearance: Normal appearance. He is obese. He is not ill-appearing.  HENT:     Head: Normocephalic and atraumatic.     Right Ear: External ear normal.     Left Ear: External ear normal.     Nose: No congestion.  Eyes:     Extraocular Movements: Extraocular movements intact.  Cardiovascular:     Rate and Rhythm: Normal rate.     Pulses: Normal pulses.  Pulmonary:     Effort: Pulmonary effort is normal. No respiratory distress.  Abdominal:     General: There is no distension.     Palpations: Abdomen is soft.  Musculoskeletal:        General: No tenderness or signs of injury.     Cervical back: Neck supple.     Right lower leg: No edema.     Left lower leg: No edema.     Comments: Patient has good distal strength without clonus.  Skin:    Findings: No erythema or rash.  Neurological:     General: No focal deficit present.     Mental Status: He is alert and oriented to person, place, and time.     Sensory: No sensory deficit.     Motor: No weakness or abnormal muscle tone.     Coordination: Coordination normal.  Psychiatric:        Mood and Affect: Mood normal.        Behavior: Behavior normal.      Imaging: No results found.

## 2024-07-04 NOTE — Progress Notes (Signed)
 Pain Scale   Average Pain 5 Patient advising he has chronic lower back pain radiating to right leg pain increases when walking and decreases when laying down on flat area.         +Driver, -BT, -Dye Allergies.

## 2024-07-11 ENCOUNTER — Ambulatory Visit: Attending: Internal Medicine | Admitting: *Deleted

## 2024-07-11 DIAGNOSIS — Z7901 Long term (current) use of anticoagulants: Secondary | ICD-10-CM | POA: Diagnosis not present

## 2024-07-11 DIAGNOSIS — I82401 Acute embolism and thrombosis of unspecified deep veins of right lower extremity: Secondary | ICD-10-CM | POA: Diagnosis not present

## 2024-07-11 LAB — POCT INR: INR: 2.7 (ref 2.0–3.0)

## 2024-07-11 NOTE — Patient Instructions (Signed)
Continue warfarin 1 tablet daily except 1/2 tablet on Thursdays Recheck in 6 wks 

## 2024-07-11 NOTE — Progress Notes (Signed)
 INR 2.7; Please see anticoagulation encounter

## 2024-08-02 ENCOUNTER — Encounter (INDEPENDENT_AMBULATORY_CARE_PROVIDER_SITE_OTHER): Payer: Self-pay | Admitting: Gastroenterology

## 2024-08-22 ENCOUNTER — Ambulatory Visit: Attending: Internal Medicine | Admitting: *Deleted

## 2024-08-22 DIAGNOSIS — I82401 Acute embolism and thrombosis of unspecified deep veins of right lower extremity: Secondary | ICD-10-CM

## 2024-08-22 DIAGNOSIS — Z7901 Long term (current) use of anticoagulants: Secondary | ICD-10-CM

## 2024-08-22 LAB — POCT INR: INR: 2.7 (ref 2.0–3.0)

## 2024-08-22 NOTE — Patient Instructions (Signed)
Continue warfarin 1 tablet daily except 1/2 tablet on Thursdays Recheck in 6 wks 

## 2024-08-22 NOTE — Progress Notes (Signed)
 INR 2.7; Please see anticoagulation encounter

## 2024-10-02 ENCOUNTER — Ambulatory Visit: Attending: Internal Medicine | Admitting: *Deleted

## 2024-10-02 DIAGNOSIS — Z7901 Long term (current) use of anticoagulants: Secondary | ICD-10-CM | POA: Diagnosis not present

## 2024-10-02 DIAGNOSIS — I82401 Acute embolism and thrombosis of unspecified deep veins of right lower extremity: Secondary | ICD-10-CM

## 2024-10-02 LAB — POCT INR: INR: 2.1 (ref 2.0–3.0)

## 2024-10-02 NOTE — Progress Notes (Signed)
 INR 2.1; Please see anticoagulation encounter

## 2024-10-02 NOTE — Patient Instructions (Signed)
Continue warfarin 1 tablet daily except 1/2 tablet on Thursdays Recheck in 6 wks 

## 2024-11-13 ENCOUNTER — Ambulatory Visit
